# Patient Record
Sex: Female | Born: 1935
Health system: Southern US, Community
[De-identification: ages and names within clinical notes are randomized; demographics above are authoritative.]

## PROBLEM LIST (undated history)

## (undated) DIAGNOSIS — N289 Disorder of kidney and ureter, unspecified: Secondary | ICD-10-CM

## (undated) DIAGNOSIS — M353 Polymyalgia rheumatica: Secondary | ICD-10-CM

## (undated) DIAGNOSIS — E7800 Pure hypercholesterolemia, unspecified: Secondary | ICD-10-CM

## (undated) DIAGNOSIS — E119 Type 2 diabetes mellitus without complications: Secondary | ICD-10-CM

## (undated) DIAGNOSIS — I10 Essential (primary) hypertension: Secondary | ICD-10-CM

## (undated) HISTORY — DX: Pure hypercholesterolemia, unspecified: E78.00

## (undated) HISTORY — DX: Type 2 diabetes mellitus without complications: E11.9

## (undated) HISTORY — PX: ECTOPIC PREGNANCY SURGERY: SHX613

## (undated) HISTORY — DX: Polymyalgia rheumatica: M35.3

## (undated) HISTORY — PX: HERNIA REPAIR: SHX51

---

## 1997-08-07 ENCOUNTER — Other Ambulatory Visit: Admission: RE | Admit: 1997-08-07 | Discharge: 1997-08-07 | Payer: Self-pay | Admitting: Nephrology

## 1997-11-23 ENCOUNTER — Other Ambulatory Visit: Admission: RE | Admit: 1997-11-23 | Discharge: 1997-11-23 | Payer: Self-pay | Admitting: Nephrology

## 1999-03-10 ENCOUNTER — Emergency Department (HOSPITAL_COMMUNITY): Admission: EM | Admit: 1999-03-10 | Discharge: 1999-03-10 | Payer: Self-pay | Admitting: Emergency Medicine

## 1999-03-10 ENCOUNTER — Encounter: Payer: Self-pay | Admitting: Emergency Medicine

## 2000-11-06 ENCOUNTER — Other Ambulatory Visit: Admission: RE | Admit: 2000-11-06 | Discharge: 2000-11-06 | Payer: Self-pay | Admitting: Internal Medicine

## 2003-11-05 ENCOUNTER — Ambulatory Visit (HOSPITAL_COMMUNITY): Admission: RE | Admit: 2003-11-05 | Discharge: 2003-11-05 | Payer: Self-pay | Admitting: General Surgery

## 2004-03-04 ENCOUNTER — Ambulatory Visit (HOSPITAL_COMMUNITY): Admission: RE | Admit: 2004-03-04 | Discharge: 2004-03-04 | Payer: Self-pay | Admitting: Gastroenterology

## 2004-03-04 ENCOUNTER — Encounter (INDEPENDENT_AMBULATORY_CARE_PROVIDER_SITE_OTHER): Payer: Self-pay | Admitting: *Deleted

## 2004-08-09 ENCOUNTER — Encounter: Admission: RE | Admit: 2004-08-09 | Discharge: 2004-08-09 | Payer: Self-pay | Admitting: Internal Medicine

## 2004-09-30 ENCOUNTER — Ambulatory Visit (HOSPITAL_COMMUNITY): Admission: RE | Admit: 2004-09-30 | Discharge: 2004-09-30 | Payer: Self-pay | Admitting: Urology

## 2005-08-26 ENCOUNTER — Encounter: Admission: RE | Admit: 2005-08-26 | Discharge: 2005-08-26 | Payer: Self-pay | Admitting: Internal Medicine

## 2005-08-31 ENCOUNTER — Encounter: Admission: RE | Admit: 2005-08-31 | Discharge: 2005-08-31 | Payer: Self-pay | Admitting: Internal Medicine

## 2009-10-22 ENCOUNTER — Encounter: Admission: RE | Admit: 2009-10-22 | Discharge: 2009-10-22 | Payer: Self-pay | Admitting: Internal Medicine

## 2010-09-10 NOTE — Op Note (Signed)
NAME:  Rebekah Avila, Rebekah Avila NO.:  [id number]   MEDICAL RECORD NO.:  [id number]                   PATIENT TYPE:  OIB   LOCATION:  2899                                 FACILITY:  [facility]   PHYSICIAN:  Leonie Man, M.D.                DATE OF BIRTH:  07/14/1931   DATE OF PROCEDURE:  11/05/2003  DATE OF DISCHARGE:                                 OPERATIVE REPORT   PREOPERATIVE DIAGNOSIS:  Incarcerated epigastric hernia.   POSTOPERATIVE DIAGNOSIS:  Diastasis recti.   PROCEDURE:  Exploration of epigastric mass and repair of diastasis recti.   SURGEON:  Leonie Man, M.D.   ASSISTANT:  Nurse.   ANESTHESIA:  General.   INDICATIONS FOR PROCEDURE:  The patient is a 77 year old woman with  polymyalgia rheumatica.  She has been on some low dose prednisone in the  past.  She comes in with epigastric pain and a mass effect in the  epigastrium.  This feels like an incarcerated epigastric hernia and she  comes to the operating room for exploration.  She understands the risks and  potential benefits of surgery and gives consent.   DESCRIPTION OF PROCEDURE:  Following the induction of satisfactory general  anesthesia, the patient was positioned supinely.  The abdomen is prepped and  draped routinely and a midline incision is carried down over the epigastric  mass, deepened through the skin and subcutaneous tissues, and going down  there was no fat necrosis or any evidence of hernia sac.  The fascia was  somewhat weakened and distended inferior to the xiphoid and down to the  umbilicus.  There was no specific hernia noted.  I used interrupted #1  Novafil sutures to repair the diastasis by imbrication.  The sponge and  instrument counts were then verified and the subcutaneous tissues closed  with interrupted 0 Vicryl sutures.  The skin closed with a running 4-0  Monocryl suture and then reinforced with Steri-Strips.  A sterile dressing  is applied.  Anesthetic  reversed.  The patient removed from the operating  room to the recovery room in stable condition.  She tolerated this procedure  well.                                               Leonie Man, M.D.    PB/MEDQ  D:  11/05/2003  T:  11/05/2003  Job:  [id number]

## 2010-09-10 NOTE — Op Note (Signed)
Rebekah Avila, Rebekah Avila                 ACCOUNT NO.:  [id number]   MEDICAL RECORD NO.:  [id number]          PATIENT TYPE:  AMB   LOCATION:  ENDO                         FACILITY:  [facility]   PHYSICIAN:  Jordan Hawks. Elnoria Howard, MD    DATE OF BIRTH:  1931-09-01   DATE OF PROCEDURE:  03/04/2004  DATE OF DISCHARGE:                                 OPERATIVE REPORT   PROCEDURE:  Colonoscopy.   INDICATIONS FOR PROCEDURE:  Screening colonoscopy.   ENDOSCOPIST:  Jordan Hawks. Elnoria Howard, M.D.   EQUIPMENT USED:  Adult Olympus colonoscopy.   CONSENT:  Informed consent was obtained from the patient describing the  risks of bleeding, infection, perforation, medication reactions and a 10%  miss rate for a small colon cancer, a polyp and the risk of death; all of  which are not exclusive of any other complications that can occur.   PHYSICAL EXAMINATION:  CARDIOVASCULAR:  Regular rate and rhythm.  LUNGS:  Clear to auscultation bilaterally.  ABDOMEN:  Obese, soft, nontender and nondistended.   MEDICATIONS:  Versed 7.5 mg IV and Demerol 75 mg IV.   DESCRIPTION OF PROCEDURE:  After adequate sedation was achieved while in the  left lateral decubitus position, a rectal examination was performed which  was negative for any palpable abnormalities.  The colonoscope was then  introduced and advanced to the cecum under direct visualization.  This was a  moderately difficult colon to intubate the cecum.  The patient was noted to  have a good prep.  While in the cecum, photo documentation was obtained of  this area.  The patient was noted to have a 3 mm cecal polyp and then a  second polyp on the IC valve also measuring 3 mm, both of which were removed  and cold biopsied.  Upon withdrawal of the colonoscopy on close inspection  of the mucosa, there is no evidence of any polyps, masses, ulcerations,  inflammations, erosions or vascular abnormalities in the ascending,  transverse, descending or the sigmoid colon.  The patient is  noted to have  large diverticula in the transverse, descending and sigmoid colon.  Upon  retroflexion in the rectum, the patient is documented to have moderate size  internal and external hemorrhoids.   PLAN:  1.  Follow up on biopsies.  2.  Repeat colonoscopy in five years.  3.  Maintain high fiber diet.       PDH/MEDQ  D:  03/04/2004  T:  03/04/2004  Job:  [id number]   cc:   Candyce Churn. Allyne Gee, M.D.  [address]  [address]  [place]  [place] [address]  Fax: [phone]

## 2013-06-30 ENCOUNTER — Emergency Department (HOSPITAL_COMMUNITY): Payer: Medicare Other

## 2013-06-30 ENCOUNTER — Encounter (HOSPITAL_COMMUNITY): Payer: Self-pay | Admitting: Emergency Medicine

## 2013-06-30 ENCOUNTER — Inpatient Hospital Stay (HOSPITAL_COMMUNITY)
Admission: EM | Admit: 2013-06-30 | Discharge: 2013-07-01 | DRG: 556 | Disposition: A | Discharge status: Home / Self Care | Payer: Medicare Other | Attending: Internal Medicine | Admitting: Internal Medicine

## 2013-06-30 DIAGNOSIS — I10 Essential (primary) hypertension: Secondary | ICD-10-CM | POA: Diagnosis present

## 2013-06-30 DIAGNOSIS — R29898 Other symptoms and signs involving the musculoskeletal system: Principal | ICD-10-CM | POA: Diagnosis present

## 2013-06-30 DIAGNOSIS — G459 Transient cerebral ischemic attack, unspecified: Secondary | ICD-10-CM | POA: Diagnosis present

## 2013-06-30 DIAGNOSIS — R4789 Other speech disturbances: Secondary | ICD-10-CM | POA: Diagnosis present

## 2013-06-30 DIAGNOSIS — Z79899 Other long term (current) drug therapy: Secondary | ICD-10-CM

## 2013-06-30 DIAGNOSIS — E876 Hypokalemia: Secondary | ICD-10-CM | POA: Diagnosis present

## 2013-06-30 DIAGNOSIS — Z7982 Long term (current) use of aspirin: Secondary | ICD-10-CM

## 2013-06-30 HISTORY — DX: Essential (primary) hypertension: I10

## 2013-06-30 LAB — COMPREHENSIVE METABOLIC PANEL
ALT: 9 U/L (ref 0–35)
AST: 22 U/L (ref 0–37)
Albumin: 3.9 g/dL (ref 3.5–5.2)
Alkaline Phosphatase: 48 U/L (ref 39–117)
BUN: 12 mg/dL (ref 6–23)
CO2: 25 mEq/L (ref 19–32)
Calcium: 9.7 mg/dL (ref 8.4–10.5)
Chloride: 101 mEq/L (ref 96–112)
Creatinine, Ser: 0.98 mg/dL (ref 0.50–1.10)
GFR calc Af Amer: 62 mL/min — ABNORMAL LOW (ref >90)
GFR calc non Af Amer: 53 mL/min — ABNORMAL LOW (ref >90)
Glucose, Bld: 116 mg/dL — ABNORMAL HIGH (ref 70–99)
Potassium: 3.1 mEq/L — ABNORMAL LOW (ref 3.7–5.3)
Sodium: 141 mEq/L (ref 137–147)
Total Bilirubin: 0.5 mg/dL (ref 0.3–1.2)
Total Protein: 8 g/dL (ref 6.0–8.3)

## 2013-06-30 LAB — URINALYSIS, ROUTINE W REFLEX MICROSCOPIC
Bilirubin Urine: NEGATIVE
Glucose, UA: NEGATIVE mg/dL
Hgb urine dipstick: NEGATIVE
Ketones, ur: NEGATIVE mg/dL
Nitrite: NEGATIVE
Protein, ur: NEGATIVE mg/dL
Specific Gravity, Urine: 1.02 (ref 1.005–1.030)
Urobilinogen, UA: 0.2 mg/dL (ref 0.0–1.0)
pH: 6.5 (ref 5.0–8.0)

## 2013-06-30 LAB — RAPID URINE DRUG SCREEN, HOSP PERFORMED
Amphetamines: NOT DETECTED
Barbiturates: NOT DETECTED
Benzodiazepines: NOT DETECTED
Cocaine: NOT DETECTED
Opiates: NOT DETECTED
Tetrahydrocannabinol: NOT DETECTED

## 2013-06-30 LAB — CBC
HCT: 33 % — ABNORMAL LOW (ref 36.0–46.0)
Hemoglobin: 11.4 g/dL — ABNORMAL LOW (ref 12.0–15.0)
MCH: 29.8 pg (ref 26.0–34.0)
MCHC: 34.5 g/dL (ref 30.0–36.0)
MCV: 86.2 fL (ref 78.0–100.0)
Platelets: 262 10*3/uL (ref 150–400)
RBC: 3.83 MIL/uL — ABNORMAL LOW (ref 3.87–5.11)
RDW: 13.3 % (ref 11.5–15.5)
WBC: 5.4 10*3/uL (ref 4.0–10.5)

## 2013-06-30 LAB — URINE MICROSCOPIC-ADD ON

## 2013-06-30 LAB — I-STAT TROPONIN, ED: Troponin i, poc: 0.01 ng/mL (ref 0.00–0.08)

## 2013-06-30 MED ORDER — HYDROCHLOROTHIAZIDE 25 MG PO TABS
25.0000 mg | ORAL_TABLET | Freq: Every day | ORAL | Status: DC
  Administered 2013-07-01: 25 mg via ORAL
  Filled 2013-06-30 (×2): qty 1

## 2013-06-30 MED ORDER — HEPARIN SODIUM (PORCINE) 5000 UNIT/ML IJ SOLN
5000.0000 [IU] | Freq: Three times a day (TID) | INTRAMUSCULAR | Status: DC
  Administered 2013-06-30 – 2013-07-01 (×2): 5000 [IU] via SUBCUTANEOUS
  Filled 2013-06-30 (×5): qty 1

## 2013-06-30 MED ORDER — ACETAMINOPHEN 325 MG PO TABS
650.0000 mg | ORAL_TABLET | Freq: Four times a day (QID) | ORAL | Status: DC | PRN
  Administered 2013-06-30: 650 mg via ORAL
  Filled 2013-06-30: qty 2

## 2013-06-30 MED ORDER — ONDANSETRON HCL 4 MG/2ML IJ SOLN: 4.0000 mg | Freq: Four times a day (QID) | INTRAMUSCULAR | Status: DC | PRN

## 2013-06-30 MED ORDER — ACETAMINOPHEN 650 MG RE SUPP
650.0000 mg | Freq: Four times a day (QID) | RECTAL | Status: DC | PRN
Start: 2013-06-30 — End: 2013-07-01

## 2013-06-30 MED ORDER — IRBESARTAN 75 MG PO TABS
75.0000 mg | ORAL_TABLET | Freq: Every day | ORAL | Status: DC
  Administered 2013-06-30 – 2013-07-01 (×2): 75 mg via ORAL
  Filled 2013-06-30 (×2): qty 1

## 2013-06-30 MED ORDER — POLYETHYLENE GLYCOL 3350 17 G PO PACK
17.0000 g | PACK | Freq: Every day | ORAL | Status: DC | PRN
  Filled 2013-06-30: qty 1

## 2013-06-30 MED ORDER — SODIUM CHLORIDE 0.9 % IJ SOLN
3.0000 mL | Freq: Two times a day (BID) | INTRAMUSCULAR | Status: DC
  Administered 2013-06-30 – 2013-07-01 (×3): 3 mL via INTRAVENOUS

## 2013-06-30 MED ORDER — ASPIRIN EC 325 MG PO TBEC
325.0000 mg | DELAYED_RELEASE_TABLET | Freq: Every day | ORAL | Status: DC
  Administered 2013-06-30 – 2013-07-01 (×2): 325 mg via ORAL
  Filled 2013-06-30 (×2): qty 1

## 2013-06-30 MED ORDER — OMEGA-3-ACID ETHYL ESTERS 1 G PO CAPS
1.0000 g | ORAL_CAPSULE | Freq: Every day | ORAL | Status: DC
  Administered 2013-06-30 – 2013-07-01 (×2): 1 g via ORAL
  Filled 2013-06-30 (×2): qty 1

## 2013-06-30 MED ORDER — POTASSIUM CHLORIDE CRYS ER 20 MEQ PO TBCR
40.0000 meq | EXTENDED_RELEASE_TABLET | Freq: Two times a day (BID) | ORAL | Status: AC
  Administered 2013-06-30 (×2): 40 meq via ORAL
  Filled 2013-06-30 (×2): qty 2

## 2013-06-30 MED ORDER — METOPROLOL TARTRATE 50 MG PO TABS
50.0000 mg | ORAL_TABLET | Freq: Two times a day (BID) | ORAL | Status: DC
  Administered 2013-06-30 – 2013-07-01 (×3): 50 mg via ORAL
  Filled 2013-06-30 (×4): qty 1

## 2013-06-30 MED ORDER — FISH OIL OIL: 1.0000 | TOPICAL_OIL | Freq: Every day | Status: DC

## 2013-06-30 MED ORDER — FLUTICASONE PROPIONATE 50 MCG/ACT NA SUSP: 1.0000 | Freq: Every day | NASAL | Status: DC | PRN

## 2013-06-30 MED ORDER — ONDANSETRON HCL 4 MG PO TABS: 4.0000 mg | ORAL_TABLET | Freq: Four times a day (QID) | ORAL | Status: DC | PRN

## 2013-06-30 MED ORDER — ATORVASTATIN CALCIUM 20 MG PO TABS
20.0000 mg | ORAL_TABLET | Freq: Every day | ORAL | Status: DC
Start: 2013-06-30 — End: 2013-07-01
  Administered 2013-06-30: 20 mg via ORAL
  Filled 2013-06-30 (×2): qty 1

## 2013-06-30 NOTE — ED Notes (Signed)
Attempt to call report, nurse with return call

## 2013-06-30 NOTE — ED Notes (Signed)
Pt returns to room 

## 2013-06-30 NOTE — ED Notes (Signed)
Lab bedside.

## 2013-06-30 NOTE — ED Provider Notes (Addendum)
CSN: [id number]     Arrival date & time 06/30/13  1207 History   First MD Initiated Contact with Patient 06/30/13 1224     Chief Complaint  Patient presents with  . Numbness     (Consider location/radiation/quality/duration/timing/severity/associated sxs/prior Treatment) Patient is a 80 y.o. female presenting with weakness. The history is provided by the patient.  Weakness This is a new problem. The current episode started 1 to 2 hours ago. The problem occurs constantly. The problem has been resolved. Pertinent negatives include no chest pain, no headaches and no shortness of breath. Associated symptoms comments: Pt states when she woke up this a.m. She had left arm numbness, left face numbness and her husband told her she had left facial droop and mild slurred speech.  Denies gait issues and no c/o of aphasia.  Also noted today while this was happening that blood pressure has been elevated.  No change in meds and has taken all bp meds.. Nothing aggravates the symptoms. Nothing relieves the symptoms. She has tried nothing for the symptoms. The treatment provided significant relief.    Past Medical History  Diagnosis Date  . Hypertension    Past Surgical History  Procedure Laterality Date  . Hernia repair    . Ectopic pregnancy surgery     No family history on file. History  Substance Use Topics  . Smoking status: Never Smoker   . Smokeless tobacco: Not on file  . Alcohol Use: No   OB History   Grav Para Term Preterm Abortions TAB SAB Ect Mult Living                 Review of Systems  Respiratory: Negative for cough and shortness of breath.   Cardiovascular: Negative for chest pain and leg swelling.  Neurological: Positive for facial asymmetry, speech difficulty, weakness and numbness. Negative for headaches.       Slurred speech  All other systems reviewed and are negative.      Allergies  Morphine and related  Home Medications   Current Outpatient Rx  Name  Route   Sig  Dispense  Refill  . acetaminophen (TYLENOL) 500 MG tablet   Oral   Take 1,000 mg by mouth every 6 (six) hours as needed for moderate pain.         Marland Kitchen. aspirin EC 81 MG tablet   Oral   Take 81 mg by mouth daily.         . Fish Oil OIL   Oral   Take 1 capsule by mouth daily.         . fluticasone (FLONASE) 50 MCG/ACT nasal spray   Each Nare   Place 1 spray into both nostrils daily as needed for allergies or rhinitis.         . hydrochlorothiazide (HYDRODIURIL) 25 MG tablet   Oral   Take 25 mg by mouth daily.         . metoprolol (LOPRESSOR) 50 MG tablet   Oral   Take 50 mg by mouth 2 (two) times daily.         Marland Kitchen. olmesartan (BENICAR) 40 MG tablet   Oral   Take 40 mg by mouth daily.         . rosuvastatin (CRESTOR) 20 MG tablet   Oral   Take 20 mg by mouth every Wednesday.          BP 175/88  Pulse 73  Temp(Src) 98.3 F (36.8 C) (Oral)  Resp  16  SpO2 97% Physical Exam  Nursing note and vitals reviewed. Constitutional: She is oriented to person, place, and time. She appears well-developed and well-nourished. No distress.  HENT:  Head: Normocephalic and atraumatic.  Mouth/Throat: Oropharynx is clear and moist.  Eyes: Conjunctivae and EOM are normal. Pupils are equal, round, and reactive to light.  Neck: Normal range of motion. Neck supple. Carotid bruit is not present.  Cardiovascular: Normal rate, regular rhythm and intact distal pulses.   No murmur heard. Normal pulses in all ext  Pulmonary/Chest: Effort normal and breath sounds normal. No respiratory distress. She has no wheezes. She has no rales.  Abdominal: Soft. She exhibits no distension. There is no tenderness. There is no rebound and no guarding.  Musculoskeletal: Normal range of motion. She exhibits no edema and no tenderness.  Neurological: She is alert and oriented to person, place, and time. She has normal strength. No cranial nerve deficit or sensory deficit. Coordination and gait  normal.  Normal speech and movements are purposeful.  Normal finger to nose and no visual field cuts.    Skin: Skin is warm and dry. No rash noted. No erythema.  Psychiatric: She has a normal mood and affect. Her behavior is normal.    ED Course  Procedures (including critical care time) Labs Review Labs Reviewed  CBC - Abnormal; Notable for the following:    RBC 3.83 (*)    Hemoglobin 11.4 (*)    HCT 33.0 (*)    All other components within normal limits  COMPREHENSIVE METABOLIC PANEL - Abnormal; Notable for the following:    Potassium 3.1 (*)    Glucose, Bld 116 (*)    GFR calc non Af Amer 53 (*)    GFR calc Af Amer 62 (*)    All other components within normal limits  URINALYSIS, ROUTINE W REFLEX MICROSCOPIC - Abnormal; Notable for the following:    Leukocytes, UA MODERATE (*)    All other components within normal limits  URINE RAPID DRUG SCREEN (HOSP PERFORMED)  URINE MICROSCOPIC-ADD ON  Rosezena Sensor, ED   Imaging Review Dg Chest 2 View  06/30/2013   CLINICAL DATA:  Recent dyspnea and chest pain  EXAM: CHEST  2 VIEW  COMPARISON:  DG CHEST 2 VIEW dated 09/29/2004  FINDINGS: The lungs are adequately inflated. There is no focal infiltrate. No pulmonary parenchymal nodules or masses are demonstrated. The cardiopericardial silhouette is top-normal in size. The pulmonary vascularity is not engorged. The mediastinum is normal in width. There is no pleural effusion or pneumothorax. The observed portions of the bony thorax exhibit no acute abnormalities.  IMPRESSION: There is no evidence of active cardiopulmonary disease.   Electronically Signed   By: David  [place]   On: 06/30/2013 13:22   Ct Head Wo Contrast  06/30/2013   CLINICAL DATA:  Left arm numbness.  EXAM: CT HEAD WITHOUT CONTRAST  TECHNIQUE: Contiguous axial images were obtained from the base of the skull through the vertex without intravenous contrast.  COMPARISON:  None.  FINDINGS: Mild low-density throughout the deep white  matter compatible with chronic microvascular disease. No acute intracranial abnormality. Specifically, no hemorrhage, hydrocephalus, mass lesion, acute infarction, or significant intracranial injury. No acute calvarial abnormality. Visualized paranasal sinuses and mastoids clear. Orbital soft tissues unremarkable.  IMPRESSION: Mild chronic small vessel disease.  No acute intracranial abnormality.   Electronically Signed   By: Charlett Nose M.D.   On: 06/30/2013 13:10     EKG Interpretation   Date/Time:  Sunday June 30 2013 12:55:40 EDT Ventricular Rate:  70 PR Interval:  207 QRS Duration: 75 QT Interval:  400 QTC Calculation: 432 R Axis:   11 Text Interpretation:  Sinus rhythm Low voltage, precordial leads  Nonspecific T abnormalities, anterior leads Baseline wander in lead(s) V6  No significant change since last tracing Confirmed by Anitra Lauth  MD,  Alphonzo Lemmings ([id number]) on 06/30/2013 1:01:01 PM      MDM   Final diagnoses:  TIA (transient ischemic attack)   Patient with a history today is concerning for a TIA. She is complaining of left arm numbness, heaviness and left-sided facial droop with slurred speech that lasted less than 1 hour. Patient noted to have high blood pressure today. She states her blood pressure typically runs between 05/25/1936 and today has been 170-190. She currently states all of her symptoms have resolved. She has no focal neuro deficits on exam. She denies any chest pain, pressure or shortness of breath. She has a history of hypertension but no history of being a smoker or diabetes. No prior history of stroke.  Patient will get a TIA workup.  1:16 PM CT of head neg.  CXR wnl.  EKG unchanged from prior.  Labs pending.   1:41 PM Labs wnl.  Discussed with pt and feel she needs a TIA work up given she is moderate risk of recurrent stroke.   Gwyneth Sprout, MD 06/30/13 1355  Gwyneth Sprout, MD 06/30/13 1355

## 2013-06-30 NOTE — H&P (Signed)
[facility] History and Physical  Rebekah KidaWanda V Thune UJW:[id number] DOB: 07/07/1931 DOA: 06/30/2013  Referring physician: Emergency Department PCP: No primary provider on file.  Specialists:   Chief Complaint: Weakness  HPI: Rebekah Avila is a 80 y.o. female  With a hx of htn who presents to the hospital after awakening this AM with L sided weakness and slurred speech. Symptoms fully resolved several hours later. The patient was noted to have no acute process on noncontrast head CT. Given concerns of TIA, the hospitalists were consulted for consideration for admission. At present, the patient remains at baseline. No complaints currently  Review of Systems:  Per above, the remainder of the 10pt ros reviewed and are neg  Past Medical History  Diagnosis Date  . Hypertension    Past Surgical History  Procedure Laterality Date  . Hernia repair    . Ectopic pregnancy surgery     Social History:  reports that she has never smoked. She does not have any smokeless tobacco history on file. She reports that she does not drink alcohol. Her drug history is not on file.  where does patient live--home, ALF, SNF? and with whom if at home?  Can patient participate in ADLs?  Allergies  Allergen Reactions  . Morphine And Related Rash    No family history on file.  (be sure to complete)  Prior to Admission medications   Medication Sig Start Date End Date Taking? Authorizing Provider  acetaminophen (TYLENOL) 500 MG tablet Take 1,000 mg by mouth every 6 (six) hours as needed for moderate pain.   Yes Historical Provider, MD  aspirin EC 81 MG tablet Take 81 mg by mouth daily.   Yes Historical Provider, MD  Fish Oil OIL Take 1 capsule by mouth daily.   Yes Historical Provider, MD  fluticasone (FLONASE) 50 MCG/ACT nasal spray Place 1 spray into both nostrils daily as needed for allergies or rhinitis.   Yes Historical Provider, MD  hydrochlorothiazide (HYDRODIURIL) 25 MG tablet Take 25 mg by mouth  daily.   Yes Historical Provider, MD  metoprolol (LOPRESSOR) 50 MG tablet Take 50 mg by mouth 2 (two) times daily.   Yes Historical Provider, MD  olmesartan (BENICAR) 40 MG tablet Take 40 mg by mouth daily.   Yes Historical Provider, MD  rosuvastatin (CRESTOR) 20 MG tablet Take 20 mg by mouth every Wednesday.   Yes Historical Provider, MD   Physical Exam: Filed Vitals:   06/30/13 1220 06/30/13 1354  BP: 175/88 140/68  Pulse: 73   Temp: 98.3 F (36.8 C)   TempSrc: Oral   Resp: 16   SpO2: 97%      General:  Awake, in nad  Eyes: PERRL B  ENT: membranes moist, dentition fair  Neck: trachea midline, neck supple  Cardiovascular: regular, s1, s2  Respiratory: normal resp effort, no wheezing  Abdomen: soft, nondsitended  Skin: normal skin turgor, no abnormal skin lesions seen  Musculoskeletal: perfused, no clubbing  Psychiatric: mood/affect normal // no auditory/visual hallucinations  Neurologic: cn2-12 intact, strength/sensation intact  Labs on Admission:  Basic Metabolic Panel:  Recent Labs Lab 06/30/13 1302  NA 141  K 3.1*  CL 101  CO2 25  GLUCOSE 116*  BUN 12  CREATININE 0.98  CALCIUM 9.7   Liver Function Tests:  Recent Labs Lab 06/30/13 1302  AST 22  ALT 9  ALKPHOS 48  BILITOT 0.5  PROT 8.0  ALBUMIN 3.9   No results found for this basename: LIPASE, AMYLASE,  in the last 168 hours No results found for this basename: AMMONIA,  in the last 168 hours CBC:  Recent Labs Lab 06/30/13 1302  WBC 5.4  HGB 11.4*  HCT 33.0*  MCV 86.2  PLT 262   Cardiac Enzymes: No results found for this basename: CKTOTAL, CKMB, CKMBINDEX, TROPONINI,  in the last 168 hours  BNP (last 3 results) No results found for this basename: PROBNP,  in the last 8760 hours CBG: No results found for this basename: GLUCAP,  in the last 168 hours  Radiological Exams on Admission: Dg Chest 2 View  06/30/2013   CLINICAL DATA:  Recent dyspnea and chest pain  EXAM: CHEST  2 VIEW   COMPARISON:  DG CHEST 2 VIEW dated 09/29/2004  FINDINGS: The lungs are adequately inflated. There is no focal infiltrate. No pulmonary parenchymal nodules or masses are demonstrated. The cardiopericardial silhouette is top-normal in size. The pulmonary vascularity is not engorged. The mediastinum is normal in width. There is no pleural effusion or pneumothorax. The observed portions of the bony thorax exhibit no acute abnormalities.  IMPRESSION: There is no evidence of active cardiopulmonary disease.   Electronically Signed   By: David  [place]   On: 06/30/2013 13:22   Ct Head Wo Contrast  06/30/2013   CLINICAL DATA:  Left arm numbness.  EXAM: CT HEAD WITHOUT CONTRAST  TECHNIQUE: Contiguous axial images were obtained from the base of the skull through the vertex without intravenous contrast.  COMPARISON:  None.  FINDINGS: Mild low-density throughout the deep white matter compatible with chronic microvascular disease. No acute intracranial abnormality. Specifically, no hemorrhage, hydrocephalus, mass lesion, acute infarction, or significant intracranial injury. No acute calvarial abnormality. Visualized paranasal sinuses and mastoids clear. Orbital soft tissues unremarkable.  IMPRESSION: Mild chronic small vessel disease.  No acute intracranial abnormality.   Electronically Signed   By: Charlett Nose M.D.   On: 06/30/2013 13:10    Assessment/Plan Principal Problem:   TIA (transient ischemic attack) Active Problems:   HTN (hypertension)   1. TIA 1. Will check 2d echo, MRI brain, carotid dopplers 2. Monitor on tele 3. Admit to med-tele 4. Cont with ASA  2. HTN 1. Stable 2. Cont home meds 3. DVT prophylaxis 1. Heparin subQ 4. Hypokalemia 1. Will replace  Code Status: Full (must indicate code status--if unknown or must be presumed, indicate so) Family Communication: Pt in room (indicate person spoken with, if applicable, with phone number if by telephone) Disposition Plan: Pending (indicate  anticipated LOS)  Time spent:  Dashel Goines K [facility] Pager [phone]  If 7PM-7AM, please contact night-coverage [email or web address] Password [facility] 06/30/2013, 2:11 PM

## 2013-06-30 NOTE — ED Notes (Signed)
She states that upon awakening this morning, she noted her left arm to be "numb"; which persists.  She is able to complete r.o.m. With all fingers of left hand; and she denies any pain.

## 2013-06-30 NOTE — ED Notes (Signed)
Patient transported to CT 

## 2013-07-01 ENCOUNTER — Inpatient Hospital Stay (HOSPITAL_COMMUNITY): Payer: Medicare Other

## 2013-07-01 DIAGNOSIS — I517 Cardiomegaly: Secondary | ICD-10-CM

## 2013-07-01 LAB — COMPREHENSIVE METABOLIC PANEL
ALT: 7 U/L (ref 0–35)
AST: 19 U/L (ref 0–37)
Albumin: 3.3 g/dL — ABNORMAL LOW (ref 3.5–5.2)
Alkaline Phosphatase: 44 U/L (ref 39–117)
BUN: 13 mg/dL (ref 6–23)
CO2: 24 mEq/L (ref 19–32)
Calcium: 9.1 mg/dL (ref 8.4–10.5)
Chloride: 106 mEq/L (ref 96–112)
Creatinine, Ser: 0.98 mg/dL (ref 0.50–1.10)
GFR calc Af Amer: 62 mL/min — ABNORMAL LOW (ref >90)
GFR calc non Af Amer: 53 mL/min — ABNORMAL LOW (ref >90)
Glucose, Bld: 112 mg/dL — ABNORMAL HIGH (ref 70–99)
Potassium: 3.7 mEq/L (ref 3.7–5.3)
Sodium: 143 mEq/L (ref 137–147)
Total Bilirubin: 0.5 mg/dL (ref 0.3–1.2)
Total Protein: 7.5 g/dL (ref 6.0–8.3)

## 2013-07-01 LAB — CBC
HCT: 32.4 % — ABNORMAL LOW (ref 36.0–46.0)
Hemoglobin: 10.9 g/dL — ABNORMAL LOW (ref 12.0–15.0)
MCH: 29.3 pg (ref 26.0–34.0)
MCHC: 33.6 g/dL (ref 30.0–36.0)
MCV: 87.1 fL (ref 78.0–100.0)
Platelets: 239 10*3/uL (ref 150–400)
RBC: 3.72 MIL/uL — ABNORMAL LOW (ref 3.87–5.11)
RDW: 13.4 % (ref 11.5–15.5)
WBC: 6.6 10*3/uL (ref 4.0–10.5)

## 2013-07-01 MED ORDER — STROKE: EARLY STAGES OF RECOVERY BOOK
Freq: Once | Status: AC
  Administered 2013-07-01: 14:00:00
  Filled 2013-07-01: qty 1

## 2013-07-01 MED ORDER — LISINOPRIL 2.5 MG PO TABS: 2.5000 mg | ORAL_TABLET | Freq: Every day | ORAL | Status: DC

## 2013-07-01 NOTE — Discharge Summary (Signed)
Physician Discharge Summary  Rebekah Avila EAV:[id number] DOB: [birth year] DOA: 06/30/2013  PCP: No primary provider on file.  Admit date: 06/30/2013 Discharge date: 07/01/2013  Time spent: 40 minutes  Recommendations for Outpatient Follow-up:  1. Follow up with PCP in 1-2 weeks  Discharge Diagnoses:  Principal Problem:   TIA (transient ischemic attack) Active Problems:   HTN (hypertension)   Discharge Condition: Stable  Diet recommendation: Regular  Filed Weights   06/30/13 1551  Weight: 70.1 kg (154 lb 8.7 oz)    History of present illness:  Rebekah Avila is a 80 y.o. female  With a hx of htn who presents to the hospital after awakening this AM with L sided weakness and slurred speech. Symptoms fully resolved several hours later. The patient was noted to have no acute process on noncontrast head CT. Given concerns of TIA, the hospitalists were consulted for consideration for admission. At present, the patient remains at baseline. No complaints currently  Hospital Course:  The patient was admitted to the floor. MRI of the brain was neg for old or new stroke. Carotid dopplers were unremarkable. A 2d echo was done which was positive for grade 1 diastolic dysfunction and will be discharged on a low dose ACEI as a result. The patient otherwise remained medically stable for discharge.   Discharge Exam: Filed Vitals:   06/30/13 1556 06/30/13 2236 07/01/13 0157 07/01/13 0549  BP: 164/87 154/69 119/65 129/67  Pulse: 65 71 63 65  Temp: 97.6 F (36.4 C) 98.3 F (36.8 C) 98 F (36.7 C) 98.2 F (36.8 C)  TempSrc: Oral Oral Oral Oral  Resp: 18 20 20 12   Height:      Weight:      SpO2: 99% 100% 100% 100%    General: Awake, in nad Cardiovascular: regular, s1, s2 Respiratory: normal resp effort  Discharge Instructions     Medication List         acetaminophen 500 MG tablet  Commonly known as:  TYLENOL  Take 1,000 mg by mouth every 6 (six) hours as needed for moderate pain.      aspirin EC 81 MG tablet  Take 81 mg by mouth daily.     Fish Oil Oil  Take 1 capsule by mouth daily.     fluticasone 50 MCG/ACT nasal spray  Commonly known as:  FLONASE  Place 1 spray into both nostrils daily as needed for allergies or rhinitis.     hydrochlorothiazide 25 MG tablet  Commonly known as:  HYDRODIURIL  Take 25 mg by mouth daily.     lisinopril 2.5 MG tablet  Commonly known as:  PRINIVIL,ZESTRIL  Take 1 tablet (2.5 mg total) by mouth daily.     metoprolol 50 MG tablet  Commonly known as:  LOPRESSOR  Take 50 mg by mouth 2 (two) times daily.     olmesartan 40 MG tablet  Commonly known as:  BENICAR  Take 40 mg by mouth daily.     rosuvastatin 20 MG tablet  Commonly known as:  CRESTOR  Take 20 mg by mouth every Wednesday.       Allergies  Allergen Reactions  . Morphine And Related Rash   Follow-up Information   Schedule an appointment as soon as possible for a visit with Follow up with PCP in 1-2 weeks.       The results of significant diagnostics from this hospitalization (including imaging, microbiology, ancillary and laboratory) are listed below for reference.    Significant Diagnostic  Studies: Dg Chest 2 View  06/30/2013   CLINICAL DATA:  Recent dyspnea and chest pain  EXAM: CHEST  2 VIEW  COMPARISON:  DG CHEST 2 VIEW dated 09/29/2004  FINDINGS: The lungs are adequately inflated. There is no focal infiltrate. No pulmonary parenchymal nodules or masses are demonstrated. The cardiopericardial silhouette is top-normal in size. The pulmonary vascularity is not engorged. The mediastinum is normal in width. There is no pleural effusion or pneumothorax. The observed portions of the bony thorax exhibit no acute abnormalities.  IMPRESSION: There is no evidence of active cardiopulmonary disease.   Electronically Signed   By: David  [place]   On: 06/30/2013 13:22   Ct Head Wo Contrast  06/30/2013   CLINICAL DATA:  Left arm numbness.  EXAM: CT HEAD WITHOUT CONTRAST   TECHNIQUE: Contiguous axial images were obtained from the base of the skull through the vertex without intravenous contrast.  COMPARISON:  None.  FINDINGS: Mild low-density throughout the deep white matter compatible with chronic microvascular disease. No acute intracranial abnormality. Specifically, no hemorrhage, hydrocephalus, mass lesion, acute infarction, or significant intracranial injury. No acute calvarial abnormality. Visualized paranasal sinuses and mastoids clear. Orbital soft tissues unremarkable.  IMPRESSION: Mild chronic small vessel disease.  No acute intracranial abnormality.   Electronically Signed   By: Charlett Nose M.D.   On: 06/30/2013 13:10   Mr Brain Wo Contrast  07/01/2013   CLINICAL DATA:  Right-sided weakness and slurred speech.  EXAM: MRI HEAD WITHOUT CONTRAST  TECHNIQUE: Multiplanar, multiecho pulse sequences of the brain and surrounding structures were obtained without intravenous contrast.  COMPARISON:  Head CT 06/30/2013  FINDINGS: Diffusion imaging does not show any acute or subacute infarction. There are mild chronic small-vessel changes affecting the pons. There are a few old small vessel cerebellar infarctions. There chronic small-vessel changes affecting the thalami and basal ganglia. There are confluent chronic small vessel changes throughout the cerebral hemispheric white matter. No cortical or large vessel territory infarction. No mass lesion, hemorrhage, hydrocephalus or extra-axial collection. No sinus disease. No skull or skullbase lesion.  IMPRESSION: No acute or subacute infarction.  Chronic small vessel ischemic changes elsewhere throughout the brain as outlined above.   Electronically Signed   By: Paulina Fusi M.D.   On: 07/01/2013 07:33    Microbiology: No results found for this or any previous visit (from the past 240 hour(s)).   Labs: Basic Metabolic Panel:  Recent Labs Lab 06/30/13 1302 07/01/13 0355  NA 141 143  K 3.1* 3.7  CL 101 106  CO2 25 24   GLUCOSE 116* 112*  BUN 12 13  CREATININE 0.98 0.98  CALCIUM 9.7 9.1   Liver Function Tests:  Recent Labs Lab 06/30/13 1302 07/01/13 0355  AST 22 19  ALT 9 7  ALKPHOS 48 44  BILITOT 0.5 0.5  PROT 8.0 7.5  ALBUMIN 3.9 3.3*   No results found for this basename: LIPASE, AMYLASE,  in the last 168 hours No results found for this basename: AMMONIA,  in the last 168 hours CBC:  Recent Labs Lab 06/30/13 1302 07/01/13 0355  WBC 5.4 6.6  HGB 11.4* 10.9*  HCT 33.0* 32.4*  MCV 86.2 87.1  PLT 262 239   Cardiac Enzymes: No results found for this basename: CKTOTAL, CKMB, CKMBINDEX, TROPONINI,  in the last 168 hours BNP: BNP (last 3 results) No results found for this basename: PROBNP,  in the last 8760 hours CBG: No results found for this basename: GLUCAP,  in the  last 168 hours  Signed:  CHIU, STEPHEN K  [facility] 07/01/2013, 1:28 PM

## 2013-07-01 NOTE — Progress Notes (Signed)
*  PRELIMINARY RESULTS* Vascular Ultrasound Carotid Duplex (Doppler) has been completed.  Preliminary findings: Bilateral:  1-39% ICA stenosis.  Vertebral artery flow is antegrade.      Farrel DemarkJill Eunice, RDMS, RVT  07/01/2013, 9:12 AM

## 2013-07-01 NOTE — Progress Notes (Signed)
Pt had 2 strips of 19 beats of SVT, check on pt. Pt was asleep and appeared to be in no distress. Obtained vital signs. Temp- 98.0 BP- 119/65  Pulse- 63  Resp- 20  O2- 100% RA.  Merdis DelayK. Schorr, NP notified. No new orders at this time. Will continue to monitor.

## 2013-07-01 NOTE — Progress Notes (Signed)
Echocardiogram 2D Echocardiogram has been performed.  Jamaury Gumz 07/01/2013, 9:48 AM

## 2014-10-08 IMAGING — CR DG CHEST 2V
2 series · 2 of 2 positions shown · non-contrast
Comparison: DG CHEST 2 VIEW dated 09/29/2004

CLINICAL DATA: Recent dyspnea and chest pain

EXAM:
CHEST  2 VIEW

[w chest pa]
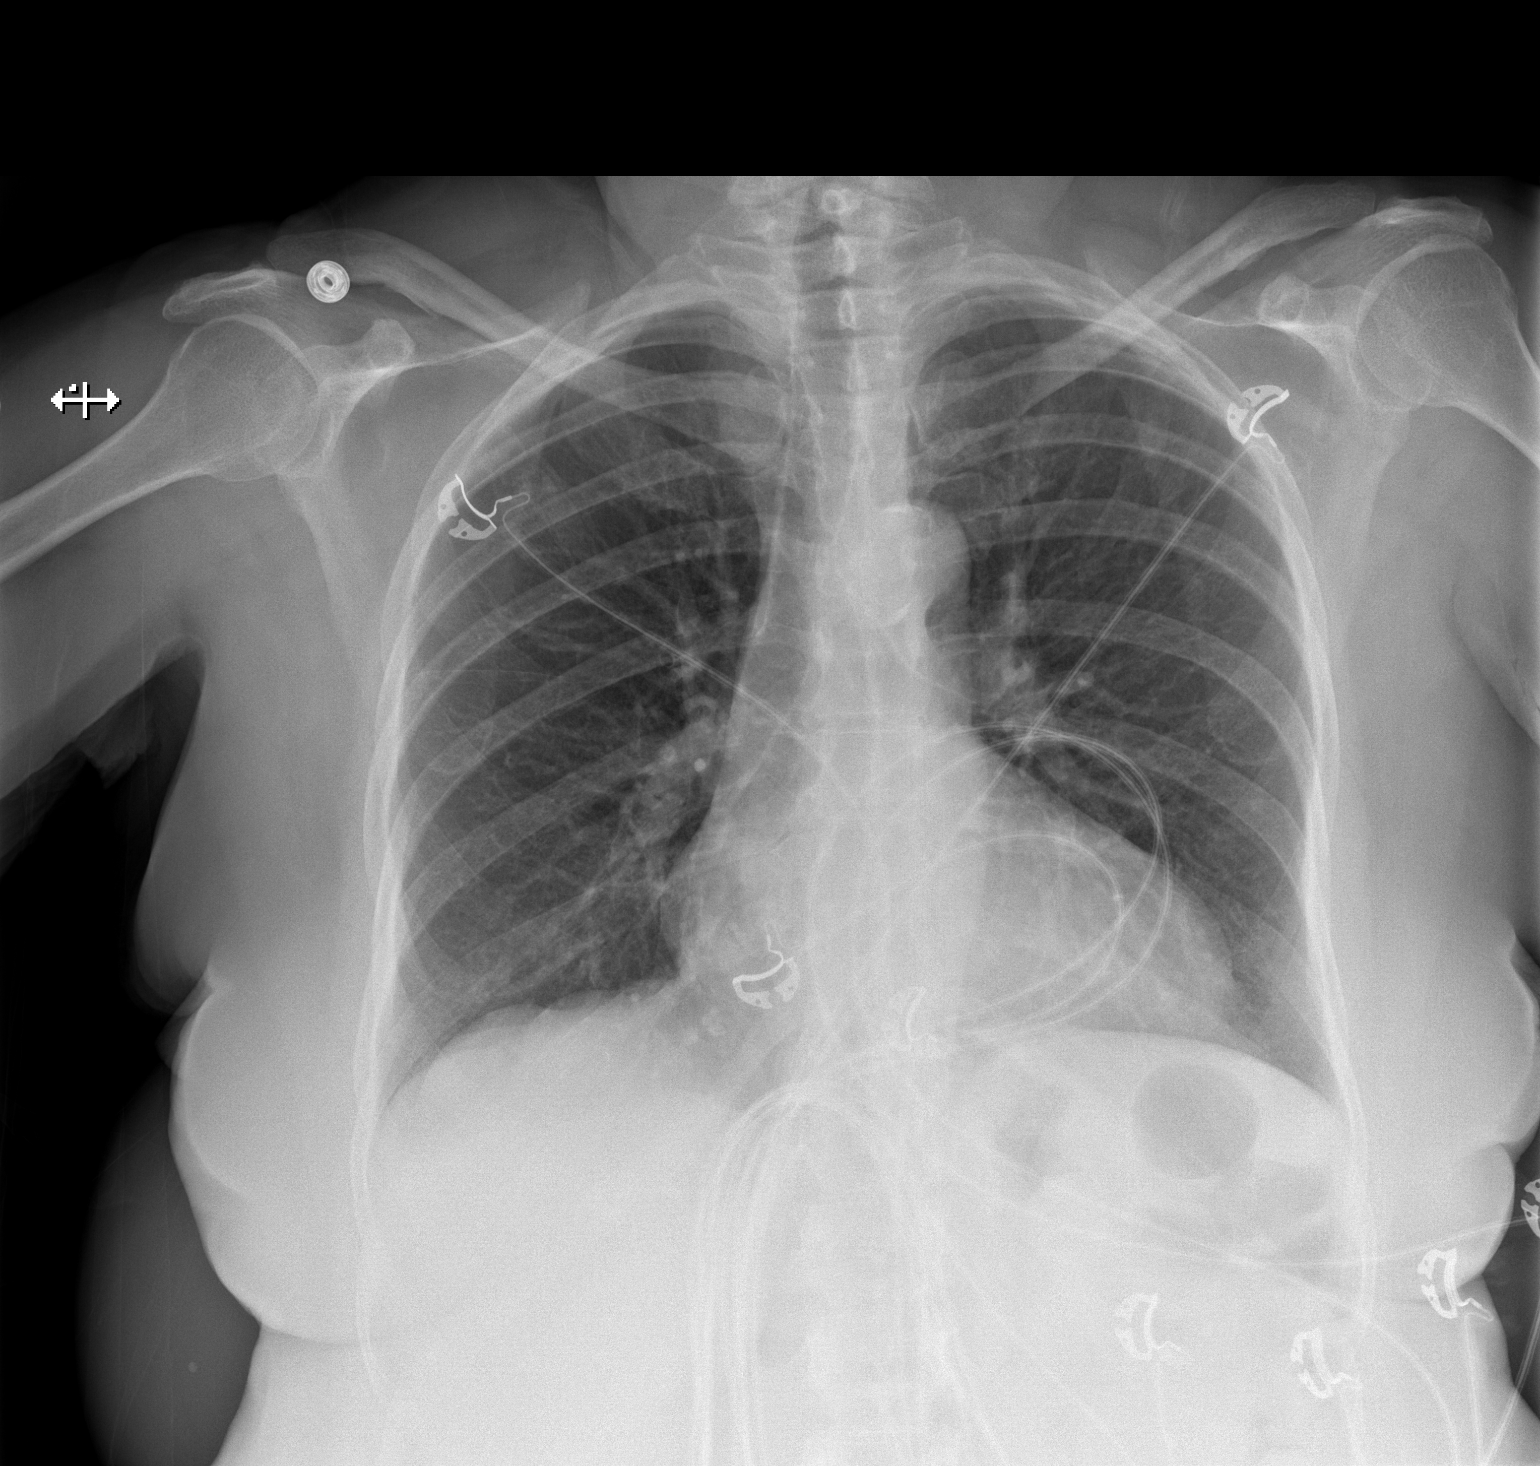

[w chest lat]
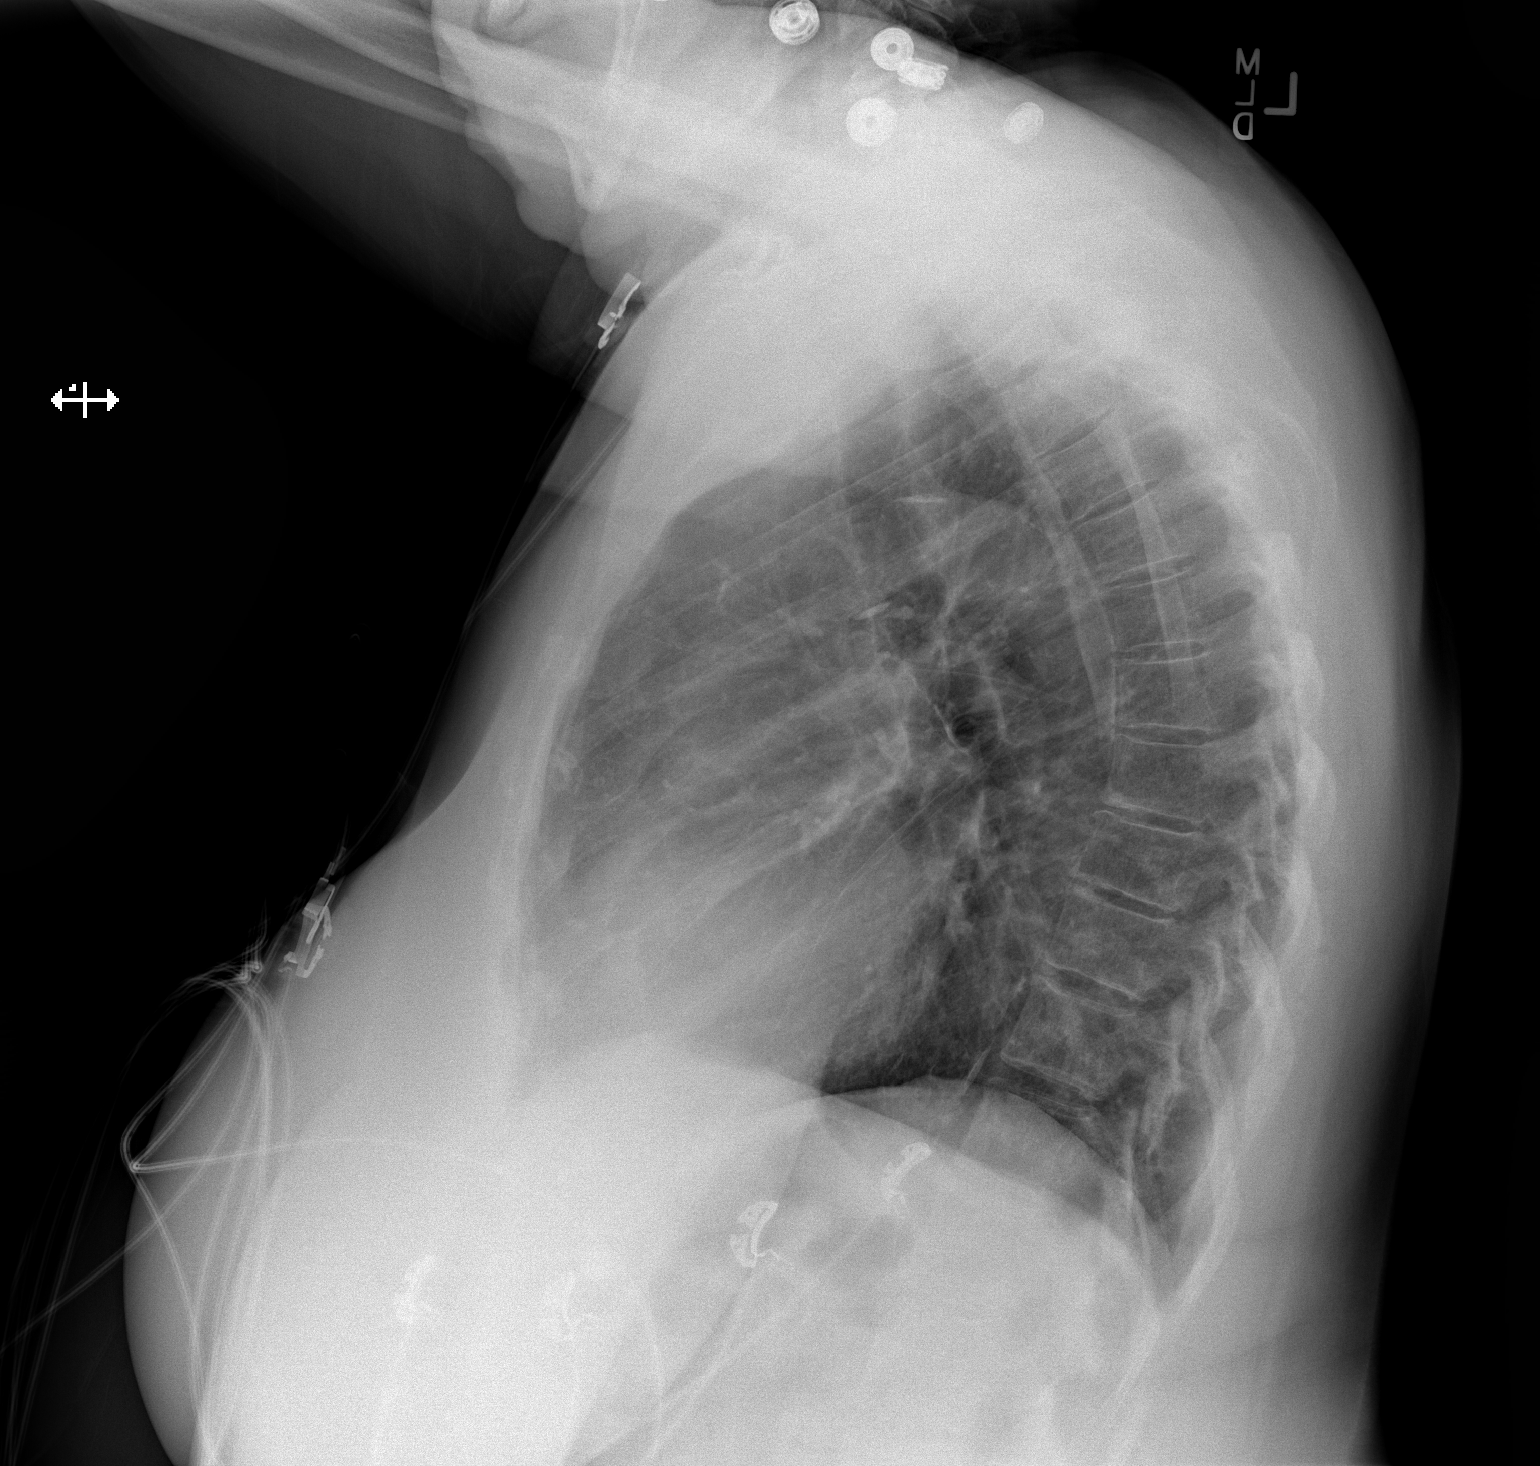

[2 of 2 positions shown; findings below may reference images not displayed]

FINDINGS: The lungs are adequately inflated. There is no focal infiltrate. No
pulmonary parenchymal nodules or masses are demonstrated. The
cardiopericardial silhouette is top-normal in size. The pulmonary
vascularity is not engorged. The mediastinum is normal in width.
There is no pleural effusion or pneumothorax. The observed portions
of the bony thorax exhibit no acute abnormalities.
IMPRESSION: There is no evidence of active cardiopulmonary disease.

## 2014-10-08 IMAGING — CT CT HEAD W/O CM
2 series · 16 of 30 positions shown, 20 images · non-contrast
Comparison: None.

CLINICAL DATA: Left arm numbness.

EXAM:
CT HEAD WITHOUT CONTRAST
TECHNIQUE: Contiguous axial images were obtained from the base of the skull
through the vertex without intravenous contrast.

[Series 2: head w/o · axial · non-contrast · 0.48mm/px · z∈[-135,-15]mm · 13 of 29 slices shown, 17 images]
[im 3/29  brain]
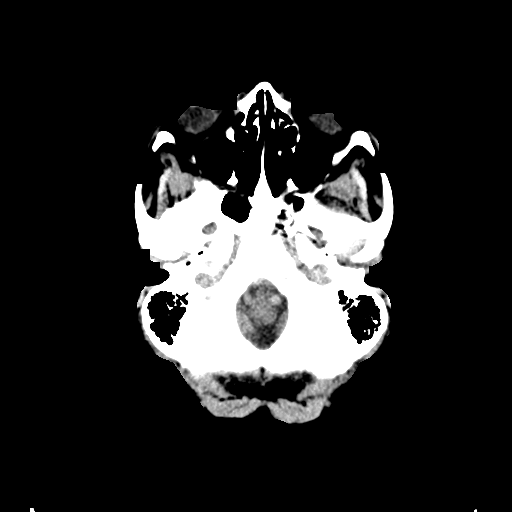
[im 3/29  bone]
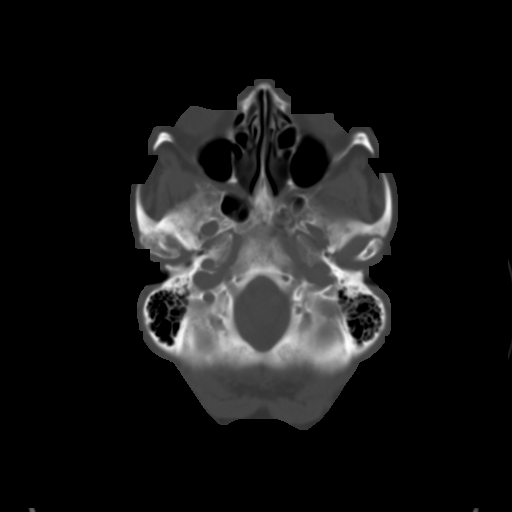
[im 5/29  brain]
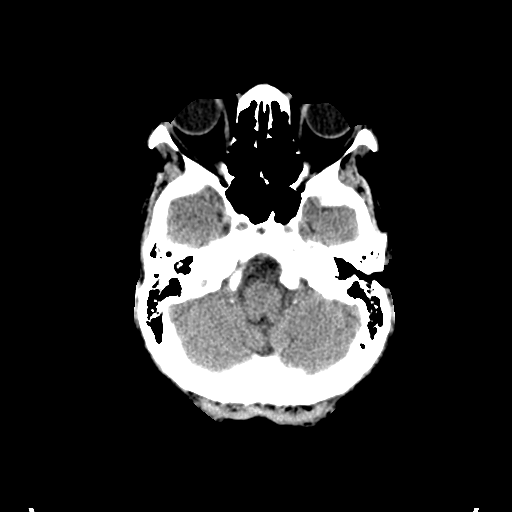
[im 7/29  brain]
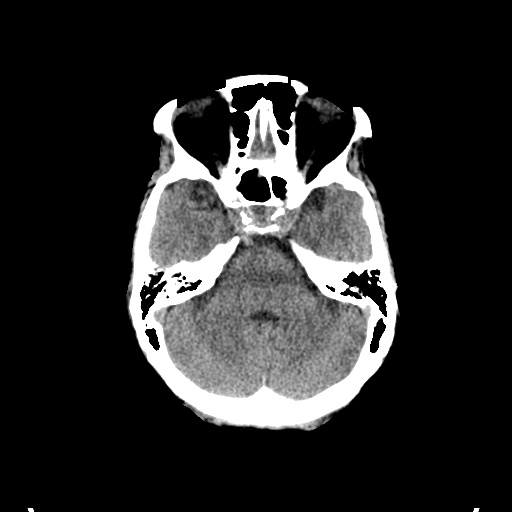
[im 9/29  brain]
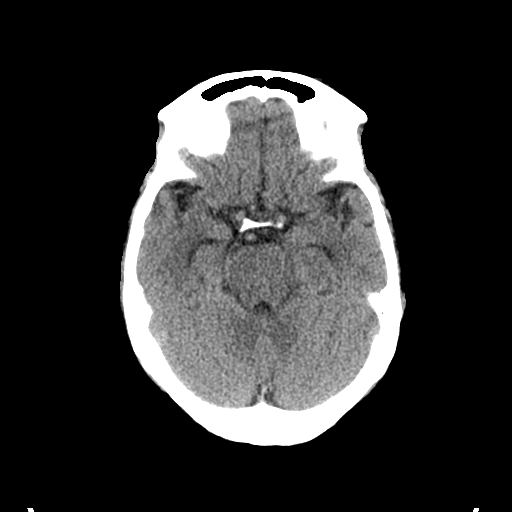
[im 11/29  brain]
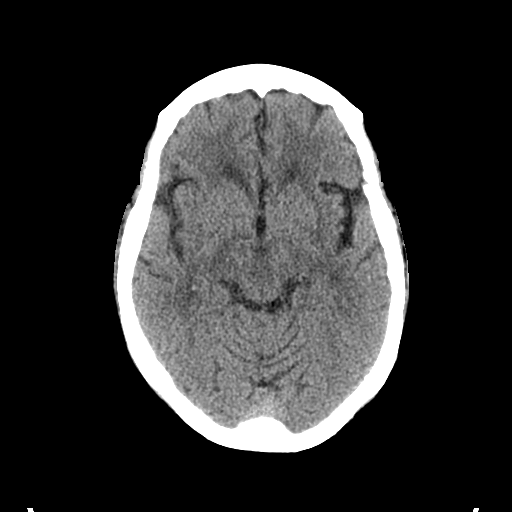
[im 11/29  bone]
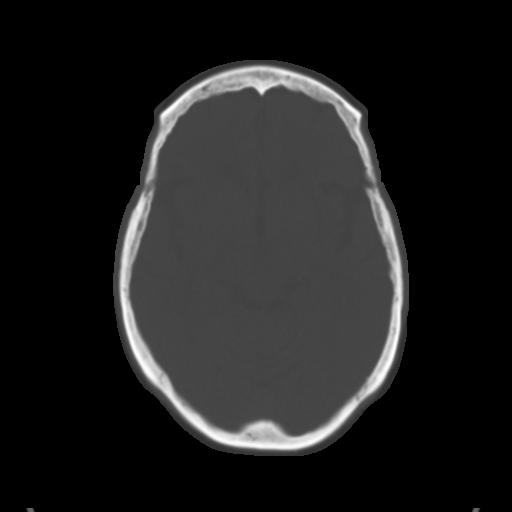
[im 13/29  brain]
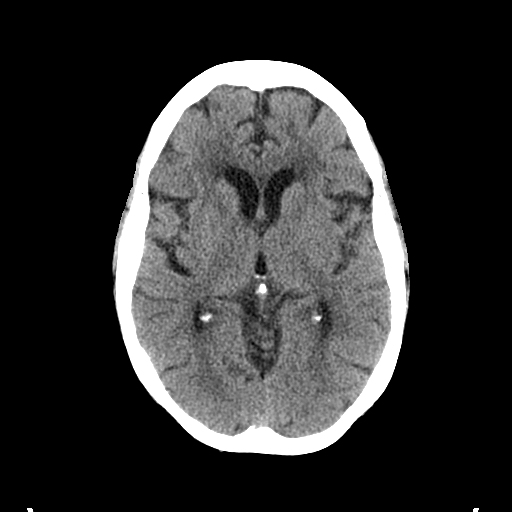
[im 15/29  brain]
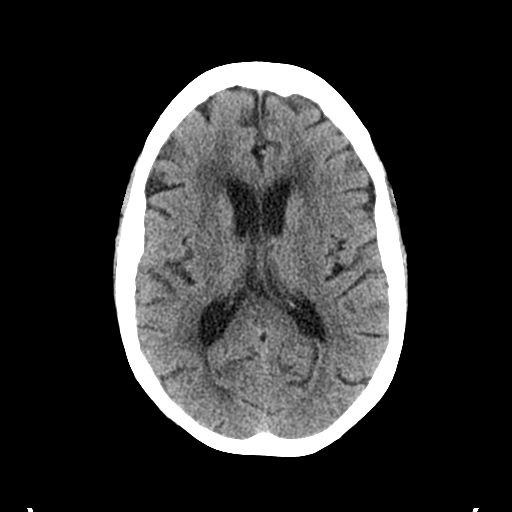
[im 17/29  brain]
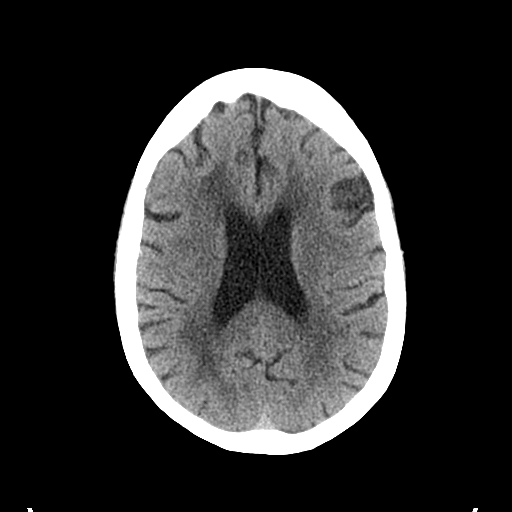
[im 19/29  brain]
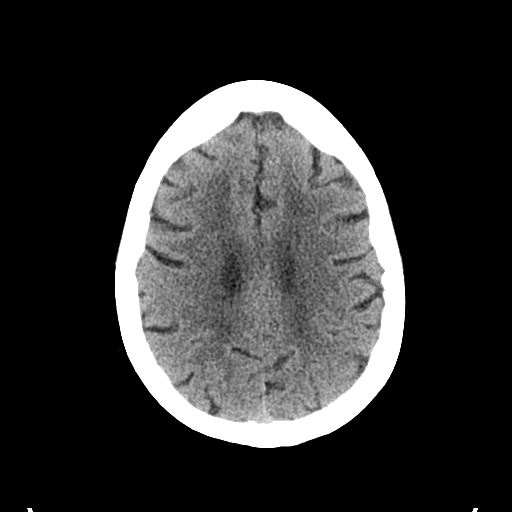
[im 19/29  bone]
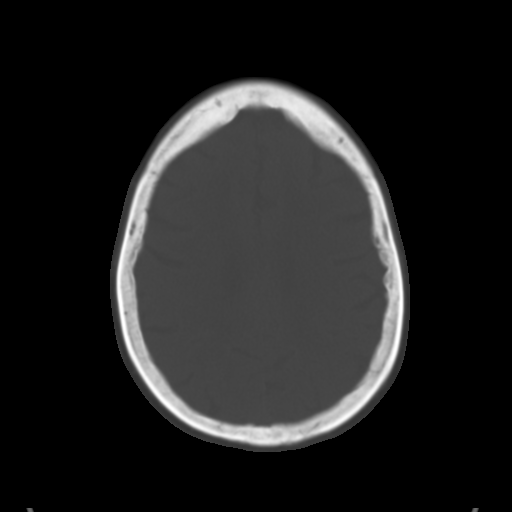
[im 21/29  brain]
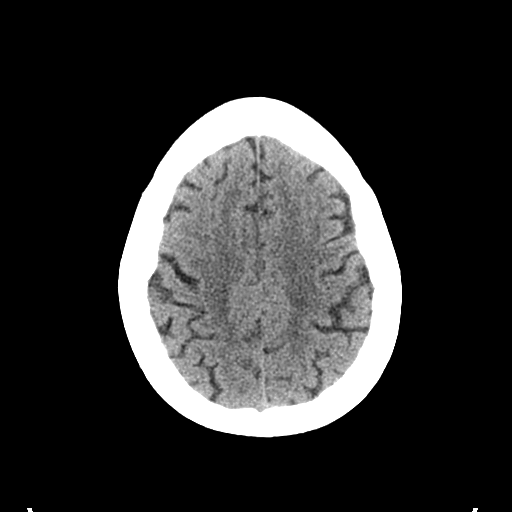
[im 23/29  brain]
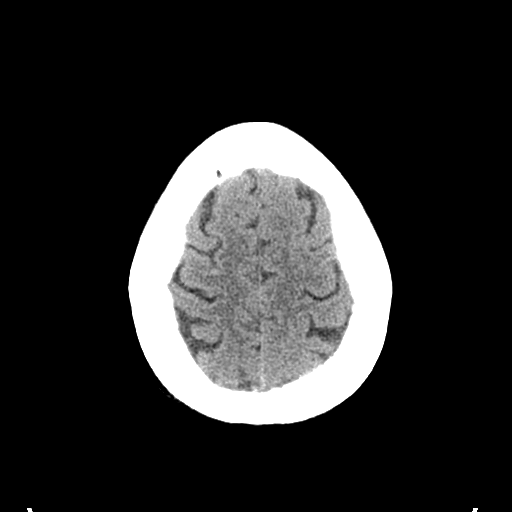
[im 25/29  brain]
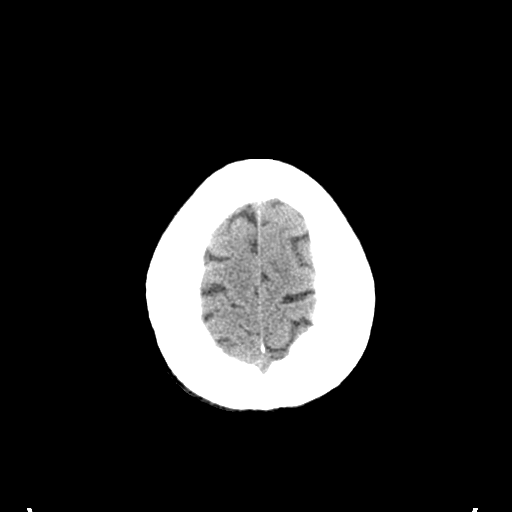
[im 27/29  brain]
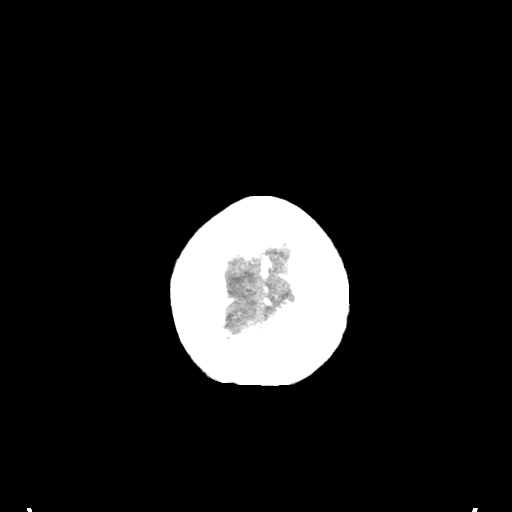
[im 27/29  bone]
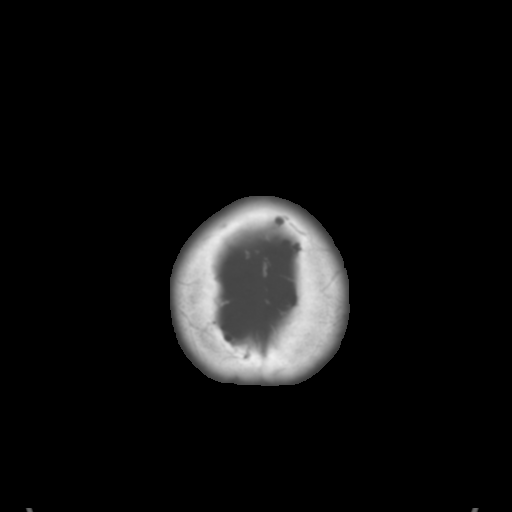

[Series 3: bone windows · axial · 0.48mm/px · z∈[-135,-95]mm · 3 of 29 slices shown]
[im 3/29  bone]
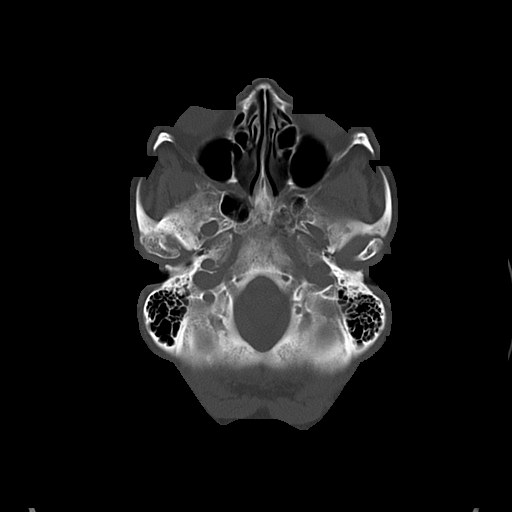
[im 7/29  bone]
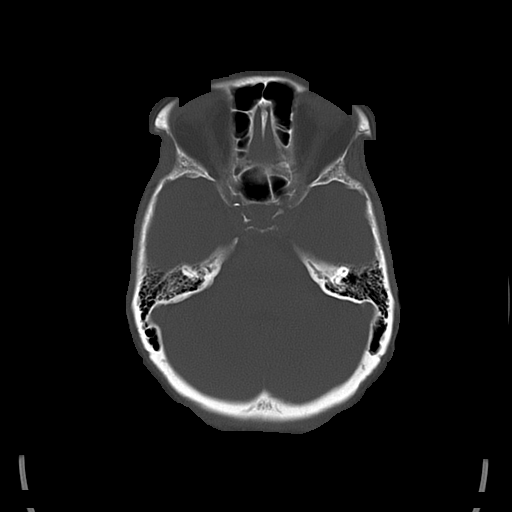
[im 11/29  bone]
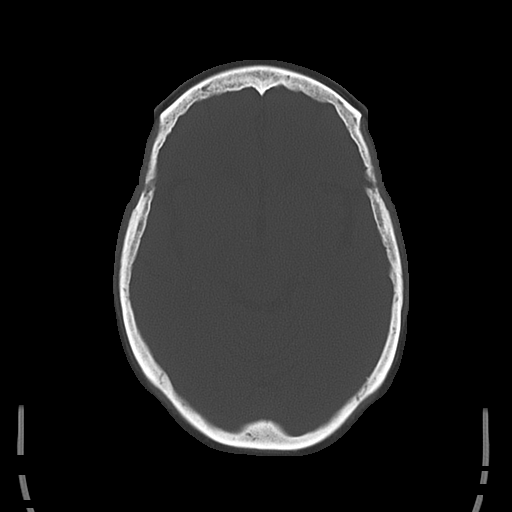

[16 of 30 positions shown; findings below may reference images not displayed]

FINDINGS: Mild low-density throughout the deep white matter compatible with
chronic microvascular disease. No acute intracranial abnormality.
Specifically, no hemorrhage, hydrocephalus, mass lesion, acute
infarction, or significant intracranial injury. No acute calvarial
abnormality. Visualized paranasal sinuses and mastoids clear.
Orbital soft tissues unremarkable.
IMPRESSION: Mild chronic small vessel disease.

No acute intracranial abnormality.

## 2015-06-01 DIAGNOSIS — N08 Glomerular disorders in diseases classified elsewhere: Secondary | ICD-10-CM | POA: Diagnosis not present

## 2015-06-01 DIAGNOSIS — R413 Other amnesia: Secondary | ICD-10-CM | POA: Diagnosis not present

## 2015-06-01 DIAGNOSIS — E1122 Type 2 diabetes mellitus with diabetic chronic kidney disease: Secondary | ICD-10-CM | POA: Diagnosis not present

## 2015-06-01 DIAGNOSIS — M353 Polymyalgia rheumatica: Secondary | ICD-10-CM | POA: Diagnosis not present

## 2015-06-01 DIAGNOSIS — N182 Chronic kidney disease, stage 2 (mild): Secondary | ICD-10-CM | POA: Diagnosis not present

## 2015-06-01 DIAGNOSIS — I129 Hypertensive chronic kidney disease with stage 1 through stage 4 chronic kidney disease, or unspecified chronic kidney disease: Secondary | ICD-10-CM | POA: Diagnosis not present

## 2015-06-01 DIAGNOSIS — G4762 Sleep related leg cramps: Secondary | ICD-10-CM | POA: Diagnosis not present

## 2015-09-28 DIAGNOSIS — N182 Chronic kidney disease, stage 2 (mild): Secondary | ICD-10-CM | POA: Diagnosis not present

## 2015-09-28 DIAGNOSIS — N08 Glomerular disorders in diseases classified elsewhere: Secondary | ICD-10-CM | POA: Diagnosis not present

## 2015-09-28 DIAGNOSIS — I129 Hypertensive chronic kidney disease with stage 1 through stage 4 chronic kidney disease, or unspecified chronic kidney disease: Secondary | ICD-10-CM | POA: Diagnosis not present

## 2015-09-28 DIAGNOSIS — E1122 Type 2 diabetes mellitus with diabetic chronic kidney disease: Secondary | ICD-10-CM | POA: Diagnosis not present

## 2015-10-16 DIAGNOSIS — H40033 Anatomical narrow angle, bilateral: Secondary | ICD-10-CM | POA: Diagnosis not present

## 2015-10-16 DIAGNOSIS — H02834 Dermatochalasis of left upper eyelid: Secondary | ICD-10-CM | POA: Diagnosis not present

## 2015-10-16 DIAGNOSIS — H02831 Dermatochalasis of right upper eyelid: Secondary | ICD-10-CM | POA: Diagnosis not present

## 2015-10-16 DIAGNOSIS — H25813 Combined forms of age-related cataract, bilateral: Secondary | ICD-10-CM | POA: Diagnosis not present

## 2015-11-09 DIAGNOSIS — H268 Other specified cataract: Secondary | ICD-10-CM | POA: Diagnosis not present

## 2015-11-09 DIAGNOSIS — H2511 Age-related nuclear cataract, right eye: Secondary | ICD-10-CM | POA: Diagnosis not present

## 2015-11-17 DIAGNOSIS — H2512 Age-related nuclear cataract, left eye: Secondary | ICD-10-CM | POA: Diagnosis not present

## 2015-11-23 DIAGNOSIS — H2512 Age-related nuclear cataract, left eye: Secondary | ICD-10-CM | POA: Diagnosis not present

## 2015-11-23 DIAGNOSIS — H25812 Combined forms of age-related cataract, left eye: Secondary | ICD-10-CM | POA: Diagnosis not present

## 2016-01-11 DIAGNOSIS — Z1231 Encounter for screening mammogram for malignant neoplasm of breast: Secondary | ICD-10-CM | POA: Diagnosis not present

## 2016-03-22 DIAGNOSIS — E1122 Type 2 diabetes mellitus with diabetic chronic kidney disease: Secondary | ICD-10-CM | POA: Diagnosis not present

## 2016-03-22 DIAGNOSIS — N182 Chronic kidney disease, stage 2 (mild): Secondary | ICD-10-CM | POA: Diagnosis not present

## 2016-03-22 DIAGNOSIS — I129 Hypertensive chronic kidney disease with stage 1 through stage 4 chronic kidney disease, or unspecified chronic kidney disease: Secondary | ICD-10-CM | POA: Diagnosis not present

## 2016-03-22 DIAGNOSIS — Z0000 Encounter for general adult medical examination without abnormal findings: Secondary | ICD-10-CM | POA: Diagnosis not present

## 2016-03-22 DIAGNOSIS — Z1389 Encounter for screening for other disorder: Secondary | ICD-10-CM | POA: Diagnosis not present

## 2016-03-22 DIAGNOSIS — E559 Vitamin D deficiency, unspecified: Secondary | ICD-10-CM | POA: Diagnosis not present

## 2016-03-22 DIAGNOSIS — N08 Glomerular disorders in diseases classified elsewhere: Secondary | ICD-10-CM | POA: Diagnosis not present

## 2016-04-21 DIAGNOSIS — M899 Disorder of bone, unspecified: Secondary | ICD-10-CM | POA: Diagnosis not present

## 2016-05-25 DIAGNOSIS — Z79899 Other long term (current) drug therapy: Secondary | ICD-10-CM | POA: Diagnosis not present

## 2016-05-25 DIAGNOSIS — E785 Hyperlipidemia, unspecified: Secondary | ICD-10-CM | POA: Diagnosis not present

## 2016-05-25 DIAGNOSIS — I129 Hypertensive chronic kidney disease with stage 1 through stage 4 chronic kidney disease, or unspecified chronic kidney disease: Secondary | ICD-10-CM | POA: Diagnosis not present

## 2016-05-25 DIAGNOSIS — N182 Chronic kidney disease, stage 2 (mild): Secondary | ICD-10-CM | POA: Diagnosis not present

## 2016-08-12 DIAGNOSIS — N08 Glomerular disorders in diseases classified elsewhere: Secondary | ICD-10-CM | POA: Diagnosis not present

## 2016-08-12 DIAGNOSIS — N182 Chronic kidney disease, stage 2 (mild): Secondary | ICD-10-CM | POA: Diagnosis not present

## 2016-08-12 DIAGNOSIS — E1122 Type 2 diabetes mellitus with diabetic chronic kidney disease: Secondary | ICD-10-CM | POA: Diagnosis not present

## 2016-08-12 DIAGNOSIS — I129 Hypertensive chronic kidney disease with stage 1 through stage 4 chronic kidney disease, or unspecified chronic kidney disease: Secondary | ICD-10-CM | POA: Diagnosis not present

## 2016-08-15 DIAGNOSIS — E1122 Type 2 diabetes mellitus with diabetic chronic kidney disease: Secondary | ICD-10-CM | POA: Diagnosis not present

## 2016-12-05 DIAGNOSIS — N182 Chronic kidney disease, stage 2 (mild): Secondary | ICD-10-CM | POA: Diagnosis not present

## 2016-12-05 DIAGNOSIS — N08 Glomerular disorders in diseases classified elsewhere: Secondary | ICD-10-CM | POA: Diagnosis not present

## 2016-12-05 DIAGNOSIS — E1122 Type 2 diabetes mellitus with diabetic chronic kidney disease: Secondary | ICD-10-CM | POA: Diagnosis not present

## 2016-12-05 DIAGNOSIS — I129 Hypertensive chronic kidney disease with stage 1 through stage 4 chronic kidney disease, or unspecified chronic kidney disease: Secondary | ICD-10-CM | POA: Diagnosis not present

## 2016-12-28 DIAGNOSIS — H43812 Vitreous degeneration, left eye: Secondary | ICD-10-CM | POA: Diagnosis not present

## 2016-12-28 DIAGNOSIS — E119 Type 2 diabetes mellitus without complications: Secondary | ICD-10-CM | POA: Diagnosis not present

## 2016-12-28 DIAGNOSIS — H04123 Dry eye syndrome of bilateral lacrimal glands: Secondary | ICD-10-CM | POA: Diagnosis not present

## 2016-12-28 DIAGNOSIS — Z961 Presence of intraocular lens: Secondary | ICD-10-CM | POA: Diagnosis not present

## 2017-01-11 DIAGNOSIS — Z1231 Encounter for screening mammogram for malignant neoplasm of breast: Secondary | ICD-10-CM | POA: Diagnosis not present

## 2017-01-24 DIAGNOSIS — Z23 Encounter for immunization: Secondary | ICD-10-CM | POA: Diagnosis not present

## 2017-04-10 DIAGNOSIS — Z0000 Encounter for general adult medical examination without abnormal findings: Secondary | ICD-10-CM | POA: Diagnosis not present

## 2017-04-10 DIAGNOSIS — N182 Chronic kidney disease, stage 2 (mild): Secondary | ICD-10-CM | POA: Diagnosis not present

## 2017-04-10 DIAGNOSIS — E1122 Type 2 diabetes mellitus with diabetic chronic kidney disease: Secondary | ICD-10-CM | POA: Diagnosis not present

## 2017-04-10 DIAGNOSIS — E559 Vitamin D deficiency, unspecified: Secondary | ICD-10-CM | POA: Diagnosis not present

## 2017-04-10 DIAGNOSIS — I129 Hypertensive chronic kidney disease with stage 1 through stage 4 chronic kidney disease, or unspecified chronic kidney disease: Secondary | ICD-10-CM | POA: Diagnosis not present

## 2017-04-14 DIAGNOSIS — E1122 Type 2 diabetes mellitus with diabetic chronic kidney disease: Secondary | ICD-10-CM | POA: Diagnosis not present

## 2017-04-14 DIAGNOSIS — N08 Glomerular disorders in diseases classified elsewhere: Secondary | ICD-10-CM | POA: Diagnosis not present

## 2017-04-14 DIAGNOSIS — N183 Chronic kidney disease, stage 3 (moderate): Secondary | ICD-10-CM | POA: Diagnosis not present

## 2017-08-03 DIAGNOSIS — N183 Chronic kidney disease, stage 3 (moderate): Secondary | ICD-10-CM

## 2017-08-03 DIAGNOSIS — I129 Hypertensive chronic kidney disease with stage 1 through stage 4 chronic kidney disease, or unspecified chronic kidney disease: Secondary | ICD-10-CM | POA: Insufficient documentation

## 2017-08-03 DIAGNOSIS — N289 Disorder of kidney and ureter, unspecified: Secondary | ICD-10-CM | POA: Insufficient documentation

## 2017-08-03 LAB — BASIC METABOLIC PANEL
BUN: 12 (ref 4–21)
Creatinine: 1.1 (ref 0.5–1.1)
Glucose: 122
Potassium: 4 (ref 3.4–5.3)
Sodium: 141 (ref 137–147)

## 2017-08-03 LAB — HEMOGLOBIN A1C: Hemoglobin A1C: 6.8

## 2017-08-03 LAB — LIPID PANEL
Cholesterol: 209 — AB (ref 0–200)
HDL: 41 (ref 35–70)
LDL Cholesterol: 141
LDl/HDL Ratio: 3.4
Triglycerides: 137 (ref 40–160)

## 2017-08-03 LAB — HEPATIC FUNCTION PANEL
ALT: 17 (ref 7–35)
AST: 31 (ref 13–35)
Alkaline Phosphatase: 56 (ref 25–125)
Bilirubin, Total: 0.6

## 2017-08-14 DIAGNOSIS — E1122 Type 2 diabetes mellitus with diabetic chronic kidney disease: Secondary | ICD-10-CM | POA: Diagnosis not present

## 2017-08-14 DIAGNOSIS — N08 Glomerular disorders in diseases classified elsewhere: Secondary | ICD-10-CM | POA: Diagnosis not present

## 2017-08-14 DIAGNOSIS — N183 Chronic kidney disease, stage 3 (moderate): Secondary | ICD-10-CM | POA: Diagnosis not present

## 2017-08-14 DIAGNOSIS — I129 Hypertensive chronic kidney disease with stage 1 through stage 4 chronic kidney disease, or unspecified chronic kidney disease: Secondary | ICD-10-CM | POA: Diagnosis not present

## 2017-08-14 DIAGNOSIS — M353 Polymyalgia rheumatica: Secondary | ICD-10-CM | POA: Diagnosis not present

## 2017-12-14 DIAGNOSIS — I129 Hypertensive chronic kidney disease with stage 1 through stage 4 chronic kidney disease, or unspecified chronic kidney disease: Secondary | ICD-10-CM | POA: Diagnosis not present

## 2017-12-14 DIAGNOSIS — E1122 Type 2 diabetes mellitus with diabetic chronic kidney disease: Secondary | ICD-10-CM | POA: Diagnosis not present

## 2017-12-14 DIAGNOSIS — N183 Chronic kidney disease, stage 3 (moderate): Secondary | ICD-10-CM | POA: Diagnosis not present

## 2017-12-14 DIAGNOSIS — N08 Glomerular disorders in diseases classified elsewhere: Secondary | ICD-10-CM | POA: Diagnosis not present

## 2017-12-28 DIAGNOSIS — H04123 Dry eye syndrome of bilateral lacrimal glands: Secondary | ICD-10-CM | POA: Diagnosis not present

## 2017-12-28 DIAGNOSIS — H11822 Conjunctivochalasis, left eye: Secondary | ICD-10-CM | POA: Diagnosis not present

## 2017-12-28 DIAGNOSIS — R7303 Prediabetes: Secondary | ICD-10-CM | POA: Diagnosis not present

## 2017-12-28 LAB — HM DIABETES EYE EXAM

## 2018-02-03 ENCOUNTER — Encounter: Payer: Self-pay | Admitting: Internal Medicine

## 2018-02-03 DIAGNOSIS — N183 Chronic kidney disease, stage 3 (moderate): Secondary | ICD-10-CM

## 2018-02-03 DIAGNOSIS — I129 Hypertensive chronic kidney disease with stage 1 through stage 4 chronic kidney disease, or unspecified chronic kidney disease: Secondary | ICD-10-CM

## 2018-02-03 DIAGNOSIS — N289 Disorder of kidney and ureter, unspecified: Secondary | ICD-10-CM

## 2018-02-05 ENCOUNTER — Ambulatory Visit: Payer: Medicare Other | Admitting: Internal Medicine

## 2018-02-05 ENCOUNTER — Encounter: Payer: Self-pay | Admitting: Internal Medicine

## 2018-02-05 VITALS — BP 150/90 | HR 70 | Temp 97.8°F | Ht 60.25 in | Wt 143.4 lb

## 2018-02-05 DIAGNOSIS — N183 Chronic kidney disease, stage 3 (moderate): Secondary | ICD-10-CM

## 2018-02-05 DIAGNOSIS — N289 Disorder of kidney and ureter, unspecified: Secondary | ICD-10-CM | POA: Diagnosis not present

## 2018-02-05 DIAGNOSIS — J301 Allergic rhinitis due to pollen: Secondary | ICD-10-CM | POA: Diagnosis not present

## 2018-02-05 DIAGNOSIS — I129 Hypertensive chronic kidney disease with stage 1 through stage 4 chronic kidney disease, or unspecified chronic kidney disease: Secondary | ICD-10-CM | POA: Diagnosis not present

## 2018-02-05 DIAGNOSIS — Z23 Encounter for immunization: Secondary | ICD-10-CM

## 2018-02-05 DIAGNOSIS — E7800 Pure hypercholesterolemia, unspecified: Secondary | ICD-10-CM

## 2018-02-05 DIAGNOSIS — Z79899 Other long term (current) drug therapy: Secondary | ICD-10-CM | POA: Diagnosis not present

## 2018-02-05 NOTE — Patient Instructions (Signed)

## 2018-02-05 NOTE — Progress Notes (Addendum)
Subjective:     Patient ID: Rebekah Avila , female    DOB: [birth year] , 84 y.o.   MRN: [id number]   She is here today for chol check. The dose of pravastatin was increased to 65m at her last visit. She has been intolerant of other statins in the past.   Hyperlipidemia  This is a chronic problem. The problem is uncontrolled. Exacerbating diseases include diabetes. Pertinent negatives include no chest pain, leg pain, myalgias or shortness of breath. Current antihyperlipidemic treatment includes statins.     Past Medical History:  Diagnosis Date  . Hypertension       Current Outpatient Medications:  .  ACCU-CHEK FASTCLIX LANCETS MISC, by Does not apply route., Disp: , Rfl:  .  acetaminophen (TYLENOL) 500 MG tablet, Take 1,000 mg by mouth every 6 (six) hours as needed for moderate pain., Disp: , Rfl:  .  aspirin EC 81 MG tablet, Take 81 mg by mouth daily., Disp: , Rfl:  .  Blood Glucose Monitoring Suppl (ACCU-CHEK NANO SMARTVIEW) w/Device KIT, 1 strip by Does not apply route., Disp: , Rfl:  .  Cholecalciferol (VITAMIN D) 2000 units CAPS, Take by mouth., Disp: , Rfl:  .  Fish Oil OIL, Take 1 capsule by mouth daily., Disp: , Rfl:  .  fluticasone (FLONASE) 50 MCG/ACT nasal spray, Place 1 spray into both nostrils daily as needed for allergies or rhinitis., Disp: , Rfl:  .  hydrochlorothiazide (HYDRODIURIL) 25 MG tablet, Take 25 mg by mouth daily., Disp: , Rfl:  .  lisinopril (PRINIVIL,ZESTRIL) 2.5 MG tablet, Take 1 tablet (2.5 mg total) by mouth daily., Disp: 30 tablet, Rfl: 0 .  metoprolol (LOPRESSOR) 50 MG tablet, Take 50 mg by mouth 2 (two) times daily., Disp: , Rfl:  .  olmesartan (BENICAR) 40 MG tablet, Take 40 mg by mouth daily., Disp: , Rfl:  .  rosuvastatin (CRESTOR) 20 MG tablet, Take 20 mg by mouth every Wednesday., Disp: , Rfl:  .  hydrochlorothiazide (HYDRODIURIL) 25 MG tablet, TAKE ONE TABLET BY MOUTH ONE TIME DAILY , Disp: 90 tablet, Rfl: 0 .  pravastatin (PRAVACHOL) 40 MG  tablet, Take 1 tablet (40 mg total) by mouth daily., Disp: 30 tablet, Rfl: 1   Allergies  Allergen Reactions  . Morphine And Related Rash     Review of Systems  Constitutional: Negative.   HENT: Positive for postnasal drip and rhinorrhea.   Eyes: Negative.   Respiratory: Negative.  Negative for shortness of breath.   Cardiovascular: Negative.  Negative for chest pain.  Gastrointestinal: Negative.   Musculoskeletal: Negative for myalgias.  Psychiatric/Behavioral: Negative.      Today's Vitals   02/05/18 1129  BP: (!) 150/90  Pulse: 70  Temp: 97.8 F (36.6 C)  TempSrc: Oral  SpO2: 98%  Weight: 143 lb 6.4 oz (65 kg)  Height: 5' 0.25" (1.53 m)   Body mass index is 27.77 kg/m.   Objective:  Physical Exam  Constitutional: She appears well-developed and well-nourished.  HENT:  Head: Normocephalic and atraumatic.  Eyes: EOM are normal.  Neck: Normal range of motion.  Cardiovascular: Normal rate, regular rhythm and normal heart sounds.  Pulmonary/Chest: Effort normal and breath sounds normal.  Skin: Skin is warm and dry.  Psychiatric: She has a normal mood and affect.  Nursing note and vitals reviewed.       Assessment And Plan:   Pure hypercholesterolemia - I WILL CHECK LIPID PANEL AND LFTS TODAY. SHE WILL RTO IN DEC 2019  AS PREVIOUSLY SCHEDULED FOR HER NEXT DM CHECK.  - Plan: Lipid Profile  Seasonal allergic rhinitis due to pollen - SHE WAS GIVEN SAMPLES OF ZYRTEC TO TAKE NIGHTLY. SHE WILL CALL ME IN A WEEK TO LET ME KNOW HOW SHE IS DOING   Renal insufficiency - HER KIDNEY FUNCTION HAS DECREASED AT LAST VISIT, I WILL RECHECK THIS TODAY.  - Plan: BMP8+EGFR  Need for influenza vaccination - SHE WAS GIVEN HIGH DOSE FLU VACCINE TODAY.  - Plan: Flu vaccine HIGH DOSE PF (Fluzone High dose)  Polypharmacy - Plan: AST, ALT  Hypertensive nephropathy - Uncontrolled. She will continue with current meds for now. She has yet to take her meds today. Importance of med compliance  was discussed with the patient.     Maximino Greenland, MD

## 2018-02-06 DIAGNOSIS — Z1231 Encounter for screening mammogram for malignant neoplasm of breast: Secondary | ICD-10-CM | POA: Diagnosis not present

## 2018-02-06 LAB — BMP8+EGFR
BUN/Creatinine Ratio: 11 — ABNORMAL LOW (ref 12–28)
BUN: 12 mg/dL (ref 8–27)
CO2: 25 mmol/L (ref 20–29)
Calcium: 10 mg/dL (ref 8.7–10.3)
Chloride: 102 mmol/L (ref 96–106)
Creatinine, Ser: 1.12 mg/dL — ABNORMAL HIGH (ref 0.57–1.00)
GFR calc Af Amer: 52 mL/min/{1.73_m2} — ABNORMAL LOW (ref >59)
GFR calc non Af Amer: 46 mL/min/{1.73_m2} — ABNORMAL LOW (ref >59)
Glucose: 126 mg/dL — ABNORMAL HIGH (ref 65–99)
Potassium: 3.8 mmol/L (ref 3.5–5.2)
Sodium: 144 mmol/L (ref 134–144)

## 2018-02-06 LAB — ALT: ALT: 19 IU/L (ref 0–32)

## 2018-02-06 LAB — LIPID PANEL
Chol/HDL Ratio: 4.1 ratio (ref 0.0–4.4)
Cholesterol, Total: 175 mg/dL (ref 100–199)
HDL: 43 mg/dL (ref >39)
LDL Calculated: 110 mg/dL — ABNORMAL HIGH (ref 0–99)
Triglycerides: 112 mg/dL (ref 0–149)
VLDL Cholesterol Cal: 22 mg/dL (ref 5–40)

## 2018-02-06 LAB — AST: AST: 32 IU/L (ref 0–40)

## 2018-02-06 LAB — HM MAMMOGRAPHY: HM Mammogram: NORMAL (ref 0–4)

## 2018-02-07 NOTE — Progress Notes (Signed)
Your LDL, bad chol is 110. As a diabetic, this should be less than 70. You have been taking chol medication correct? Your liver function is normal.  Your kidney function is stable. Continue with current meds. Stay well hydrated.

## 2018-02-14 ENCOUNTER — Other Ambulatory Visit: Payer: Self-pay

## 2018-02-14 MED ORDER — PRAVASTATIN SODIUM 40 MG PO TABS: 40.0000 mg | ORAL_TABLET | Freq: Every day | ORAL | 1 refills | Status: DC

## 2018-02-16 ENCOUNTER — Other Ambulatory Visit: Payer: Self-pay | Admitting: Internal Medicine

## 2018-02-26 ENCOUNTER — Encounter: Payer: Self-pay | Admitting: Internal Medicine

## 2018-02-27 ENCOUNTER — Other Ambulatory Visit: Payer: Self-pay | Admitting: Internal Medicine

## 2018-03-13 ENCOUNTER — Other Ambulatory Visit: Payer: Self-pay | Admitting: Internal Medicine

## 2018-04-12 ENCOUNTER — Ambulatory Visit: Payer: Medicare Other | Admitting: Internal Medicine

## 2018-04-12 ENCOUNTER — Encounter: Payer: Self-pay | Admitting: Internal Medicine

## 2018-04-12 VITALS — BP 138/76 | HR 63 | Temp 97.6°F | Ht 60.5 in | Wt 142.4 lb

## 2018-04-12 DIAGNOSIS — E2839 Other primary ovarian failure: Secondary | ICD-10-CM | POA: Diagnosis not present

## 2018-04-12 DIAGNOSIS — N183 Chronic kidney disease, stage 3 (moderate): Secondary | ICD-10-CM | POA: Diagnosis not present

## 2018-04-12 DIAGNOSIS — Z23 Encounter for immunization: Secondary | ICD-10-CM

## 2018-04-12 DIAGNOSIS — I129 Hypertensive chronic kidney disease with stage 1 through stage 4 chronic kidney disease, or unspecified chronic kidney disease: Secondary | ICD-10-CM

## 2018-04-12 DIAGNOSIS — E1122 Type 2 diabetes mellitus with diabetic chronic kidney disease: Secondary | ICD-10-CM | POA: Diagnosis not present

## 2018-04-12 DIAGNOSIS — Z0000 Encounter for general adult medical examination without abnormal findings: Secondary | ICD-10-CM | POA: Diagnosis not present

## 2018-04-12 LAB — POCT URINALYSIS DIPSTICK
Bilirubin, UA: NEGATIVE
Glucose, UA: NEGATIVE
Ketones, UA: NEGATIVE
Nitrite, UA: NEGATIVE
Protein, UA: NEGATIVE
Spec Grav, UA: 1.015 (ref 1.010–1.025)
Urobilinogen, UA: 0.2 E.U./dL
pH, UA: 7 (ref 5.0–8.0)

## 2018-04-12 LAB — POCT UA - MICROALBUMIN
Albumin/Creatinine Ratio, Urine, POC: 30
Creatinine, POC: 200 mg/dL
Microalbumin Ur, POC: 30 mg/L

## 2018-04-12 MED ORDER — TETANUS-DIPHTH-ACELL PERTUSSIS 5-2.5-18.5 LF-MCG/0.5 IM SUSP: 0.5000 mL | Freq: Once | INTRAMUSCULAR | 0 refills | Status: AC

## 2018-04-12 NOTE — Progress Notes (Signed)
Subjective:   Rebekah Avila is a 84 y.o. female who presents for Medicare Annual (Subsequent) preventive examination.  She is here today for her annual wellness visit. She has no specific concerns or complaints at this time.   Diabetes  She presents for her follow-up diabetic visit. She has type 2 diabetes mellitus. Her disease course has been stable. There are no hypoglycemic associated symptoms. Pertinent negatives for diabetes include no blurred vision and no chest pain. There are no hypoglycemic complications. Risk factors for coronary artery disease include diabetes mellitus, hypertension, post-menopausal, sedentary lifestyle and dyslipidemia.  Hypertension  This is a chronic problem. The current episode started more than 1 year ago. The problem has been gradually improving since onset. The problem is controlled. Pertinent negatives include no blurred vision, chest pain, palpitations or shortness of breath.  She reports compliance with meds.   Review of Systems:   Review of Systems  Constitutional: Negative.   HENT: Negative.   Eyes: Negative.  Negative for blurred vision.  Respiratory: Negative.  Negative for shortness of breath.   Cardiovascular: Negative.  Negative for chest pain and palpitations.  Gastrointestinal: Negative.   Genitourinary: Negative.   Musculoskeletal: Negative.   Skin: Negative.   Neurological: Negative.   Endo/Heme/Allergies: Negative.   Psychiatric/Behavioral: Negative.     Cardiac Risk Factors include: hypertension, diabetes, high cholesterol     Objective:     Vitals: BP 138/76 (BP Location: Left Arm, Patient Position: Sitting, Cuff Size: Normal)   Pulse 63   Temp 97.6 F (36.4 C) (Oral)   Ht 5' 0.5" (1.537 m)   Wt 142 lb 6.4 oz (64.6 kg)   BMI 27.35 kg/m   Body mass index is 27.35 kg/m.   Physical Exam  Constitutional: She is oriented to person, place, and time and well-developed, well-nourished, and in no distress.  HENT:  Head:  Normocephalic and atraumatic.  Right Ear: External ear normal.  Left Ear: External ear normal.  Nose: Nose normal.  Mouth/Throat: Oropharynx is clear and moist.  Eyes: Pupils are equal, round, and reactive to light. Conjunctivae and EOM are normal.  Neck: Normal range of motion. Neck supple.  Cardiovascular: Normal rate, regular rhythm, normal heart sounds and intact distal pulses.  Pulmonary/Chest: Effort normal and breath sounds normal. Right breast exhibits no inverted nipple, no mass, no nipple discharge, no skin change and no tenderness. Left breast exhibits no inverted nipple, no mass, no nipple discharge, no skin change and no tenderness.  Abdominal: Soft. Bowel sounds are normal.  Genitourinary:    Genitourinary Comments: deferred   Musculoskeletal: Normal range of motion.     Comments: Diabetic foot exam was performed.  Pulses assessed as well as sensory exam. Five areas assessed/she had sensation in all five areas.   Neurological: She is alert and oriented to person, place, and time. She has normal reflexes. Gait normal. GCS score is 15.  Skin: Skin is warm and dry.  Psychiatric: Affect normal.  Nursing note and vitals reviewed.   Advanced Directives 06/30/2013  Does Patient Have a Medical Advance Directive? Patient has advance directive, copy not in chart  Type of Advance Directive [place];Living will  Does patient want to make changes to medical advance directive? No change requested  Copy of [place] in Chart? Copy requested from family  Pre-existing out of facility DNR order (yellow form or pink MOST form) No    Tobacco Social History   Tobacco Use  Smoking  Status Never Smoker  Smokeless Tobacco Never Used     Counseling given: Not Answered   Clinical Intake:  Pre-visit preparation completed: No  Pain : No/denies pain Pain Score: 0-No pain     BMI - recorded: 27 Nutritional Status: BMI 25 -29 Overweight Diabetes:  Yes CBG done?: No Did pt. bring in CBG monitor from home?: No  How often do you need to have someone help you when you read instructions, pamphlets, or other written materials from your doctor or pharmacy?: 1 - Never What is the last grade level you completed in school?: Bachelor's in Nursing  Interpreter Needed?: No  Information entered by :: Atsushi Yom N. Baird Cancer, MD  Past Medical History:  Diagnosis Date  . Hypertension    Past Surgical History:  Procedure Laterality Date  . ECTOPIC PREGNANCY SURGERY    . HERNIA REPAIR     Family History  Problem Relation Age of Onset  . Healthy Mother   . Heart attack Father    Social History   Socioeconomic History  . Marital status: Single    Spouse name: Not on file  . Number of children: Not on file  . Years of education: Not on file  . Highest education level: Not on file  Occupational History  . Not on file  Social Needs  . Financial resource strain: Not on file  . Food insecurity:    Worry: Not on file    Inability: Not on file  . Transportation needs:    Medical: Not on file    Non-medical: Not on file  Tobacco Use  . Smoking status: Never Smoker  . Smokeless tobacco: Never Used  Substance and Sexual Activity  . Alcohol use: No  . Drug use: No  . Sexual activity: Yes  Lifestyle  . Physical activity:    Days per week: Not on file    Minutes per session: Not on file  . Stress: Not on file  Relationships  . Social connections:    Talks on phone: Not on file    Gets together: Not on file    Attends religious service: Not on file    Active member of club or organization: Not on file    Attends meetings of clubs or organizations: Not on file    Relationship status: Not on file  Other Topics Concern  . Not on file  Social History Narrative  . Not on file    Outpatient Encounter Medications as of 04/12/2018  Medication Sig  . ACCU-CHEK FASTCLIX LANCETS MISC by Does not apply route.  Marland Kitchen acetaminophen (TYLENOL) 500  MG tablet Take 1,000 mg by mouth every 6 (six) hours as needed for moderate pain.  Marland Kitchen aspirin EC 81 MG tablet Take 81 mg by mouth daily.  . Blood Glucose Monitoring Suppl (ACCU-CHEK NANO SMARTVIEW) w/Device KIT 1 strip by Does not apply route.  . Cholecalciferol (VITAMIN D) 2000 units CAPS Take by mouth.  . Fish Oil OIL Take 1 capsule by mouth daily.  . hydrochlorothiazide (HYDRODIURIL) 25 MG tablet TAKE ONE TABLET BY MOUTH ONE TIME DAILY   . metoprolol (LOPRESSOR) 50 MG tablet Take 50 mg by mouth 2 (two) times daily.  Marland Kitchen olmesartan (BENICAR) 40 MG tablet Take 40 mg by mouth daily.  . fluticasone (FLONASE) 50 MCG/ACT nasal spray Place 1 spray into both nostrils daily as needed for allergies or rhinitis.  . pravastatin (PRAVACHOL) 40 MG tablet Take 1 tablet (40 mg total) by mouth daily.  . [  DISCONTINUED] hydrochlorothiazide (HYDRODIURIL) 25 MG tablet Take 25 mg by mouth daily.  . [DISCONTINUED] hydrochlorothiazide (HYDRODIURIL) 25 MG tablet TAKE ONE TABLET BY MOUTH ONE TIME DAILY   . [DISCONTINUED] hydrochlorothiazide (HYDRODIURIL) 25 MG tablet TAKE ONE TABLET BY MOUTH ONE TIME DAILY   . [DISCONTINUED] lisinopril (PRINIVIL,ZESTRIL) 2.5 MG tablet Take 1 tablet (2.5 mg total) by mouth daily.  . [DISCONTINUED] rosuvastatin (CRESTOR) 20 MG tablet Take 20 mg by mouth every Wednesday.   No facility-administered encounter medications on file as of 04/12/2018.     Activities of Daily Living In your present state of health, do you have any difficulty performing the following activities: 04/12/2018  Hearing? N  Vision? N  Difficulty concentrating or making decisions? Y  Walking or climbing stairs? N  Dressing or bathing? N  Doing errands, shopping? N  Preparing Food and eating ? N  Using the Toilet? N  In the past six months, have you accidently leaked urine? N  Do you have problems with loss of bowel control? N  Managing your Medications? N  Managing your Finances? N  Housekeeping or managing  your Housekeeping? N  Some recent data might be hidden    Patient Care Team: [name], MD as PCP - General (Internal Medicine)    Assessment:   This is a routine wellness examination for Crystalle.  Exercise Activities and Dietary recommendations Current Exercise Habits: Home exercise routine, Type of exercise: walking, Time (Minutes): 60, Frequency (Times/Week): 7, Weekly Exercise (Minutes/Week): 420, Intensity: Moderate  Goals    . DIET - EAT MORE FRUITS AND VEGETABLES    . Exercise 150 min/wk Moderate Activity       Fall Risk Fall Risk  04/12/2018 02/05/2018  Falls in the past year? 0 No   Is the patient's home free of loose throw rugs in walkways, pet beds, electrical cords, etc?   no      Grab bars in the bathroom? yes      Handrails on the stairs?   yes      Adequate lighting?   yes  Timed Get Up and Go performed: ambulatory without assistance  Depression Screen PHQ 2/9 Scores 04/12/2018 02/05/2018  PHQ - 2 Score 0 0     Cognitive Function     6CIT Screen 04/12/2018  What Year? 0 points  What month? 0 points  What time? 0 points  Count back from 20 0 points  Months in reverse 0 points  Repeat phrase 0 points  Total Score 0    Immunization History  Administered Date(s) Administered  . Influenza, High Dose Seasonal PF 02/05/2018    Qualifies for Shingles Vaccine? yes  Screening Tests Health Maintenance  Topic Date Due  . FOOT EXAM  04/11/1942  . OPHTHALMOLOGY EXAM  04/11/1942  . TETANUS/TDAP  04/11/1953  . DEXA SCAN  04/12/1999  . PNA vac Low Risk Adult (1 of 2 - PCV13) 04/12/1999  . HEMOGLOBIN A1C  02/02/2018  . INFLUENZA VACCINE  Completed    Cancer Screenings: Lung: Low Dose CT Chest recommended if Age 34-80 years, 30 pack-year currently smoking OR have quit w/in 15years. Patient DOES NOT qualify. Breast:  Up to date on Mammogram? YES   Up to date of Bone Density/Dexa? NO--Will order Colorectal: NO longer needed  Additional Screenings:   Hepatitis C Screening: N/A     Plan:   1. Medicare annual wellness visit, subsequent  A full exam was performed.  Importance of monthly self breast exams was discussed  with the patient. THE ANNUAL WELLNESS VISIT WAS PERFORMED INCLUDING DISCUSSION OF ADVANCED DIRECTIVES, ASSESSMENT OF COGNITIVE FUNCTION AND COMPLETION OF GERIATRIC FUNCTIONAL ASSESSMENT.  PATIENT HAS BEEN ADVISED TO GET 30-45 MINUTES REGULAR EXERCISE NO LESS THAN FOUR TO FIVE DAYS PER WEEK - BOTH WEIGHTBEARING EXERCISES AND AEROBIC ARE RECOMMENDED.  SHE IS ADVISED TO FOLLOW A HEALTHY DIET WITH AT LEAST SIX FRUITS/VEGGIES PER DAY, DECREASE INTAKE OF RED MEAT, AND TO INCREASE FISH INTAKE TO TWO DAYS PER WEEK.  MEATS/FISH SHOULD NOT BE FRIED, BAKED OR BROILED IS PREFERABLE.  I SUGGEST WEARING SPF 50 SUNSCREEN ON EXPOSED PARTS AND ESPECIALLY WHEN IN THE DIRECT SUNLIGHT FOR AN EXTENDED PERIOD OF TIME.  PLEASE AVOID FAST FOOD RESTAURANTS AND INCREASE YOUR WATER INTAKE.    2. Type 2 diabetes mellitus with stage 3 chronic kidney disease, without long-term current use of insulin ([place])  Diabetic foot exam was performed.  I DISCUSSED WITH THE PATIENT AT LENGTH REGARDING THE GOALS OF GLYCEMIC CONTROL AND POSSIBLE LONG-TERM COMPLICATIONS.  I  ALSO STRESSED THE IMPORTANCE OF COMPLIANCE WITH HOME GLUCOSE MONITORING, DIETARY RESTRICTIONS INCLUDING AVOIDANCE OF SUGARY DRINKS/PROCESSED FOODS,  ALONG WITH REGULAR EXERCISE.  I  ALSO STRESSED THE IMPORTANCE OF ANNUAL EYE EXAMS, SELF FOOT CARE AND COMPLIANCE WITH OFFICE VISITS.  - CMP14+EGFR - CBC - Lipid panel - Hemoglobin A1c - POCT Urinalysis Dipstick (81002) - POCT UA - Microalbumin  3. Hypertensive nephropathy  Fair control. She is advised that goal bp is less than 130/80. She will continue with current meds for now. She is encouraged to avoid adding salt to her foods.   - EKG 12-Lead  4. Estrogen deficiency  I will refer her to [facility] for dexa scan. She is encouraged to engage in weight  bearing exercises at least three days weekly. She is also encouraged to take calcium and vitamin D supplements.   5. Need for prophylactic vaccination with combined diphtheria-tetanus-pertussis (DTP) vaccine  A rx for Tdap was sent to her pharmacy. She was advised that the pharmacy can administer this vaccine to her.   I have personally reviewed and noted the following in the patient's chart:   . Medical and social history . Use of alcohol, tobacco or illicit drugs  . Current medications and supplements . Functional ability and status . Nutritional status . Physical activity . Advanced directives . List of other physicians . Hospitalizations, surgeries, and ER visits in previous 12 months . Vitals . Screenings to include cognitive, depression, and falls . Referrals and appointments  In addition, I have reviewed and discussed with patient certain preventive protocols, quality metrics, and best practice recommendations. A written personalized care plan for preventive services as well as general preventive health recommendations were provided to patient.     Maximino Greenland, MD  04/12/2018

## 2018-04-12 NOTE — Patient Instructions (Signed)
  Rebekah Avila , Thank you for taking time to come for your Medicare Wellness Visit. I appreciate your ongoing commitment to your health goals. Please review the following plan we discussed and let me know if I can assist you in the future.   These are the goals we discussed: Goals    . DIET - EAT MORE FRUITS AND VEGETABLES    . Exercise 150 min/wk Moderate Activity       This is a list of the screening recommended for you and due dates:  Health Maintenance  Topic Date Due  . Eye exam for diabetics  04/11/1942  . Pneumonia vaccines (2 of 2 - PPSV23) 06/16/2011  . Tetanus Vaccine  07/08/2015  . Hemoglobin A1C  02/02/2018  . Complete foot exam   04/13/2019  . Flu Shot  Completed  . DEXA scan (bone density measurement)  Completed

## 2018-04-13 ENCOUNTER — Other Ambulatory Visit: Payer: Self-pay | Admitting: Internal Medicine

## 2018-04-13 LAB — CMP14+EGFR
ALT: 28 IU/L (ref 0–32)
AST: 42 IU/L — ABNORMAL HIGH (ref 0–40)
Albumin/Globulin Ratio: 1.3 (ref 1.2–2.2)
Albumin: 4.6 g/dL (ref 3.5–4.7)
Alkaline Phosphatase: 57 IU/L (ref 39–117)
BUN/Creatinine Ratio: 12 (ref 12–28)
BUN: 12 mg/dL (ref 8–27)
Bilirubin Total: 0.8 mg/dL (ref 0.0–1.2)
CO2: 25 mmol/L (ref 20–29)
Calcium: 9.9 mg/dL (ref 8.7–10.3)
Chloride: 98 mmol/L (ref 96–106)
Creatinine, Ser: 1 mg/dL (ref 0.57–1.00)
GFR calc Af Amer: 60 mL/min/{1.73_m2} (ref >59)
GFR calc non Af Amer: 52 mL/min/{1.73_m2} — ABNORMAL LOW (ref >59)
Globulin, Total: 3.5 g/dL (ref 1.5–4.5)
Glucose: 100 mg/dL — ABNORMAL HIGH (ref 65–99)
Potassium: 3.7 mmol/L (ref 3.5–5.2)
Sodium: 141 mmol/L (ref 134–144)
Total Protein: 8.1 g/dL (ref 6.0–8.5)

## 2018-04-13 LAB — LIPID PANEL
Chol/HDL Ratio: 4.2 ratio (ref 0.0–4.4)
Cholesterol, Total: 189 mg/dL (ref 100–199)
HDL: 45 mg/dL (ref >39)
LDL Calculated: 116 mg/dL — ABNORMAL HIGH (ref 0–99)
Triglycerides: 139 mg/dL (ref 0–149)
VLDL Cholesterol Cal: 28 mg/dL (ref 5–40)

## 2018-04-13 LAB — CBC
Hematocrit: 34.3 % (ref 34.0–46.6)
Hemoglobin: 11.5 g/dL (ref 11.1–15.9)
MCH: 30.4 pg (ref 26.6–33.0)
MCHC: 33.5 g/dL (ref 31.5–35.7)
MCV: 91 fL (ref 79–97)
Platelets: 289 10*3/uL (ref 150–450)
RBC: 3.78 x10E6/uL (ref 3.77–5.28)
RDW: 12.9 % (ref 12.3–15.4)
WBC: 6.2 10*3/uL (ref 3.4–10.8)

## 2018-04-13 LAB — HEMOGLOBIN A1C
Est. average glucose Bld gHb Est-mCnc: 143 mg/dL
Hgb A1c MFr Bld: 6.6 % — ABNORMAL HIGH (ref 4.8–5.6)

## 2018-04-15 NOTE — Progress Notes (Signed)
Your liver enzymes are slightly elevated. Please increase your daily activity. Your kidney function is stable. Pls be sure to stay well hydrated. Your blood count is normal. Your LDL, bad chol is 116. Ideally, this should be less than 70 as a diabetic. Are you willing to take a cholesterol medication just once weekly? This could help to decrease your risk of heart disease while getting your chol to goal. If she agrees, pls send in pravastatin 40mg  once weekly. #12/zero refills. Needs f/u in six weeks (w/ me). Your hba1c is pretty good at 6.6. continue with current meds.

## 2018-04-27 ENCOUNTER — Telehealth: Payer: Self-pay

## 2018-04-27 NOTE — Telephone Encounter (Signed)
Patient notified see previous phone note.

## 2018-04-27 NOTE — Telephone Encounter (Signed)
-----   Message from Dorothyann Peng, MD sent at 04/22/2018  6:45 PM EST ----- I will wait since her liver enzymes are elevated. It is important that she start exercising to help LFTs improve. Also, if she is taking tylenol and/or ibuprofen regularly due to pain, this can cause elevated enzymes.

## 2018-04-27 NOTE — Telephone Encounter (Signed)
-----   Message from Robyn Sanders, MD sent at 04/22/2018  6:45 PM EST ----- I will wait since her liver enzymes are elevated. It is important that she start exercising to help LFTs improve. Also, if she is taking tylenol and/or ibuprofen regularly due to pain, this can cause elevated enzymes. 

## 2018-04-27 NOTE — Telephone Encounter (Signed)
Left the pt a message to call back so that I can give her the additional information regarding her cholesterol.

## 2018-05-16 ENCOUNTER — Other Ambulatory Visit: Payer: Self-pay | Admitting: Internal Medicine

## 2018-06-24 ENCOUNTER — Other Ambulatory Visit: Payer: Self-pay | Admitting: Internal Medicine

## 2018-07-12 ENCOUNTER — Other Ambulatory Visit: Payer: Self-pay | Admitting: Internal Medicine

## 2018-08-13 ENCOUNTER — Encounter: Payer: Self-pay | Admitting: Internal Medicine

## 2018-08-13 ENCOUNTER — Ambulatory Visit (INDEPENDENT_AMBULATORY_CARE_PROVIDER_SITE_OTHER): Payer: Medicare Other | Admitting: Internal Medicine

## 2018-08-13 ENCOUNTER — Other Ambulatory Visit: Payer: Self-pay

## 2018-08-13 VITALS — BP 154/80 | HR 63 | Temp 97.6°F | Ht 60.5 in | Wt 140.4 lb

## 2018-08-13 DIAGNOSIS — Z6826 Body mass index (BMI) 26.0-26.9, adult: Secondary | ICD-10-CM

## 2018-08-13 DIAGNOSIS — I129 Hypertensive chronic kidney disease with stage 1 through stage 4 chronic kidney disease, or unspecified chronic kidney disease: Secondary | ICD-10-CM | POA: Diagnosis not present

## 2018-08-13 DIAGNOSIS — E1122 Type 2 diabetes mellitus with diabetic chronic kidney disease: Secondary | ICD-10-CM

## 2018-08-13 DIAGNOSIS — M353 Polymyalgia rheumatica: Secondary | ICD-10-CM

## 2018-08-13 DIAGNOSIS — E7800 Pure hypercholesterolemia, unspecified: Secondary | ICD-10-CM

## 2018-08-13 DIAGNOSIS — N183 Chronic kidney disease, stage 3 unspecified: Secondary | ICD-10-CM

## 2018-08-13 NOTE — Patient Instructions (Signed)
Diabetes Mellitus and Exercise Exercising regularly is important for your overall health, especially when you have diabetes (diabetes mellitus). Exercising is not only about losing weight. It has many other health benefits, such as increasing muscle strength and bone density and reducing body fat and stress. This leads to improved fitness, flexibility, and endurance, all of which result in better overall health. Exercise has additional benefits for people with diabetes, including:  Reducing appetite.  Helping to lower and control blood glucose.  Lowering blood pressure.  Helping to control amounts of fatty substances (lipids) in the blood, such as cholesterol and triglycerides.  Helping the body to respond better to insulin (improving insulin sensitivity).  Reducing how much insulin the body needs.  Decreasing the risk for heart disease by: ? Lowering cholesterol and triglyceride levels. ? Increasing the levels of good cholesterol. ? Lowering blood glucose levels. What is my activity plan? Your health care provider or certified diabetes educator can help you make a plan for the type and frequency of exercise (activity plan) that works for you. Make sure that you:  Do at least 150 minutes of moderate-intensity or vigorous-intensity exercise each week. This could be brisk walking, biking, or water aerobics. ? Do stretching and strength exercises, such as yoga or weightlifting, at least 2 times a week. ? Spread out your activity over at least 3 days of the week.  Get some form of physical activity every day. ? Do not go more than 2 days in a row without some kind of physical activity. ? Avoid being inactive for more than 30 minutes at a time. Take frequent breaks to walk or stretch.  Choose a type of exercise or activity that you enjoy, and set realistic goals.  Start slowly, and gradually increase the intensity of your exercise over time. What do I need to know about managing my  diabetes?   Check your blood glucose before and after exercising. ? If your blood glucose is 240 mg/dL (13.3 mmol/L) or higher before you exercise, check your urine for ketones. If you have ketones in your urine, do not exercise until your blood glucose returns to normal. ? If your blood glucose is 100 mg/dL (5.6 mmol/L) or lower, eat a snack containing 15-20 grams of carbohydrate. Check your blood glucose 15 minutes after the snack to make sure that your level is above 100 mg/dL (5.6 mmol/L) before you start your exercise.  Know the symptoms of low blood glucose (hypoglycemia) and how to treat it. Your risk for hypoglycemia increases during and after exercise. Common symptoms of hypoglycemia can include: ? Hunger. ? Anxiety. ? Sweating and feeling clammy. ? Confusion. ? Dizziness or feeling light-headed. ? Increased heart rate or palpitations. ? Blurry vision. ? Tingling or numbness around the mouth, lips, or tongue. ? Tremors or shakes. ? Irritability.  Keep a rapid-acting carbohydrate snack available before, during, and after exercise to help prevent or treat hypoglycemia.  Avoid injecting insulin into areas of the body that are going to be exercised. For example, avoid injecting insulin into: ? The arms, when playing tennis. ? The legs, when jogging.  Keep records of your exercise habits. Doing this can help you and your health care provider adjust your diabetes management plan as needed. Write down: ? Food that you eat before and after you exercise. ? Blood glucose levels before and after you exercise. ? The type and amount of exercise you have done. ? When your insulin is expected to peak, if you use   insulin. Avoid exercising at times when your insulin is peaking.  When you start a new exercise or activity, work with your health care provider to make sure the activity is safe for you, and to adjust your insulin, medicines, or food intake as needed.  Drink plenty of water while  you exercise to prevent dehydration or heat stroke. Drink enough fluid to keep your urine clear or pale yellow. Summary  Exercising regularly is important for your overall health, especially when you have diabetes (diabetes mellitus).  Exercising has many health benefits, such as increasing muscle strength and bone density and reducing body fat and stress.  Your health care provider or certified diabetes educator can help you make a plan for the type and frequency of exercise (activity plan) that works for you.  When you start a new exercise or activity, work with your health care provider to make sure the activity is safe for you, and to adjust your insulin, medicines, or food intake as needed. This information is not intended to replace advice given to you by your health care provider. Make sure you discuss any questions you have with your health care provider. Document Released: 07/02/2003 Document Revised: 10/20/2016 Document Reviewed: 09/21/2015 Elsevier Interactive Patient Education  2019 [employer]  

## 2018-08-13 NOTE — Progress Notes (Signed)
Subjective:     Patient ID: Rebekah Avila , female    DOB: [birth year] , 86 y.o.   MRN: [id number]   Chief Complaint  Patient presents with  . Diabetes  . Hypertension    HPI  Diabetes  She presents for her follow-up diabetic visit. She has type 2 diabetes mellitus. Her disease course has been stable. There are no hypoglycemic associated symptoms. Pertinent negatives for diabetes include no blurred vision and no chest pain. There are no hypoglycemic complications. Risk factors for coronary artery disease include diabetes mellitus, dyslipidemia, hypertension, post-menopausal and sedentary lifestyle. She is following a diabetic diet. She participates in exercise intermittently. Her home blood glucose trend is fluctuating minimally. Her breakfast blood glucose is taken between 8-9 am. Her breakfast blood glucose range is generally 110-130 mg/dl. An ACE inhibitor/angiotensin II receptor blocker is being taken.  Hypertension  This is a chronic problem. The current episode started more than 1 year ago. The problem is uncontrolled. Pertinent negatives include no blurred vision or chest pain.   She admits she did not take her meds today.   Past Medical History:  Diagnosis Date  . Diabetes ([place])   . High cholesterol   . Hypertension   . Polymyalgia rheumatica (HCC)      Family History  Problem Relation Age of Onset  . Healthy Mother   . Heart attack Father      Current Outpatient Medications:  .  ACCU-CHEK FASTCLIX LANCETS MISC, by Does not apply route., Disp: , Rfl:  .  acetaminophen (TYLENOL) 500 MG tablet, Take 1,000 mg by mouth every 6 (six) hours as needed for moderate pain., Disp: , Rfl:  .  aspirin EC 81 MG tablet, Take 81 mg by mouth daily., Disp: , Rfl:  .  Blood Glucose Monitoring Suppl (ACCU-CHEK NANO SMARTVIEW) w/Device KIT, 1 strip by Does not apply route., Disp: , Rfl:  .  Cholecalciferol (VITAMIN D) 2000 units CAPS, Take by mouth., Disp: , Rfl:  .  Fish Oil OIL, Take 1  capsule by mouth daily., Disp: , Rfl:  .  fluticasone (FLONASE) 50 MCG/ACT nasal spray, Place 1 spray into both nostrils daily as needed for allergies or rhinitis., Disp: , Rfl:  .  hydrochlorothiazide (HYDRODIURIL) 25 MG tablet, TAKE ONE TABLET BY MOUTH ONE TIME DAILY , Disp: 90 tablet, Rfl: 0 .  metoprolol tartrate (LOPRESSOR) 50 MG tablet, TAKE ONE TABLET BY MOUTH TWICE DAILY , Disp: 180 tablet, Rfl: 0 .  olmesartan (BENICAR) 40 MG tablet, TAKE 1 TABLET BY MOUTH DAILY, Disp: 90 tablet, Rfl: 0 .  pravastatin (PRAVACHOL) 40 MG tablet, TAKE ONE TABLET BY MOUTH ONE TIME DAILY , Disp: 90 tablet, Rfl: 1   Allergies  Allergen Reactions  . Morphine And Related Rash     Review of Systems  Constitutional: Negative.   Eyes: Negative for blurred vision.  Respiratory: Negative.   Cardiovascular: Negative.  Negative for chest pain.  Gastrointestinal: Negative.   Neurological: Negative.   Psychiatric/Behavioral: Negative.      Today's Vitals   08/13/18 1046  BP: (!) 154/80  Pulse: 63  Temp: 97.6 F (36.4 C)  TempSrc: Oral  Weight: 140 lb 6.4 oz (63.7 kg)  Height: 5' 0.5" (1.537 m)  PainSc: 0-No pain   Body mass index is 26.97 kg/m.   Objective:  Physical Exam Vitals signs and nursing note reviewed.  Constitutional:      Appearance: Normal appearance.  HENT:     Head: Normocephalic and  atraumatic.  Cardiovascular:     Rate and Rhythm: Normal rate and regular rhythm.     Heart sounds: Normal heart sounds.  Pulmonary:     Effort: Pulmonary effort is normal.     Breath sounds: Normal breath sounds.  Skin:    General: Skin is warm.  Neurological:     General: No focal deficit present.     Mental Status: She is alert.  Psychiatric:        Mood and Affect: Mood normal.        Behavior: Behavior normal.         Assessment And Plan:     1. Type 2 diabetes mellitus with stage 3 chronic kidney disease, without long-term current use of insulin ([place])  I will check labs as  listed below.  Importance of regular exercise was discussed with the patient.   - Lipid panel - CMP14+EGFR - Hemoglobin A1c  2. Hypertensive nephropathy  Uncontrolled. Unfortunately, she did not take her meds today.   3. Pure hypercholesterolemia  I will check fasting lipid panel and LFTs today. She is encouraged to avoid fried foods.   4. Polymyalgia rheumatica (HCC)  Chronic. Her sx are controlled off of steroids. I will check labs as listed elow.   - CK, total  5. Adult BMI 26.0-26.9 kg/sq m  Her weight is stable.   Maximino Greenland, MD    THE PATIENT IS ENCOURAGED TO PRACTICE SOCIAL DISTANCING DUE TO THE COVID-19 PANDEMIC.

## 2018-08-14 ENCOUNTER — Telehealth: Payer: Self-pay

## 2018-08-14 LAB — CMP14+EGFR
ALT: 16 IU/L (ref 0–32)
AST: 29 IU/L (ref 0–40)
Albumin/Globulin Ratio: 1.3 (ref 1.2–2.2)
Albumin: 4.6 g/dL (ref 3.6–4.6)
Alkaline Phosphatase: 68 IU/L (ref 39–117)
BUN/Creatinine Ratio: 15 (ref 12–28)
BUN: 16 mg/dL (ref 8–27)
Bilirubin Total: 0.7 mg/dL (ref 0.0–1.2)
CO2: 24 mmol/L (ref 20–29)
Calcium: 9.9 mg/dL (ref 8.7–10.3)
Chloride: 100 mmol/L (ref 96–106)
Creatinine, Ser: 1.09 mg/dL — ABNORMAL HIGH (ref 0.57–1.00)
GFR calc Af Amer: 54 mL/min/{1.73_m2} — ABNORMAL LOW (ref >59)
GFR calc non Af Amer: 47 mL/min/{1.73_m2} — ABNORMAL LOW (ref >59)
Globulin, Total: 3.5 g/dL (ref 1.5–4.5)
Glucose: 129 mg/dL — ABNORMAL HIGH (ref 65–99)
Potassium: 3.8 mmol/L (ref 3.5–5.2)
Sodium: 140 mmol/L (ref 134–144)
Total Protein: 8.1 g/dL (ref 6.0–8.5)

## 2018-08-14 LAB — LIPID PANEL
Chol/HDL Ratio: 4.2 ratio (ref 0.0–4.4)
Cholesterol, Total: 174 mg/dL (ref 100–199)
HDL: 41 mg/dL (ref >39)
LDL Calculated: 110 mg/dL — ABNORMAL HIGH (ref 0–99)
Triglycerides: 116 mg/dL (ref 0–149)
VLDL Cholesterol Cal: 23 mg/dL (ref 5–40)

## 2018-08-14 LAB — CK: Total CK: 164 U/L — ABNORMAL HIGH (ref 26–161)

## 2018-08-14 LAB — HEMOGLOBIN A1C
Est. average glucose Bld gHb Est-mCnc: 143 mg/dL
Hgb A1c MFr Bld: 6.6 % — ABNORMAL HIGH (ref 4.8–5.6)

## 2018-08-14 NOTE — Telephone Encounter (Signed)
Left the patient a message to call back for lab results. 

## 2018-08-14 NOTE — Telephone Encounter (Signed)
-----   Message from Dorothyann Peng, MD sent at 08/14/2018 11:32 AM EDT ----- Your LDL, bad chol is 110. Ideally, this should be less than  70 for diabetics. Are you taking your chol meds? Your kidney fxn has decreased. pls increase your water intake. Your hba1c is stable. Your muscle enzymes are sl. Elevated. Again, pls increase your water intake .

## 2018-08-23 ENCOUNTER — Other Ambulatory Visit: Payer: Self-pay | Admitting: Internal Medicine

## 2018-10-01 ENCOUNTER — Other Ambulatory Visit: Payer: Self-pay | Admitting: Internal Medicine

## 2018-11-04 ENCOUNTER — Other Ambulatory Visit: Payer: Self-pay | Admitting: Internal Medicine

## 2018-12-17 ENCOUNTER — Other Ambulatory Visit: Payer: Self-pay

## 2018-12-17 ENCOUNTER — Encounter: Payer: Self-pay | Admitting: Internal Medicine

## 2018-12-17 ENCOUNTER — Ambulatory Visit (INDEPENDENT_AMBULATORY_CARE_PROVIDER_SITE_OTHER): Payer: Medicare Other | Admitting: Internal Medicine

## 2018-12-17 VITALS — BP 150/76 | HR 67 | Temp 98.7°F | Ht 60.5 in | Wt 136.8 lb

## 2018-12-17 DIAGNOSIS — N183 Chronic kidney disease, stage 3 unspecified: Secondary | ICD-10-CM

## 2018-12-17 DIAGNOSIS — E1122 Type 2 diabetes mellitus with diabetic chronic kidney disease: Secondary | ICD-10-CM | POA: Diagnosis not present

## 2018-12-17 DIAGNOSIS — E7800 Pure hypercholesterolemia, unspecified: Secondary | ICD-10-CM | POA: Diagnosis not present

## 2018-12-17 DIAGNOSIS — Z23 Encounter for immunization: Secondary | ICD-10-CM | POA: Diagnosis not present

## 2018-12-17 DIAGNOSIS — I129 Hypertensive chronic kidney disease with stage 1 through stage 4 chronic kidney disease, or unspecified chronic kidney disease: Secondary | ICD-10-CM | POA: Diagnosis not present

## 2018-12-17 MED ORDER — PRAVASTATIN SODIUM 40 MG PO TABS: 40.0000 mg | ORAL_TABLET | Freq: Every day | ORAL | 1 refills | Status: DC

## 2018-12-17 NOTE — Progress Notes (Signed)
Subjective:     Patient ID: Rebekah Avila , female    DOB: [birth year] , 85 y.o.   MRN: [id number]   Chief Complaint  Patient presents with  . Diabetes  . Hypertension  . Hyperlipidemia    HPI  Diabetes She presents for her follow-up diabetic visit. She has type 2 diabetes mellitus. Her disease course has been stable. There are no hypoglycemic associated symptoms. Pertinent negatives for diabetes include no blurred vision and no chest pain. There are no hypoglycemic complications. Risk factors for coronary artery disease include diabetes mellitus, dyslipidemia, hypertension, post-menopausal and sedentary lifestyle. She is following a diabetic diet. She participates in exercise intermittently. Her home blood glucose trend is fluctuating minimally. Her breakfast blood glucose is taken between 8-9 am. Her breakfast blood glucose range is generally 110-130 mg/dl. An ACE inhibitor/angiotensin II receptor blocker is being taken.  Hypertension This is a chronic problem. The current episode started more than 1 year ago. The problem is uncontrolled. Pertinent negatives include no blurred vision, chest pain or shortness of breath. Past treatments include angiotensin blockers and diuretics. The current treatment provides moderate improvement. Compliance problems include exercise.  Hypertensive end-organ damage includes kidney disease.  Hyperlipidemia This is a chronic problem. The problem is controlled. Exacerbating diseases include diabetes. Pertinent negatives include no chest pain or shortness of breath. Current antihyperlipidemic treatment includes statins. Risk factors for coronary artery disease include diabetes mellitus, dyslipidemia, hypertension and post-menopausal.     Past Medical History:  Diagnosis Date  . Diabetes ([place])   . High cholesterol   . Hypertension   . Polymyalgia rheumatica (HCC)      Family History  Problem Relation Age of Onset  . Healthy Mother   . Heart attack Father       Current Outpatient Medications:  .  ACCU-CHEK FASTCLIX LANCETS MISC, by Does not apply route., Disp: , Rfl:  .  acetaminophen (TYLENOL) 500 MG tablet, Take 1,000 mg by mouth every 6 (six) hours as needed for moderate pain., Disp: , Rfl:  .  aspirin EC 81 MG tablet, Take 81 mg by mouth daily., Disp: , Rfl:  .  Blood Glucose Monitoring Suppl (ACCU-CHEK NANO SMARTVIEW) w/Device KIT, 1 strip by Does not apply route., Disp: , Rfl:  .  Cholecalciferol (VITAMIN D) 2000 units CAPS, Take by mouth., Disp: , Rfl:  .  Fish Oil OIL, Take 1 capsule by mouth daily., Disp: , Rfl:  .  fluticasone (FLONASE) 50 MCG/ACT nasal spray, Place 1 spray into both nostrils daily as needed for allergies or rhinitis., Disp: , Rfl:  .  hydrochlorothiazide (HYDRODIURIL) 25 MG tablet, TAKE ONE TABLET BY MOUTH ONE TIME DAILY , Disp: 90 tablet, Rfl: 0 .  metoprolol tartrate (LOPRESSOR) 50 MG tablet, TAKE ONE TABLET BY MOUTH TWICE DAILY , Disp: 180 tablet, Rfl: 0 .  olmesartan (BENICAR) 40 MG tablet, TAKE ONE TABLET BY MOUTH ONE TIME DAILY , Disp: 90 tablet, Rfl: 0 .  pravastatin (PRAVACHOL) 40 MG tablet, Take 1 tablet (40 mg total) by mouth daily., Disp: 90 tablet, Rfl: 1   Allergies  Allergen Reactions  . Morphine And Related Rash     Review of Systems  Constitutional: Negative.   Eyes: Negative for blurred vision.  Respiratory: Negative.  Negative for shortness of breath.   Cardiovascular: Negative.  Negative for chest pain.  Gastrointestinal: Negative.   Neurological: Negative.   Psychiatric/Behavioral: Negative.      Today's Vitals   12/17/18 1026  BP: Marland Kitchen)  150/76  Pulse: 67  Temp: 98.7 F (37.1 C)  TempSrc: Oral  Weight: 136 lb 12.8 oz (62.1 kg)  Height: 5' 0.5" (1.537 m)  PainSc: 0-No pain   Body mass index is 26.28 kg/m.   Objective:  Physical Exam Vitals signs and nursing note reviewed.  Constitutional:      Appearance: Normal appearance.  HENT:     Head: Normocephalic and atraumatic.   Cardiovascular:     Rate and Rhythm: Normal rate and regular rhythm.     Heart sounds: Normal heart sounds.  Pulmonary:     Effort: Pulmonary effort is normal.     Breath sounds: Normal breath sounds.  Skin:    General: Skin is warm.  Neurological:     General: No focal deficit present.     Mental Status: She is alert.  Psychiatric:        Mood and Affect: Mood normal.        Behavior: Behavior normal.         Assessment And Plan:     1. Type 2 diabetes mellitus with stage 3 chronic kidney disease, without long-term current use of insulin ([place])  I will check labs as listed below.   - BMP8+EGFR - Hemoglobin A1c  2. Hypertensive nephropathy  She did bring in home readings - most readings are between 130-139/80s, some are in 120s/80s.  She reports she is anxious when coming to the doctor. She will continue with current meds for now. She is encouraged to avoid packaged foods which tend to be high in sodium.   3. Pure hypercholesterolemia  Chronic. She was given refill of pravastatin. Her LDL is not yet at goal, yet she reports compliance with meds. I do not think it is wise to increase meds due to h/o myalgias with statin use.   4. Encounter for immunization  - Flu vaccine HIGH DOSE PF (Fluzone High dose)        Maximino Greenland, MD    THE PATIENT IS ENCOURAGED TO PRACTICE SOCIAL DISTANCING DUE TO THE COVID-19 PANDEMIC.

## 2018-12-17 NOTE — Patient Instructions (Signed)
Diabetes Mellitus and Foot Care Foot care is an important part of your health, especially when you have diabetes. Diabetes may cause you to have problems because of poor blood flow (circulation) to your feet and legs, which can cause your skin to:  Become thinner and drier.  Break more easily.  Heal more slowly.  Peel and crack. You may also have nerve damage (neuropathy) in your legs and feet, causing decreased feeling in them. This means that you may not notice minor injuries to your feet that could lead to more serious problems. Noticing and addressing any potential problems early is the best way to prevent future foot problems. How to care for your feet Foot hygiene  Wash your feet daily with warm water and mild soap. Do not use hot water. Then, pat your feet and the areas between your toes until they are completely dry. Do not soak your feet as this can dry your skin.  Trim your toenails straight across. Do not dig under them or around the cuticle. File the edges of your nails with an emery board or nail file.  Apply a moisturizing lotion or petroleum jelly to the skin on your feet and to dry, brittle toenails. Use lotion that does not contain alcohol and is unscented. Do not apply lotion between your toes. Shoes and socks  Wear clean socks or stockings every day. Make sure they are not too tight. Do not wear knee-high stockings since they may decrease blood flow to your legs.  Wear shoes that fit properly and have enough cushioning. Always look in your shoes before you put them on to be sure there are no objects inside.  To break in new shoes, wear them for just a few hours a day. This prevents injuries on your feet. Wounds, scrapes, corns, and calluses  Check your feet daily for blisters, cuts, bruises, sores, and redness. If you cannot see the bottom of your feet, use a mirror or ask someone for help.  Do not cut corns or calluses or try to remove them with medicine.  If you  find a minor scrape, cut, or break in the skin on your feet, keep it and the skin around it clean and dry. You may clean these areas with mild soap and water. Do not clean the area with peroxide, alcohol, or iodine.  If you have a wound, scrape, corn, or callus on your foot, look at it several times a day to make sure it is healing and not infected. Check for: ? Redness, swelling, or pain. ? Fluid or blood. ? Warmth. ? Pus or a bad smell. General instructions  Do not cross your legs. This may decrease blood flow to your feet.  Do not use heating pads or hot water bottles on your feet. They may burn your skin. If you have lost feeling in your feet or legs, you may not know this is happening until it is too late.  Protect your feet from hot and cold by wearing shoes, such as at the beach or on hot pavement.  Schedule a complete foot exam at least once a year (annually) or more often if you have foot problems. If you have foot problems, report any cuts, sores, or bruises to your health care provider immediately. Contact a health care provider if:  You have a medical condition that increases your risk of infection and you have any cuts, sores, or bruises on your feet.  You have an injury that is not   healing.  You have redness on your legs or feet.  You feel burning or tingling in your legs or feet.  You have pain or cramps in your legs and feet.  Your legs or feet are numb.  Your feet always feel cold.  You have pain around a toenail. Get help right away if:  You have a wound, scrape, corn, or callus on your foot and: ? You have pain, swelling, or redness that gets worse. ? You have fluid or blood coming from the wound, scrape, corn, or callus. ? Your wound, scrape, corn, or callus feels warm to the touch. ? You have pus or a bad smell coming from the wound, scrape, corn, or callus. ? You have a fever. ? You have a red line going up your leg. Summary  Check your feet every day  for cuts, sores, red spots, swelling, and blisters.  Moisturize feet and legs daily.  Wear shoes that fit properly and have enough cushioning.  If you have foot problems, report any cuts, sores, or bruises to your health care provider immediately.  Schedule a complete foot exam at least once a year (annually) or more often if you have foot problems. This information is not intended to replace advice given to you by your health care provider. Make sure you discuss any questions you have with your health care provider. Document Released: 04/08/2000 Document Revised: 05/24/2017 Document Reviewed: 05/13/2016 Elsevier Patient Education  2020 Else[employer] Inc[employer]  

## 2018-12-18 LAB — BMP8+EGFR
BUN/Creatinine Ratio: 12 (ref 12–28)
BUN: 13 mg/dL (ref 8–27)
CO2: 25 mmol/L (ref 20–29)
Calcium: 10.2 mg/dL (ref 8.7–10.3)
Chloride: 100 mmol/L (ref 96–106)
Creatinine, Ser: 1.09 mg/dL — ABNORMAL HIGH (ref 0.57–1.00)
GFR calc Af Amer: 54 mL/min/{1.73_m2} — ABNORMAL LOW (ref >59)
GFR calc non Af Amer: 47 mL/min/{1.73_m2} — ABNORMAL LOW (ref >59)
Glucose: 119 mg/dL — ABNORMAL HIGH (ref 65–99)
Potassium: 3.7 mmol/L (ref 3.5–5.2)
Sodium: 140 mmol/L (ref 134–144)

## 2018-12-18 LAB — HEMOGLOBIN A1C
Est. average glucose Bld gHb Est-mCnc: 143 mg/dL
Hgb A1c MFr Bld: 6.6 % — ABNORMAL HIGH (ref 4.8–5.6)

## 2018-12-19 ENCOUNTER — Telehealth: Payer: Self-pay

## 2018-12-19 NOTE — Telephone Encounter (Signed)
Left the patient a message to call back for lab results. 

## 2018-12-19 NOTE — Telephone Encounter (Signed)
-----   Message from [name], MD sent at 12/18/2018  7:08 PM EDT ----- Your kidney fxn is stable. Your hba1c is 6.6, this is stable as well. Stay safe!

## 2018-12-30 ENCOUNTER — Other Ambulatory Visit: Payer: Self-pay | Admitting: Internal Medicine

## 2019-01-03 DIAGNOSIS — H11823 Conjunctivochalasis, bilateral: Secondary | ICD-10-CM | POA: Diagnosis not present

## 2019-01-03 DIAGNOSIS — E119 Type 2 diabetes mellitus without complications: Secondary | ICD-10-CM | POA: Diagnosis not present

## 2019-01-03 DIAGNOSIS — H04123 Dry eye syndrome of bilateral lacrimal glands: Secondary | ICD-10-CM | POA: Diagnosis not present

## 2019-01-03 LAB — HM DIABETES EYE EXAM

## 2019-01-05 ENCOUNTER — Encounter: Payer: Self-pay | Admitting: Internal Medicine

## 2019-02-25 ENCOUNTER — Other Ambulatory Visit: Payer: Self-pay | Admitting: Internal Medicine

## 2019-03-26 ENCOUNTER — Other Ambulatory Visit: Payer: Self-pay | Admitting: Internal Medicine

## 2019-04-17 ENCOUNTER — Ambulatory Visit (INDEPENDENT_AMBULATORY_CARE_PROVIDER_SITE_OTHER): Payer: Medicare Other | Admitting: Internal Medicine

## 2019-04-17 ENCOUNTER — Encounter: Payer: Self-pay | Admitting: Internal Medicine

## 2019-04-17 ENCOUNTER — Ambulatory Visit (INDEPENDENT_AMBULATORY_CARE_PROVIDER_SITE_OTHER): Payer: Medicare Other

## 2019-04-17 ENCOUNTER — Other Ambulatory Visit: Payer: Self-pay

## 2019-04-17 VITALS — BP 140/88 | HR 84 | Temp 97.9°F | Ht 61.0 in | Wt 138.4 lb

## 2019-04-17 VITALS — BP 140/88 | HR 84 | Temp 97.9°F | Ht 60.6 in | Wt 138.4 lb

## 2019-04-17 DIAGNOSIS — E663 Overweight: Secondary | ICD-10-CM

## 2019-04-17 DIAGNOSIS — Z0000 Encounter for general adult medical examination without abnormal findings: Secondary | ICD-10-CM

## 2019-04-17 DIAGNOSIS — M353 Polymyalgia rheumatica: Secondary | ICD-10-CM | POA: Diagnosis not present

## 2019-04-17 DIAGNOSIS — N183 Chronic kidney disease, stage 3 unspecified: Secondary | ICD-10-CM | POA: Diagnosis not present

## 2019-04-17 DIAGNOSIS — I129 Hypertensive chronic kidney disease with stage 1 through stage 4 chronic kidney disease, or unspecified chronic kidney disease: Secondary | ICD-10-CM | POA: Diagnosis not present

## 2019-04-17 DIAGNOSIS — Z6826 Body mass index (BMI) 26.0-26.9, adult: Secondary | ICD-10-CM

## 2019-04-17 DIAGNOSIS — Z23 Encounter for immunization: Secondary | ICD-10-CM

## 2019-04-17 DIAGNOSIS — E1122 Type 2 diabetes mellitus with diabetic chronic kidney disease: Secondary | ICD-10-CM | POA: Diagnosis not present

## 2019-04-17 LAB — POCT URINALYSIS DIPSTICK
Bilirubin, UA: NEGATIVE
Blood, UA: NEGATIVE
Glucose, UA: NEGATIVE
Ketones, UA: NEGATIVE
Leukocytes, UA: NEGATIVE
Nitrite, UA: NEGATIVE
Protein, UA: NEGATIVE
Spec Grav, UA: 1.025 (ref 1.010–1.025)
Urobilinogen, UA: 0.2 E.U./dL
pH, UA: 6 (ref 5.0–8.0)

## 2019-04-17 LAB — POCT UA - MICROALBUMIN
Albumin/Creatinine Ratio, Urine, POC: 300
Creatinine, POC: 300 mg/dL
Microalbumin Ur, POC: 80 mg/L

## 2019-04-17 MED ORDER — BOOSTRIX 5-2.5-18.5 LF-MCG/0.5 IM SUSP: 0.5000 mL | Freq: Once | INTRAMUSCULAR | 0 refills | Status: AC

## 2019-04-17 MED ORDER — PREVNAR 13 IM SUSP: 0.5000 mL | INTRAMUSCULAR | 0 refills | Status: AC

## 2019-04-17 NOTE — Patient Instructions (Signed)
Rebekah Avila , Thank you for taking time to come for your Medicare Wellness Visit. I appreciate your ongoing commitment to your health goals. Please review the following plan we discussed and let me know if I can assist you in the future.   Screening recommendations/referrals: Colonoscopy: not required Mammogram: not required Bone Density: 03/2016 Recommended yearly ophthalmology/optometry visit for glaucoma screening and checkup Recommended yearly dental visit for hygiene and checkup  Vaccinations: Influenza vaccine: 11/2018 Pneumococcal vaccine: sent to pharmacy Tdap vaccine: sent to pharmacy Shingles vaccine: discussed    Advanced directives: Please bring a copy of your POA (Power of [place]) and/or Living Will to your next appointment.    Conditions/risks identified: overweight  Next appointment: 08/19/2019 at 10:30   Preventive Care 65 Years and Older, Female Preventive care refers to lifestyle choices and visits with your health care provider that can promote health and wellness. What does preventive care include?  A yearly physical exam. This is also called an annual well check.  Dental exams once or twice a year.  Routine eye exams. Ask your health care provider how often you should have your eyes checked.  Personal lifestyle choices, including:  Daily care of your teeth and gums.  Regular physical activity.  Eating a healthy diet.  Avoiding tobacco and drug use.  Limiting alcohol use.  Practicing safe sex.  Taking low-dose aspirin every day.  Taking vitamin and mineral supplements as recommended by your health care provider. What happens during an annual well check? The services and screenings done by your health care provider during your annual well check will depend on your age, overall health, lifestyle risk factors, and family history of disease. Counseling  Your health care provider may ask you questions about your:  Alcohol use.  Tobacco  use.  Drug use.  Emotional well-being.  Home and relationship well-being.  Sexual activity.  Eating habits.  History of falls.  Memory and ability to understand (cognition).  Work and work Statistician.  Reproductive health. Screening  You may have the following tests or measurements:  Height, weight, and BMI.  Blood pressure.  Lipid and cholesterol levels. These may be checked every 5 years, or more frequently if you are over 29 years old.  Skin check.  Lung cancer screening. You may have this screening every year starting at age 27 if you have a 30-pack-year history of smoking and currently smoke or have quit within the past 15 years.  Fecal occult blood test (FOBT) of the stool. You may have this test every year starting at age 77.  Flexible sigmoidoscopy or colonoscopy. You may have a sigmoidoscopy every 5 years or a colonoscopy every 10 years starting at age 57.  Hepatitis C blood test.  Hepatitis B blood test.  Sexually transmitted disease (STD) testing.  Diabetes screening. This is done by checking your blood sugar (glucose) after you have not eaten for a while (fasting). You may have this done every 1-3 years.  Bone density scan. This is done to screen for osteoporosis. You may have this done starting at age 30.  Mammogram. This may be done every 1-2 years. Talk to your health care provider about how often you should have regular mammograms. Talk with your health care provider about your test results, treatment options, and if necessary, the need for more tests. Vaccines  Your health care provider may recommend certain vaccines, such as:  Influenza vaccine. This is recommended every year.  Tetanus, diphtheria, and acellular pertussis (Tdap, Td) vaccine.  You may need a Td booster every 10 years.  Zoster vaccine. You may need this after age 64.  Pneumococcal 13-valent conjugate (PCV13) vaccine. One dose is recommended after age 60.  Pneumococcal  polysaccharide (PPSV23) vaccine. One dose is recommended after age 23. Talk to your health care provider about which screenings and vaccines you need and how often you need them. This information is not intended to replace advice given to you by your health care provider. Make sure you discuss any questions you have with your health care provider. Document Released: 05/08/2015 Document Revised: 12/30/2015 Document Reviewed: 02/10/2015 Elsevier Interactive Patient Education  2017 [employer] Prevention in the Home Falls can cause injuries. They can happen to people of all ages. There are many things you can do to make your home safe and to help prevent falls. What can I do on the outside of my home?  Regularly fix the edges of walkways and driveways and fix any cracks.  Remove anything that might make you trip as you walk through a door, such as a raised step or threshold.  Trim any bushes or trees on the path to your home.  Use bright outdoor lighting.  Clear any walking paths of anything that might make someone trip, such as rocks or tools.  Regularly check to see if handrails are loose or broken. Make sure that both sides of any steps have handrails.  Any raised decks and porches should have guardrails on the edges.  Have any leaves, snow, or ice cleared regularly.  Use sand or salt on walking paths during winter.  Clean up any spills in your garage right away. This includes oil or grease spills. What can I do in the bathroom?  Use night lights.  Install grab bars by the toilet and in the tub and shower. Do not use towel bars as grab bars.  Use non-skid mats or decals in the tub or shower.  If you need to sit down in the shower, use a plastic, non-slip stool.  Keep the floor dry. Clean up any water that spills on the floor as soon as it happens.  Remove soap buildup in the tub or shower regularly.  Attach bath mats securely with double-sided non-slip rug  tape.  Do not have throw rugs and other things on the floor that can make you trip. What can I do in the bedroom?  Use night lights.  Make sure that you have a light by your bed that is easy to reach.  Do not use any sheets or blankets that are too big for your bed. They should not hang down onto the floor.  Have a firm chair that has side arms. You can use this for support while you get dressed.  Do not have throw rugs and other things on the floor that can make you trip. What can I do in the kitchen?  Clean up any spills right away.  Avoid walking on wet floors.  Keep items that you use a lot in easy-to-reach places.  If you need to reach something above you, use a strong step stool that has a grab bar.  Keep electrical cords out of the way.  Do not use floor polish or wax that makes floors slippery. If you must use wax, use non-skid floor wax.  Do not have throw rugs and other things on the floor that can make you trip. What can I do with my stairs?  Do not leave any  items on the stairs.  Make sure that there are handrails on both sides of the stairs and use them. Fix handrails that are broken or loose. Make sure that handrails are as long as the stairways.  Check any carpeting to make sure that it is firmly attached to the stairs. Fix any carpet that is loose or worn.  Avoid having throw rugs at the top or bottom of the stairs. If you do have throw rugs, attach them to the floor with carpet tape.  Make sure that you have a light switch at the top of the stairs and the bottom of the stairs. If you do not have them, ask someone to add them for you. What else can I do to help prevent falls?  Wear shoes that:  Do not have high heels.  Have rubber bottoms.  Are comfortable and fit you well.  Are closed at the toe. Do not wear sandals.  If you use a stepladder:  Make sure that it is fully opened. Do not climb a closed stepladder.  Make sure that both sides of the  stepladder are locked into place.  Ask someone to hold it for you, if possible.  Clearly mark and make sure that you can see:  Any grab bars or handrails.  First and last steps.  Where the edge of each step is.  Use tools that help you move around (mobility aids) if they are needed. These include:  Canes.  Walkers.  Scooters.  Crutches.  Turn on the lights when you go into a dark area. Replace any light bulbs as soon as they burn out.  Set up your furniture so you have a clear path. Avoid moving your furniture around.  If any of your floors are uneven, fix them.  If there are any pets around you, be aware of where they are.  Review your medicines with your doctor. Some medicines can make you feel dizzy. This can increase your chance of falling. Ask your doctor what other things that you can do to help prevent falls. This information is not intended to replace advice given to you by your health care provider. Make sure you discuss any questions you have with your health care provider. Document Released: 02/05/2009 Document Revised: 09/17/2015 Document Reviewed: 05/16/2014 Elsevier Interactive Patient Education  2017 [employer].

## 2019-04-17 NOTE — Progress Notes (Signed)
This visit occurred during the SARS-CoV-2 public health emergency.  Safety protocols were in place, including screening questions prior to the visit, additional usage of staff PPE, and extensive cleaning of exam room while observing appropriate contact time as indicated for disinfecting solutions.  Subjective:   Rebekah Avila is a 85 y.o. female who presents for Medicare Annual (Subsequent) preventive examination.  Review of Systems:  n/a Cardiac Risk Factors include: advanced age (>26mn, >>3women);diabetes mellitus;hypertension;sedentary lifestyle     Objective:     Vitals: BP 140/88 (BP Location: Left Arm, Patient Position: Sitting)   Pulse 84   Temp 97.9 F (36.6 C) (Oral)   Ht '5\' 1"'  (1.549 m)   Wt 138 lb 6.4 oz (62.8 kg)   BMI 26.15 kg/m   Body mass index is 26.15 kg/m.  Advanced Directives 04/17/2019 06/30/2013  Does Patient Have a Medical Advance Directive? Yes Patient has advance directive, copy not in chart  Type of Advance Directive Healthcare Power of AByersLiving will  Does patient want to make changes to medical advance directive? - No change requested  Copy of [place] Chart? No - copy requested Copy requested from family  Pre-existing out of facility DNR order (yellow form or pink MOST form) - No    Tobacco Social History   Tobacco Use  Smoking Status Never Smoker  Smokeless Tobacco Never Used     Counseling given: Not Answered   Clinical Intake:  Pre-visit preparation completed: Yes  Pain : No/denies pain     Nutritional Status: BMI 25 -29 Overweight Nutritional Risks: None Diabetes: Yes CBG done?: No Did pt. bring in CBG monitor from home?: No  How often do you need to have someone help you when you read instructions, pamphlets, or other written materials from your doctor or pharmacy?: 1 - Never What is the last grade level you completed in school?: BS nursing  Interpreter Needed?:  No  Information entered by :: NAllen LPN  Past Medical History:  Diagnosis Date  . Diabetes ([place]   . High cholesterol   . Hypertension   . Polymyalgia rheumatica (HCC)    Past Surgical History:  Procedure Laterality Date  . ECTOPIC PREGNANCY SURGERY    . HERNIA REPAIR     Family History  Problem Relation Age of Onset  . Healthy Mother   . Heart attack Father    Social History   Socioeconomic History  . Marital status: Single    Spouse name: Not on file  . Number of children: Not on file  . Years of education: Not on file  . Highest education level: Not on file  Occupational History  . Occupation: retired  Tobacco Use  . Smoking status: Never Smoker  . Smokeless tobacco: Never Used  Substance and Sexual Activity  . Alcohol use: No  . Drug use: No  . Sexual activity: Not Currently  Other Topics Concern  . Not on file  Social History Narrative  . Not on file   Social Determinants of Health   Financial Resource Strain: Low Risk   . Difficulty of Paying Living Expenses: Not hard at all  Food Insecurity: No Food Insecurity  . Worried About RCharity fundraiserin the Last Year: Never true  . Ran Out of Food in the Last Year: Never true  Transportation Needs: No Transportation Needs  . Lack of Transportation (Medical): No  . Lack of Transportation (Non-Medical): No  Physical Activity:  Inactive  . Days of Exercise per Week: 0 days  . Minutes of Exercise per Session: 0 min  Stress: No Stress Concern Present  . Feeling of Stress : Not at all  Social Connections:   . Frequency of Communication with Friends and Family: Not on file  . Frequency of Social Gatherings with Friends and Family: Not on file  . Attends Religious Services: Not on file  . Active Member of Clubs or Organizations: Not on file  . Attends Archivist Meetings: Not on file  . Marital Status: Not on file    Outpatient Encounter Medications as of 04/17/2019  Medication Sig  .  ACCU-CHEK FASTCLIX LANCETS MISC by Does not apply route.  Marland Kitchen acetaminophen (TYLENOL) 500 MG tablet Take 1,000 mg by mouth every 6 (six) hours as needed for moderate pain.  Marland Kitchen aspirin EC 81 MG tablet Take 81 mg by mouth daily.  . Blood Glucose Monitoring Suppl (ACCU-CHEK NANO SMARTVIEW) w/Device KIT 1 strip by Does not apply route.  . Cholecalciferol (VITAMIN D) 2000 units CAPS Take by mouth.  . Fish Oil OIL Take 1 capsule by mouth daily.  . fluticasone (FLONASE) 50 MCG/ACT nasal spray Place 1 spray into both nostrils daily as needed for allergies or rhinitis.  . hydrochlorothiazide (HYDRODIURIL) 25 MG tablet TAKE ONE TABLET BY MOUTH ONE TIME DAILY   . metoprolol tartrate (LOPRESSOR) 50 MG tablet TAKE ONE TABLET BY MOUTH TWICE DAILY   . olmesartan (BENICAR) 40 MG tablet TAKE ONE TABLET BY MOUTH ONE TIME DAILY   . pneumococcal 13-valent conjugate vaccine (PREVNAR 13) SUSP injection Inject 0.5 mLs into the muscle tomorrow at 10 am for 1 dose.  . pravastatin (PRAVACHOL) 40 MG tablet Take 1 tablet (40 mg total) by mouth daily.  . Tdap (BOOSTRIX) 5-2.5-18.5 LF-MCG/0.5 injection Inject 0.5 mLs into the muscle once for 1 dose.   No facility-administered encounter medications on file as of 04/17/2019.    Activities of Daily Living In your present state of health, do you have any difficulty performing the following activities: 04/17/2019 04/17/2019  Hearing? N N  Vision? N N  Difficulty concentrating or making decisions? N N  Walking or climbing stairs? N N  Dressing or bathing? N N  Doing errands, shopping? N N  Preparing Food and eating ? N -  Using the Toilet? N -  In the past six months, have you accidently leaked urine? N -  Do you have problems with loss of bowel control? N -  Managing your Medications? N -  Managing your Finances? N -  Housekeeping or managing your Housekeeping? N -  Some recent data might be hidden    Patient Care Team: [name], MD as PCP - General (Internal  Medicine)    Assessment:   This is a routine wellness examination for Rebekah Avila.  Exercise Activities and Dietary recommendations Current Exercise Habits: The patient does not participate in regular exercise at present  Goals    . DIET - EAT MORE FRUITS AND VEGETABLES    . Exercise 150 min/wk Moderate Activity    . Patient Stated     04/17/2019, wants to continue with previous goals to eat more fruits and vegetables and exercise regularly       Fall Risk Fall Risk  04/17/2019 04/17/2019 12/17/2018 04/12/2018 02/05/2018  Falls in the past year? 0 0 0 0 No  Risk for fall due to : Medication side effect - - - -  Follow up Falls  evaluation completed;Education provided;Falls prevention discussed - - - -   Is the patient's home free of loose throw rugs in walkways, pet beds, electrical cords, etc?   yes      Grab bars in the bathroom? no      Handrails on the stairs?   yes      Adequate lighting?   yes  Timed Get Up and Go performed: n/a  Depression Screen PHQ 2/9 Scores 04/17/2019 04/17/2019 04/12/2018 02/05/2018  PHQ - 2 Score 0 0 0 0  PHQ- 9 Score 0 - - -     Cognitive Function     6CIT Screen 04/17/2019 04/12/2018  What Year? 0 points 0 points  What month? 0 points 0 points  What time? 0 points 0 points  Count back from 20 0 points 0 points  Months in reverse 0 points 0 points  Repeat phrase 8 points 0 points  Total Score 8 0    Immunization History  Administered Date(s) Administered  . Influenza, High Dose Seasonal PF 02/05/2018, 12/17/2018    Qualifies for Shingles Vaccine?yes  Screening Tests Health Maintenance  Topic Date Due  . PNA vac Low Risk Adult (2 of 2 - PPSV23) 06/16/2011  . TETANUS/TDAP  07/08/2015  . HEMOGLOBIN A1C  06/19/2019  . OPHTHALMOLOGY EXAM  01/03/2020  . FOOT EXAM  04/16/2020  . INFLUENZA VACCINE  Completed  . DEXA SCAN  Completed    Cancer Screenings: Lung: Low Dose CT Chest recommended if Age 78-80 years, 30 pack-year currently  smoking OR have quit w/in 15years. Patient does not qualify. Breast:  Up to date on Mammogram? Yes   Up to date of Bone Density/Dexa? Yes Colorectal: not required  Additional Screenings: : Hepatitis C Screening: n/a     Plan:    Patient wants to eat more fruits and vegetables and exercise regularly.   I have personally reviewed and noted the following in the patient's chart:   . Medical and social history . Use of alcohol, tobacco or illicit drugs  . Current medications and supplements . Functional ability and status . Nutritional status . Physical activity . Advanced directives . List of other physicians . Hospitalizations, surgeries, and ER visits in previous 12 months . Vitals . Screenings to include cognitive, depression, and falls . Referrals and appointments  In addition, I have reviewed and discussed with patient certain preventive protocols, quality metrics, and best practice recommendations. A written personalized care plan for preventive services as well as general preventive health recommendations were provided to patient.     [name], LPN  [id number]

## 2019-04-17 NOTE — Progress Notes (Signed)
This visit occurred during the SARS-CoV-2 public health emergency.  Safety protocols were in place, including screening questions prior to the visit, additional usage of staff PPE, and extensive cleaning of exam room while observing appropriate contact time as indicated for disinfecting solutions.  Subjective:     Patient ID: Rebekah Avila , female    DOB: 1931-07-19 , 85 y.o.   MRN: [id number]   Chief Complaint  Patient presents with  . Annual Exam  . Diabetes  . Hypertension    HPI  She is here today for a full physical examination.  She feels well and is without any complaints.   Diabetes She presents for her follow-up diabetic visit. She has type 2 diabetes mellitus. Her disease course has been stable. There are no hypoglycemic associated symptoms. Pertinent negatives for diabetes include no blurred vision. There are no hypoglycemic complications. Risk factors for coronary artery disease include diabetes mellitus, dyslipidemia, hypertension, post-menopausal and sedentary lifestyle. She is following a diabetic diet. She participates in exercise intermittently. Her home blood glucose trend is fluctuating minimally. Her breakfast blood glucose is taken between 8-9 am. Her breakfast blood glucose range is generally 110-130 mg/dl. An ACE inhibitor/angiotensin II receptor blocker is being taken. She does not see a podiatrist.Eye exam is current.  Hypertension This is a chronic problem. The current episode started more than 1 year ago. The problem is uncontrolled. Pertinent negatives include no blurred vision. Past treatments include angiotensin blockers and diuretics. The current treatment provides moderate improvement. Compliance problems include exercise.  Hypertensive end-organ damage includes kidney disease.     Past Medical History:  Diagnosis Date  . Diabetes ([place])   . High cholesterol   . Hypertension   . Polymyalgia rheumatica (HCC)      Family History  Problem Relation Age of  Onset  . Healthy Mother   . Heart attack Father      Current Outpatient Medications:  .  ACCU-CHEK FASTCLIX LANCETS MISC, by Does not apply route., Disp: , Rfl:  .  acetaminophen (TYLENOL) 500 MG tablet, Take 1,000 mg by mouth every 6 (six) hours as needed for moderate pain., Disp: , Rfl:  .  aspirin EC 81 MG tablet, Take 81 mg by mouth daily., Disp: , Rfl:  .  Blood Glucose Monitoring Suppl (ACCU-CHEK NANO SMARTVIEW) w/Device KIT, 1 strip by Does not apply route., Disp: , Rfl:  .  Cholecalciferol (VITAMIN D) 2000 units CAPS, Take by mouth., Disp: , Rfl:  .  Fish Oil OIL, Take 1 capsule by mouth daily., Disp: , Rfl:  .  fluticasone (FLONASE) 50 MCG/ACT nasal spray, Place 1 spray into both nostrils daily as needed for allergies or rhinitis., Disp: , Rfl:  .  hydrochlorothiazide (HYDRODIURIL) 25 MG tablet, TAKE ONE TABLET BY MOUTH ONE TIME DAILY , Disp: 90 tablet, Rfl: 0 .  metoprolol tartrate (LOPRESSOR) 50 MG tablet, TAKE ONE TABLET BY MOUTH TWICE DAILY , Disp: 180 tablet, Rfl: 0 .  olmesartan (BENICAR) 40 MG tablet, TAKE ONE TABLET BY MOUTH ONE TIME DAILY , Disp: 90 tablet, Rfl: 0 .  pravastatin (PRAVACHOL) 40 MG tablet, Take 1 tablet (40 mg total) by mouth daily., Disp: 90 tablet, Rfl: 1   Allergies  Allergen Reactions  . Morphine And Related Rash     The patient states she uses post menopausal status for birth control. Last LMP was No LMP recorded. Patient is postmenopausal.. Negative for Dysmenorrhea  Negative for: breast discharge, breast lump(s), breast pain and breast  self exam. Associated symptoms include abnormal vaginal bleeding. Pertinent negatives include abnormal bleeding (hematology), anxiety, decreased libido, depression, difficulty falling sleep, dyspareunia, history of infertility, nocturia, sexual dysfunction, sleep disturbances, urinary incontinence, urinary urgency, vaginal discharge and vaginal itching. Diet regular.The patient states her exercise level is    . The  patient's tobacco use is:  Social History   Tobacco Use  Smoking Status Never Smoker  Smokeless Tobacco Never Used  . She has been exposed to passive smoke. The patient's alcohol use is:  Social History   Substance and Sexual Activity  Alcohol Use No   Review of Systems  Constitutional: Negative.   HENT: Negative.   Eyes: Negative.  Negative for blurred vision.  Respiratory: Negative.   Cardiovascular: Negative.   Endocrine: Negative.   Genitourinary: Negative.   Musculoskeletal: Negative.   Skin: Negative.   Allergic/Immunologic: Negative.   Neurological: Negative.   Hematological: Negative.   Psychiatric/Behavioral: Negative.      Today's Vitals   04/17/19 1104  BP: 140/88  Pulse: 84  Temp: 97.9 F (36.6 C)  TempSrc: Oral  Weight: 138 lb 6.4 oz (62.8 kg)  Height: 5' 0.6" (1.539 m)  PainSc: 0-No pain   Body mass index is 26.5 kg/m.   Objective:  Physical Exam Vitals and nursing note reviewed.  Constitutional:      Appearance: Normal appearance.  HENT:     Head: Normocephalic and atraumatic.     Right Ear: Tympanic membrane, ear canal and external ear normal.     Left Ear: Tympanic membrane, ear canal and external ear normal.     Nose:     Comments: Deferred, masked    Mouth/Throat:     Comments: Deferred, masked Eyes:     Extraocular Movements: Extraocular movements intact.     Conjunctiva/sclera: Conjunctivae normal.     Pupils: Pupils are equal, round, and reactive to light.  Cardiovascular:     Rate and Rhythm: Normal rate and regular rhythm.     Pulses: Normal pulses.          Dorsalis pedis pulses are 2+ on the right side and 2+ on the left side.     Heart sounds: Normal heart sounds.  Pulmonary:     Effort: Pulmonary effort is normal.     Breath sounds: Normal breath sounds.  Chest:     Breasts: Tanner Score is 5.        Right: Normal.        Left: Normal.  Abdominal:     General: Abdomen is flat. Bowel sounds are normal.     Palpations:  Abdomen is soft.  Genitourinary:    Comments: deferred Musculoskeletal:        General: Normal range of motion.     Cervical back: Normal range of motion and neck supple.  Feet:     Right foot:     Protective Sensation: 5 sites tested. 5 sites sensed.     Skin integrity: Skin integrity normal.     Toenail Condition: Right toenails are normal.     Left foot:     Protective Sensation: 5 sites tested. 5 sites sensed.     Skin integrity: Skin integrity normal.     Toenail Condition: Left toenails are normal.  Skin:    General: Skin is warm and dry.  Neurological:     General: No focal deficit present.     Mental Status: She is alert and oriented to person, place, and time.  Psychiatric:  Mood and Affect: Mood normal.        Behavior: Behavior normal.         Assessment And Plan:     1. Routine general medical examination at health care facility  A full exam was performed.  Importance of monthly self breast exams was discussed with the patient. PATIENT HAS BEEN ADVISED TO GET 30-45 MINUTES REGULAR EXERCISE NO LESS THAN FOUR TO FIVE DAYS PER WEEK - BOTH WEIGHTBEARING EXERCISES AND AEROBIC ARE RECOMMENDED.  HE/SHE WAS ADVISED TO FOLLOW A HEALTHY DIET WITH AT LEAST SIX FRUITS/VEGGIES PER DAY, DECREASE INTAKE OF RED MEAT, AND TO INCREASE FISH INTAKE TO TWO DAYS PER WEEK.  MEATS/FISH SHOULD NOT BE FRIED, BAKED OR BROILED IS PREFERABLE.  I SUGGEST WEARING SPF 50 SUNSCREEN ON EXPOSED PARTS AND ESPECIALLY WHEN IN THE DIRECT SUNLIGHT FOR AN EXTENDED PERIOD OF TIME.  PLEASE AVOID FAST FOOD RESTAURANTS AND INCREASE YOUR WATER INTAKE.  2. Type 2 diabetes mellitus with stage 3 chronic kidney disease, without long-term current use of insulin, unspecified whether stage 3a or 3b CKD ([place])  Diabetic foot exam was performed. I DISCUSSED WITH THE PATIENT AT LENGTH REGARDING THE GOALS OF GLYCEMIC CONTROL AND POSSIBLE LONG-TERM COMPLICATIONS.  I  ALSO STRESSED THE IMPORTANCE OF COMPLIANCE WITH HOME  GLUCOSE MONITORING, DIETARY RESTRICTIONS INCLUDING AVOIDANCE OF SUGARY DRINKS/PROCESSED FOODS,  ALONG WITH REGULAR EXERCISE.  I  ALSO STRESSED THE IMPORTANCE OF ANNUAL EYE EXAMS, SELF FOOT CARE AND COMPLIANCE WITH OFFICE VISITS.   3. Hypertensive nephropathy  Chronic, fair control. She will continue with current meds for now. She is encouraged to avoid adding salt to her foods. EKG performed, no new changes noted. She will rto in 3-4 months for re-evaluation.   - POCT Urinalysis Dipstick (81002) - POCT UA - Microalbumin - EKG 12-Lead    4. Polymyalgia rheumatica (HCC)  Chronic, yet stable. She is no longer followed by Rheumatology.   - CK, total  5. Overweight with body mass index (BMI) of 26 to 26.9 in adult  She is encouraged to incorporate more exercise into her daily routine. She is advised to aim for at least 30 minutes five days per week, also reminded that she can incorporate chair exercises as well.    Maximino Greenland, MD    THE PATIENT IS ENCOURAGED TO PRACTICE SOCIAL DISTANCING DUE TO THE COVID-19 PANDEMIC.

## 2019-04-17 NOTE — Patient Instructions (Signed)
Health Maintenance, Female Adopting a healthy lifestyle and getting preventive care are important in promoting health and wellness. Ask your health care provider about:  The right schedule for you to have regular tests and exams.  Things you can do on your own to prevent diseases and keep yourself healthy. What should I know about diet, weight, and exercise? Eat a healthy diet   Eat a diet that includes plenty of vegetables, fruits, low-fat dairy products, and lean protein.  Do not eat a lot of foods that are high in solid fats, added sugars, or sodium. Maintain a healthy weight Body mass index (BMI) is used to identify weight problems. It estimates body fat based on height and weight. Your health care provider can help determine your BMI and help you achieve or maintain a healthy weight. Get regular exercise Get regular exercise. This is one of the most important things you can do for your health. Most adults should:  Exercise for at least 150 minutes each week. The exercise should increase your heart rate and make you sweat (moderate-intensity exercise).  Do strengthening exercises at least twice a week. This is in addition to the moderate-intensity exercise.  Spend less time sitting. Even light physical activity can be beneficial. Watch cholesterol and blood lipids Have your blood tested for lipids and cholesterol at 83 years of age, then have this test every 5 years. Have your cholesterol levels checked more often if:  Your lipid or cholesterol levels are high.  You are older than 83 years of age.  You are at high risk for heart disease. What should I know about cancer screening? Depending on your health history and family history, you may need to have cancer screening at various ages. This may include screening for:  Breast cancer.  Cervical cancer.  Colorectal cancer.  Skin cancer.  Lung cancer. What should I know about heart disease, diabetes, and high blood  pressure? Blood pressure and heart disease  High blood pressure causes heart disease and increases the risk of stroke. This is more likely to develop in people who have high blood pressure readings, are of African descent, or are overweight.  Have your blood pressure checked: ? Every 3-5 years if you are 18-39 years of age. ? Every year if you are 40 years old or older. Diabetes Have regular diabetes screenings. This checks your fasting blood sugar level. Have the screening done:  Once every three years after age 40 if you are at a normal weight and have a low risk for diabetes.  More often and at a younger age if you are overweight or have a high risk for diabetes. What should I know about preventing infection? Hepatitis B If you have a higher risk for hepatitis B, you should be screened for this virus. Talk with your health care provider to find out if you are at risk for hepatitis B infection. Hepatitis C Testing is recommended for:  Everyone born from 1945 through 1965.  Anyone with known risk factors for hepatitis C. Sexually transmitted infections (STIs)  Get screened for STIs, including gonorrhea and chlamydia, if: ? You are sexually active and are younger than 83 years of age. ? You are older than 83 years of age and your health care provider tells you that you are at risk for this type of infection. ? Your sexual activity has changed since you were last screened, and you are at increased risk for chlamydia or gonorrhea. Ask your health care provider if   you are at risk.  Ask your health care provider about whether you are at high risk for HIV. Your health care provider may recommend a prescription medicine to help prevent HIV infection. If you choose to take medicine to prevent HIV, you should first get tested for HIV. You should then be tested every 3 months for as long as you are taking the medicine. Pregnancy  If you are about to stop having your period (premenopausal) and  you may become pregnant, seek counseling before you get pregnant.  Take 400 to 800 micrograms (mcg) of folic acid every day if you become pregnant.  Ask for birth control (contraception) if you want to prevent pregnancy. Osteoporosis and menopause Osteoporosis is a disease in which the bones lose minerals and strength with aging. This can result in bone fractures. If you are 65 years old or older, or if you are at risk for osteoporosis and fractures, ask your health care provider if you should:  Be screened for bone loss.  Take a calcium or vitamin D supplement to lower your risk of fractures.  Be given hormone replacement therapy (HRT) to treat symptoms of menopause. Follow these instructions at home: Lifestyle  Do not use any products that contain nicotine or tobacco, such as cigarettes, e-cigarettes, and chewing tobacco. If you need help quitting, ask your health care provider.  Do not use street drugs.  Do not share needles.  Ask your health care provider for help if you need support or information about quitting drugs. Alcohol use  Do not drink alcohol if: ? Your health care provider tells you not to drink. ? You are pregnant, may be pregnant, or are planning to become pregnant.  If you drink alcohol: ? Limit how much you use to 0-1 drink a day. ? Limit intake if you are breastfeeding.  Be aware of how much alcohol is in your drink. In the [place] one drink equals one 12 oz bottle of beer (355 mL), one 5 oz glass of wine (148 mL), or one 1 oz glass of hard liquor (44 mL). General instructions  Schedule regular health, dental, and eye exams.  Stay current with your vaccines.  Tell your health care provider if: ? You often feel depressed. ? You have ever been abused or do not feel safe at home. Summary  Adopting a healthy lifestyle and getting preventive care are important in promoting health and wellness.  Follow your health care provider's instructions about healthy  diet, exercising, and getting tested or screened for diseases.  Follow your health care provider's instructions on monitoring your cholesterol and blood pressure. This information is not intended to replace advice given to you by your health care provider. Make sure you discuss any questions you have with your health care provider. Document Released: 10/25/2010 Document Revised: 04/04/2018 Document Reviewed: 04/04/2018 Elsevier Patient Education  2020 [employer]  

## 2019-04-18 LAB — CMP14+EGFR
ALT: 18 IU/L (ref 0–32)
AST: 31 IU/L (ref 0–40)
Albumin/Globulin Ratio: 1.2 (ref 1.2–2.2)
Albumin: 4.4 g/dL (ref 3.6–4.6)
Alkaline Phosphatase: 69 IU/L (ref 39–117)
BUN/Creatinine Ratio: 14 (ref 12–28)
BUN: 15 mg/dL (ref 8–27)
Bilirubin Total: 0.5 mg/dL (ref 0.0–1.2)
CO2: 24 mmol/L (ref 20–29)
Calcium: 10.1 mg/dL (ref 8.7–10.3)
Chloride: 102 mmol/L (ref 96–106)
Creatinine, Ser: 1.09 mg/dL — ABNORMAL HIGH (ref 0.57–1.00)
GFR calc Af Amer: 53 mL/min/{1.73_m2} — ABNORMAL LOW (ref >59)
GFR calc non Af Amer: 46 mL/min/{1.73_m2} — ABNORMAL LOW (ref >59)
Globulin, Total: 3.8 g/dL (ref 1.5–4.5)
Glucose: 109 mg/dL — ABNORMAL HIGH (ref 65–99)
Potassium: 3.9 mmol/L (ref 3.5–5.2)
Sodium: 144 mmol/L (ref 134–144)
Total Protein: 8.2 g/dL (ref 6.0–8.5)

## 2019-04-18 LAB — CBC
Hematocrit: 35 % (ref 34.0–46.6)
Hemoglobin: 11.7 g/dL (ref 11.1–15.9)
MCH: 30.2 pg (ref 26.6–33.0)
MCHC: 33.4 g/dL (ref 31.5–35.7)
MCV: 90 fL (ref 79–97)
Platelets: 323 10*3/uL (ref 150–450)
RBC: 3.87 x10E6/uL (ref 3.77–5.28)
RDW: 11.9 % (ref 11.7–15.4)
WBC: 5.6 10*3/uL (ref 3.4–10.8)

## 2019-04-18 LAB — CK: Total CK: 268 U/L — ABNORMAL HIGH (ref 26–161)

## 2019-04-18 LAB — LIPID PANEL
Chol/HDL Ratio: 4.3 ratio (ref 0.0–4.4)
Cholesterol, Total: 180 mg/dL (ref 100–199)
HDL: 42 mg/dL (ref >39)
LDL Chol Calc (NIH): 115 mg/dL — ABNORMAL HIGH (ref 0–99)
Triglycerides: 127 mg/dL (ref 0–149)
VLDL Cholesterol Cal: 23 mg/dL (ref 5–40)

## 2019-04-18 LAB — HEMOGLOBIN A1C
Est. average glucose Bld gHb Est-mCnc: 140 mg/dL
Hgb A1c MFr Bld: 6.5 % — ABNORMAL HIGH (ref 4.8–5.6)

## 2019-04-24 ENCOUNTER — Ambulatory Visit: Payer: Medicare Other | Admitting: Internal Medicine

## 2019-04-24 ENCOUNTER — Ambulatory Visit: Payer: Medicare Other

## 2019-05-21 ENCOUNTER — Other Ambulatory Visit: Payer: Self-pay | Admitting: Internal Medicine

## 2019-07-14 ENCOUNTER — Other Ambulatory Visit: Payer: Self-pay | Admitting: Internal Medicine

## 2019-08-11 ENCOUNTER — Other Ambulatory Visit: Payer: Self-pay | Admitting: Internal Medicine

## 2019-08-19 ENCOUNTER — Encounter: Payer: Self-pay | Admitting: Internal Medicine

## 2019-08-19 ENCOUNTER — Other Ambulatory Visit: Payer: Self-pay

## 2019-08-19 ENCOUNTER — Ambulatory Visit: Payer: Medicare Other | Admitting: Internal Medicine

## 2019-08-19 VITALS — BP 128/82 | HR 75 | Temp 97.8°F | Ht 61.6 in | Wt 135.2 lb

## 2019-08-19 DIAGNOSIS — M353 Polymyalgia rheumatica: Secondary | ICD-10-CM | POA: Diagnosis not present

## 2019-08-19 DIAGNOSIS — I129 Hypertensive chronic kidney disease with stage 1 through stage 4 chronic kidney disease, or unspecified chronic kidney disease: Secondary | ICD-10-CM

## 2019-08-19 DIAGNOSIS — E1122 Type 2 diabetes mellitus with diabetic chronic kidney disease: Secondary | ICD-10-CM

## 2019-08-19 DIAGNOSIS — D519 Vitamin B12 deficiency anemia, unspecified: Secondary | ICD-10-CM | POA: Diagnosis not present

## 2019-08-19 DIAGNOSIS — R413 Other amnesia: Secondary | ICD-10-CM

## 2019-08-19 DIAGNOSIS — N183 Chronic kidney disease, stage 3 unspecified: Secondary | ICD-10-CM | POA: Diagnosis not present

## 2019-08-19 DIAGNOSIS — Z23 Encounter for immunization: Secondary | ICD-10-CM | POA: Diagnosis not present

## 2019-08-19 DIAGNOSIS — E7800 Pure hypercholesterolemia, unspecified: Secondary | ICD-10-CM | POA: Diagnosis not present

## 2019-08-19 DIAGNOSIS — Z6825 Body mass index (BMI) 25.0-25.9, adult: Secondary | ICD-10-CM

## 2019-08-19 NOTE — Patient Instructions (Signed)
Increase pravastatin 40mg  to TWO tablets daily M-F, skip Sat and Sun    High Cholesterol  High cholesterol is a condition in which the blood has high levels of a white, waxy, fat-like substance (cholesterol). The human body needs small amounts of cholesterol. The liver makes all the cholesterol that the body needs. Extra (excess) cholesterol comes from the food that we eat. Cholesterol is carried from the liver by the blood through the blood vessels. If you have high cholesterol, deposits (plaques) may build up on the walls of your blood vessels (arteries). Plaques make the arteries narrower and stiffer. Cholesterol plaques increase your risk for heart attack and stroke. Work with your health care provider to keep your cholesterol levels in a healthy range. What increases the risk? This condition is more likely to develop in people who:  Eat foods that are high in animal fat (saturated fat) or cholesterol.  Are overweight.  Are not getting enough exercise.  Have a family history of high cholesterol. What are the signs or symptoms? There are no symptoms of this condition. How is this diagnosed? This condition may be diagnosed from the results of a blood test.  If you are older than age 54, your health care provider may check your cholesterol every 4-6 years.  You may be checked more often if you already have high cholesterol or other risk factors for heart disease. The blood test for cholesterol measures:  "Bad" cholesterol (LDL cholesterol). This is the main type of cholesterol that causes heart disease. The desired level for LDL is less than 100.  "Good" cholesterol (HDL cholesterol). This type helps to protect against heart disease by cleaning the arteries and carrying the LDL away. The desired level for HDL is 60 or higher.  Triglycerides. These are fats that the body can store or burn for energy. The desired number for triglycerides is lower than 150.  Total cholesterol. This is  a measure of the total amount of cholesterol in your blood, including LDL cholesterol, HDL cholesterol, and triglycerides. A healthy number is less than 200. How is this treated? This condition is treated with diet changes, lifestyle changes, and medicines. Diet changes  This may include eating more whole grains, fruits, vegetables, nuts, and fish.  This may also include cutting back on red meat and foods that have a lot of added sugar. Lifestyle changes  Changes may include getting at least 40 minutes of aerobic exercise 3 times a week. Aerobic exercises include walking, biking, and swimming. Aerobic exercise along with a healthy diet can help you maintain a healthy weight.  Changes may also include quitting smoking. Medicines  Medicines are usually given if diet and lifestyle changes have failed to reduce your cholesterol to healthy levels.  Your health care provider may prescribe a statin medicine. Statin medicines have been shown to reduce cholesterol, which can reduce the risk of heart disease. Follow these instructions at home: Eating and drinking If told by your health care provider:  Eat chicken (without skin), fish, veal, shellfish, ground 26 breast, and round or loin cuts of red meat.  Do not eat fried foods or fatty meats, such as hot dogs and salami.  Eat plenty of fruits, such as apples.  Eat plenty of vegetables, such as broccoli, potatoes, and carrots.  Eat beans, peas, and lentils.  Eat grains such as barley, rice, couscous, and bulgur wheat.  Eat pasta without cream sauces.  Use skim or nonfat milk, and eat low-fat or nonfat yogurt  and cheeses.  Do not eat or drink whole milk, cream, ice cream, egg yolks, or hard cheeses.  Do not eat stick margarine or tub margarines that contain trans fats (also called partially hydrogenated oils).  Do not eat saturated tropical oils, such as coconut oil and palm oil.  Do not eat cakes, cookies, crackers, or other  baked goods that contain trans fats.  General instructions  Exercise as directed by your health care provider. Increase your activity level with activities such as gardening, walking, and taking the stairs.  Take over-the-counter and prescription medicines only as told by your health care provider.  Do not use any products that contain nicotine or tobacco, such as cigarettes and e-cigarettes. If you need help quitting, ask your health care provider.  Keep all follow-up visits as told by your health care provider. This is important. Contact a health care provider if:  You are struggling to maintain a healthy diet or weight.  You need help to start on an exercise program.  You need help to stop smoking. Get help right away if:  You have chest pain.  You have trouble breathing. This information is not intended to replace advice given to you by your health care provider. Make sure you discuss any questions you have with your health care provider. Document Revised: 04/14/2017 Document Reviewed: 10/10/2015 Elsevier Patient Education  [place].

## 2019-08-19 NOTE — Progress Notes (Signed)
This visit occurred during the SARS-CoV-2 public health emergency.  Safety protocols were in place, including screening questions prior to the visit, additional usage of staff PPE, and extensive cleaning of exam room while observing appropriate contact time as indicated for disinfecting solutions.  Subjective:     Patient ID: Rebekah Avila , female    DOB: [birth year] , 86 y.o.   MRN: [id number]   Chief Complaint  Patient presents with  . Diabetes  . Hypertension    HPI  She presents today for diabetes check. She reports compliance with meds.   Diabetes She presents for her follow-up diabetic visit. She has type 2 diabetes mellitus. Her disease course has been stable. There are no hypoglycemic associated symptoms. Speech difficulty:  Pertinent negatives for diabetes include no blurred vision and no chest pain. There are no hypoglycemic complications. Risk factors for coronary artery disease include diabetes mellitus, dyslipidemia, hypertension, post-menopausal and sedentary lifestyle. She is following a diabetic diet. She participates in exercise intermittently. Her home blood glucose trend is fluctuating minimally. Her breakfast blood glucose is taken between 8-9 am. Her breakfast blood glucose range is generally 110-130 mg/dl. An ACE inhibitor/angiotensin II receptor blocker is being taken.  Hypertension This is a chronic problem. The current episode started more than 1 year ago. The problem is uncontrolled. Pertinent negatives include no blurred vision or chest pain. The current treatment provides moderate improvement. Compliance problems include exercise.  Hypertensive end-organ damage includes kidney disease.     Past Medical History:  Diagnosis Date  . Diabetes ([place])   . High cholesterol   . Hypertension   . Polymyalgia rheumatica (HCC)      Family History  Problem Relation Age of Onset  . Healthy Mother   . Heart attack Father      Current Outpatient Medications:  .   ACCU-CHEK FASTCLIX LANCETS MISC, by Does not apply route., Disp: , Rfl:  .  acetaminophen (TYLENOL) 500 MG tablet, Take 1,000 mg by mouth every 6 (six) hours as needed for moderate pain., Disp: , Rfl:  .  aspirin EC 81 MG tablet, Take 81 mg by mouth daily., Disp: , Rfl:  .  Blood Glucose Monitoring Suppl (ACCU-CHEK NANO SMARTVIEW) w/Device KIT, 1 strip by Does not apply route., Disp: , Rfl:  .  Cholecalciferol (VITAMIN D) 2000 units CAPS, Take by mouth., Disp: , Rfl:  .  Fish Oil OIL, Take 1 capsule by mouth daily., Disp: , Rfl:  .  fluticasone (FLONASE) 50 MCG/ACT nasal spray, Place 1 spray into both nostrils daily as needed for allergies or rhinitis., Disp: , Rfl:  .  hydrochlorothiazide (HYDRODIURIL) 25 MG tablet, TAKE ONE TABLET BY MOUTH ONE TIME DAILY , Disp: 90 tablet, Rfl: 0 .  metoprolol tartrate (LOPRESSOR) 50 MG tablet, TAKE ONE TABLET BY MOUTH TWICE DAILY , Disp: 180 tablet, Rfl: 0 .  olmesartan (BENICAR) 40 MG tablet, TAKE ONE TABLET BY MOUTH ONE TIME DAILY , Disp: 90 tablet, Rfl: 0 .  pravastatin (PRAVACHOL) 40 MG tablet, TAKE ONE TABLET BY MOUTH ONE TIME DAILY , Disp: 90 tablet, Rfl: 0   Allergies  Allergen Reactions  . Morphine And Related Rash     Review of Systems  Constitutional: Negative.   Eyes: Negative for blurred vision.  Respiratory: Negative.   Cardiovascular: Negative.  Negative for chest pain.  Gastrointestinal: Negative.   Neurological: Negative.  Speech difficulty:        She c/o memory issues. "Nothing serious, no different than  my friends".  Psychiatric/Behavioral: Negative.      Today's Vitals   08/19/19 1042  BP: 128/82  Pulse: 75  Temp: 97.8 F (36.6 C)  Weight: 135 lb 3.2 oz (61.3 kg)  Height: 5' 1.6" (1.565 m)   Body mass index is 25.05 kg/m.   Objective:  Physical Exam Vitals and nursing note reviewed.  Constitutional:      Appearance: Normal appearance.  HENT:     Head: Normocephalic and atraumatic.  Cardiovascular:     Rate and  Rhythm: Normal rate and regular rhythm.     Heart sounds: Normal heart sounds.  Pulmonary:     Effort: Pulmonary effort is normal.     Breath sounds: Normal breath sounds.  Skin:    General: Skin is warm.  Neurological:     General: No focal deficit present.     Mental Status: She is alert.  Psychiatric:        Mood and Affect: Mood normal.        Behavior: Behavior normal.         Assessment And Plan:     1. Type 2 diabetes mellitus with stage 3 chronic kidney disease, without long-term current use of insulin, unspecified whether stage 3a or 3b CKD (HCC)  Chronic, I will check labs as listed below.  Importance of dietary and medication compliance was discussed with the patient. She will rto in 4 months for re-evaluation.   - Hemoglobin A1c - BMP8+EGFR  2. Hypertensive nephropathy  Chronic, well controlled. She will continue with current meds. She is encouraged to avoid adding salt to her foods.   3. Pure hypercholesterolemia  Chronic, I reviewed most recent LDL, it is greater than 100. Pt advised that LDL goal for those with diabetes is less than 70.  I will increase her pravastatin to 2 tabs M-F and she can skip doses on Saturdays/Sundays. She agrees to rto in six weeks for re-evaluation.    4. Polymyalgia rheumatica (HCC)  Chronic, yet stable. I will check CPK today.   - CK, total  5. Memory loss  I will check labs as listed below. I will make further recommendations once her labs are available for review.   - Vitamin B12 - TSH  6. Body mass index (BMI) of 25.0-25.9 in adult  Her weight is stable for her demographic. Encouraged to still exercise 20-30 minutes five days weekly.   7.  Immunization due  She was given pneumovax-23 to update her immunization history.  Maximino Greenland, MD    THE PATIENT IS ENCOURAGED TO PRACTICE SOCIAL DISTANCING DUE TO THE COVID-19 PANDEMIC.

## 2019-08-20 LAB — BMP8+EGFR
BUN/Creatinine Ratio: 13 (ref 12–28)
BUN: 13 mg/dL (ref 8–27)
CO2: 25 mmol/L (ref 20–29)
Calcium: 9.9 mg/dL (ref 8.7–10.3)
Chloride: 101 mmol/L (ref 96–106)
Creatinine, Ser: 1.04 mg/dL — ABNORMAL HIGH (ref 0.57–1.00)
GFR calc Af Amer: 57 mL/min/{1.73_m2} — ABNORMAL LOW (ref >59)
GFR calc non Af Amer: 49 mL/min/{1.73_m2} — ABNORMAL LOW (ref >59)
Glucose: 116 mg/dL — ABNORMAL HIGH (ref 65–99)
Potassium: 3.9 mmol/L (ref 3.5–5.2)
Sodium: 141 mmol/L (ref 134–144)

## 2019-08-20 LAB — HEMOGLOBIN A1C
Est. average glucose Bld gHb Est-mCnc: 143 mg/dL
Hgb A1c MFr Bld: 6.6 % — ABNORMAL HIGH (ref 4.8–5.6)

## 2019-08-20 LAB — CK: Total CK: 178 U/L — ABNORMAL HIGH (ref 26–161)

## 2019-08-20 LAB — TSH: TSH: 2.36 u[IU]/mL (ref 0.450–4.500)

## 2019-08-20 LAB — VITAMIN B12: Vitamin B-12: 246 pg/mL (ref 232–1245)

## 2019-08-26 ENCOUNTER — Telehealth: Payer: Self-pay

## 2019-08-26 NOTE — Telephone Encounter (Signed)
-----   Message from Dorothyann Peng, MD sent at 08/24/2019  4:43 PM EDT ----- Here are your lab results:  Your hba1c is 6.6, this is up from last visit.   Your kidney function is stable. Be sure to stay well hydrated.    Your muscle enzymes are elevated, but improved from last visit.   Your vitamin B12 level is low end of normal. TB/SWN-pls add on methylmalonic acid. Dx: D51.9.   Your thyroid function is normal.   Please let me know if you have any questions or concerns. Stay safe!   Sincerely,    Robyn N. Allyne Gee, MD

## 2019-08-26 NOTE — Telephone Encounter (Signed)
Left vm. Attempt to give lab results  

## 2019-08-30 LAB — SPECIMEN STATUS REPORT

## 2019-08-30 LAB — METHYLMALONIC ACID, SERUM: Methylmalonic Acid: 530 nmol/L — ABNORMAL HIGH (ref 0–378)

## 2019-09-02 ENCOUNTER — Telehealth: Payer: Self-pay

## 2019-09-02 NOTE — Telephone Encounter (Signed)
-----   Message from Dorothyann Peng, MD sent at 09/01/2019 10:34 PM EDT ----- She needs vitamin B12 injections - this will help with memory and balance. She will need weekly injections (first one with me) x 4 weeks, then once monthly x 3. Pls schedule her by Wed of this week. Thanks

## 2019-09-04 ENCOUNTER — Encounter: Payer: Self-pay | Admitting: Internal Medicine

## 2019-09-04 ENCOUNTER — Ambulatory Visit: Payer: Medicare Other | Admitting: Internal Medicine

## 2019-09-04 ENCOUNTER — Other Ambulatory Visit: Payer: Self-pay

## 2019-09-04 VITALS — BP 124/72 | HR 78 | Temp 98.3°F | Ht 61.0 in | Wt 134.6 lb

## 2019-09-04 DIAGNOSIS — E538 Deficiency of other specified B group vitamins: Secondary | ICD-10-CM

## 2019-09-04 DIAGNOSIS — Z23 Encounter for immunization: Secondary | ICD-10-CM

## 2019-09-04 DIAGNOSIS — E7800 Pure hypercholesterolemia, unspecified: Secondary | ICD-10-CM

## 2019-09-04 MED ORDER — CYANOCOBALAMIN 1000 MCG/ML IJ SOLN
1000.0000 ug | Freq: Once | INTRAMUSCULAR | Status: AC
  Administered 2019-09-04: 1000 ug via INTRAMUSCULAR

## 2019-09-04 NOTE — Patient Instructions (Addendum)
Take 2 chol meds (pravastatin) on M-F and SKIP Saturday/Sunday   Vitamin B12 Deficiency Vitamin B12 deficiency means that your body does not have enough vitamin B12. The body needs this vitamin:  To make red blood cells.  To make genes (DNA).  To help the nerves work. If you do not have enough vitamin B12 in your body, you can have health problems. What are the causes?  Not eating enough foods that contain vitamin B12.  Not being able to absorb vitamin B12 from the food that you eat.  Certain digestive system diseases.  A condition in which the body does not make enough of a certain protein, which results in too few red blood cells (pernicious anemia).  Having a surgery in which part of the stomach or small intestine is removed.  Taking medicines that make it hard for the body to absorb vitamin B12. These medicines include: ? Heartburn medicines. ? Some antibiotic medicines. ? Other medicines that are used to treat certain conditions. What increases the risk?  Being older than age 1.  Eating a vegetarian or vegan diet, especially while you are pregnant.  Eating a poor diet while you are pregnant.  Taking certain medicines.  Having alcoholism. What are the signs or symptoms? In some cases, there are no symptoms. If the condition leads to too few blood cells or nerve damage, symptoms can occur, such as:  Feeling weak.  Feeling tired (fatigued).  Not being hungry.  Weight loss.  A loss of feeling (numbness) or tingling in your hands and feet.  Redness and burning of the tongue.  Being mixed up (confused) or having memory problems.  Sadness (depression).  Problems with your senses. This can include color blindness, ringing in the ears, or loss of taste.  Watery poop (diarrhea) or trouble pooping (constipation).  Trouble walking. If anemia is very bad, symptoms can include:  Being short of breath.  Being dizzy.  Having a very fast heartbeat. How is  this treated?  Changing the way you eat and drink, such as: ? Eating more foods that contain vitamin B12. ? Drinking little or no alcohol.  Getting vitamin B12 shots.  Taking vitamin B12 supplements. Your doctor will tell you the dose that is best for you. Follow these instructions at home: Eating and drinking   Eat lots of healthy foods that contain vitamin B12. These include: ? Meats and poultry, such as beef, pork, chicken, [place], and organ meats, such as liver. ? Seafood, such as clams, rainbow trout, salmon, tuna, and haddock. ? Eggs. ? Cereal and dairy products that have vitamin B12 added to them. Check the label. The items listed above may not be a complete list of what you can eat and drink. Contact a dietitian for more options. General instructions  Get any shots as told by your doctor.  Take supplements only as told by your doctor.  Do not drink alcohol if your doctor tells you not to. In some cases, you may only be asked to limit alcohol use.  Keep all follow-up visits as told by your doctor. This is important. Contact a doctor if:  Your symptoms come back. Get help right away if:  You have trouble breathing.  You have a very fast heartbeat.  You have chest pain.  You get dizzy.  You pass out. Summary  Vitamin B12 deficiency means that your body is not getting enough vitamin B12.  In some cases, there are no symptoms of this condition.  Treatment  may include making a change in the way you eat and drink, getting vitamin B12 shots, or taking supplements.  Eat lots of healthy foods that contain vitamin B12. This information is not intended to replace advice given to you by your health care provider. Make sure you discuss any questions you have with your health care provider. Document Revised: 12/19/2017 Document Reviewed: 12/19/2017 Elsevier Patient Education  2020 [employer].

## 2019-09-09 MED ORDER — TETANUS-DIPHTH-ACELL PERTUSSIS 5-2.5-18.5 LF-MCG/0.5 IM SUSP: 0.5000 mL | Freq: Once | INTRAMUSCULAR | 0 refills | Status: AC

## 2019-09-09 NOTE — Progress Notes (Signed)
This visit occurred during the SARS-CoV-2 public health emergency.  Safety protocols were in place, including screening questions prior to the visit, additional usage of staff PPE, and extensive cleaning of exam room while observing appropriate contact time as indicated for disinfecting solutions.  Subjective:     Patient ID: Rebekah Avila , female    DOB: [birth year] , 86 y.o.   MRN: [id number]   Chief Complaint  Patient presents with  . B12 Injection    Need TDap    HPI  She is here today for weekly vitamin B12 injection. She has not had any issues since her last injection.     Past Medical History:  Diagnosis Date  . Diabetes ([place])   . High cholesterol   . Hypertension   . Polymyalgia rheumatica (HCC)      Family History  Problem Relation Age of Onset  . Healthy Mother   . Heart attack Father      Current Outpatient Medications:  .  ACCU-CHEK FASTCLIX LANCETS MISC, by Does not apply route., Disp: , Rfl:  .  acetaminophen (TYLENOL) 500 MG tablet, Take 1,000 mg by mouth every 6 (six) hours as needed for moderate pain., Disp: , Rfl:  .  aspirin EC 81 MG tablet, Take 81 mg by mouth daily., Disp: , Rfl:  .  Blood Glucose Monitoring Suppl (ACCU-CHEK NANO SMARTVIEW) w/Device KIT, 1 strip by Does not apply route., Disp: , Rfl:  .  Cholecalciferol (VITAMIN D) 2000 units CAPS, Take by mouth., Disp: , Rfl:  .  Fish Oil OIL, Take 1 capsule by mouth daily., Disp: , Rfl:  .  fluticasone (FLONASE) 50 MCG/ACT nasal spray, Place 1 spray into both nostrils daily as needed for allergies or rhinitis., Disp: , Rfl:  .  hydrochlorothiazide (HYDRODIURIL) 25 MG tablet, TAKE ONE TABLET BY MOUTH ONE TIME DAILY , Disp: 90 tablet, Rfl: 0 .  metoprolol tartrate (LOPRESSOR) 50 MG tablet, TAKE ONE TABLET BY MOUTH TWICE DAILY , Disp: 180 tablet, Rfl: 0 .  olmesartan (BENICAR) 40 MG tablet, TAKE ONE TABLET BY MOUTH ONE TIME DAILY , Disp: 90 tablet, Rfl: 0 .  pravastatin (PRAVACHOL) 40 MG tablet, TAKE  ONE TABLET BY MOUTH ONE TIME DAILY , Disp: 90 tablet, Rfl: 0   Allergies  Allergen Reactions  . Morphine And Related Rash     Review of Systems  Constitutional: Negative.   Respiratory: Negative.   Cardiovascular: Negative.   Gastrointestinal: Negative.   Neurological: Negative.   Psychiatric/Behavioral: Negative.      Today's Vitals   09/04/19 1151  BP: 124/72  Pulse: 78  Temp: 98.3 F (36.8 C)  TempSrc: Oral  Weight: 134 lb 9.6 oz (61.1 kg)  Height: _0  (1.549 m)   Body mass index is 25.43 kg/m.   Objective:  Physical Exam Vitals and nursing note reviewed.  Constitutional:      Appearance: Normal appearance.  HENT:     Head: Normocephalic and atraumatic.  Cardiovascular:     Rate and Rhythm: Normal rate and regular rhythm.     Heart sounds: Normal heart sounds.  Pulmonary:     Effort: Pulmonary effort is normal.     Breath sounds: Normal breath sounds.  Skin:    General: Skin is warm.  Neurological:     General: No focal deficit present.     Mental Status: She is alert.  Psychiatric:        Mood and Affect: Mood normal.  Behavior: Behavior normal.         Assessment And Plan:     1. B12 deficiency  She was given vitamin B12 IM x 1. She will complete weekly B12 x 4. She will rto for the next two weeks as a nurse visit.   - cyanocobalamin ((VITAMIN B-12)) injection 1,000 mcg  2. Pure hypercholesterolemia  Unfortunately, she has yet to increase pravastatin. She is advised to take 2 tabs M-F and skip the weekends. I will recheck at her next visit in four to six weeks.   3. Immunization due  Rx Tdap was sent to the pharmacy.    Maximino Greenland, MD    THE PATIENT IS ENCOURAGED TO PRACTICE SOCIAL DISTANCING DUE TO THE COVID-19 PANDEMIC.

## 2019-09-12 ENCOUNTER — Other Ambulatory Visit: Payer: Self-pay

## 2019-09-12 ENCOUNTER — Encounter: Payer: Self-pay | Admitting: Internal Medicine

## 2019-09-12 ENCOUNTER — Ambulatory Visit: Payer: Medicare Other | Admitting: Internal Medicine

## 2019-09-12 VITALS — BP 134/78 | HR 67 | Temp 98.2°F | Ht 61.4 in | Wt 134.4 lb

## 2019-09-12 DIAGNOSIS — E538 Deficiency of other specified B group vitamins: Secondary | ICD-10-CM

## 2019-09-12 DIAGNOSIS — Z6825 Body mass index (BMI) 25.0-25.9, adult: Secondary | ICD-10-CM | POA: Diagnosis not present

## 2019-09-12 MED ORDER — CYANOCOBALAMIN 1000 MCG/ML IJ SOLN
1000.0000 ug | Freq: Once | INTRAMUSCULAR | Status: AC
  Administered 2019-09-12: 1000 ug via INTRAMUSCULAR

## 2019-09-12 NOTE — Patient Instructions (Signed)

## 2019-09-15 NOTE — Progress Notes (Signed)
This visit occurred during the SARS-CoV-2 public health emergency.  Safety protocols were in place, including screening questions prior to the visit, additional usage of staff PPE, and extensive cleaning of exam room while observing appropriate contact time as indicated for disinfecting solutions.  Subjective:     Patient ID: Rebekah Avila , female    DOB: 1932-01-06 , 86 y.o.   MRN: [id number]   Chief Complaint  Patient presents with  . B12 Injection    HPI  She presents today for her 2nd B12 injection. She reports not feeling any different since last week. She did not have any issues with the injection.     Past Medical History:  Diagnosis Date  . Diabetes ([place])   . High cholesterol   . Hypertension   . Polymyalgia rheumatica (HCC)      Family History  Problem Relation Age of Onset  . Healthy Mother   . Heart attack Father      Current Outpatient Medications:  .  ACCU-CHEK FASTCLIX LANCETS MISC, by Does not apply route., Disp: , Rfl:  .  acetaminophen (TYLENOL) 500 MG tablet, Take 1,000 mg by mouth every 6 (six) hours as needed for moderate pain., Disp: , Rfl:  .  aspirin EC 81 MG tablet, Take 81 mg by mouth daily., Disp: , Rfl:  .  Blood Glucose Monitoring Suppl (ACCU-CHEK NANO SMARTVIEW) w/Device KIT, 1 strip by Does not apply route., Disp: , Rfl:  .  Cholecalciferol (VITAMIN D) 2000 units CAPS, Take by mouth., Disp: , Rfl:  .  Fish Oil OIL, Take 1 capsule by mouth daily., Disp: , Rfl:  .  fluticasone (FLONASE) 50 MCG/ACT nasal spray, Place 1 spray into both nostrils daily as needed for allergies or rhinitis., Disp: , Rfl:  .  hydrochlorothiazide (HYDRODIURIL) 25 MG tablet, TAKE ONE TABLET BY MOUTH ONE TIME DAILY , Disp: 90 tablet, Rfl: 0 .  metoprolol tartrate (LOPRESSOR) 50 MG tablet, TAKE ONE TABLET BY MOUTH TWICE DAILY , Disp: 180 tablet, Rfl: 0 .  olmesartan (BENICAR) 40 MG tablet, TAKE ONE TABLET BY MOUTH ONE TIME DAILY , Disp: 90 tablet, Rfl: 0 .  pravastatin  (PRAVACHOL) 40 MG tablet, TAKE ONE TABLET BY MOUTH ONE TIME DAILY , Disp: 90 tablet, Rfl: 0   Allergies  Allergen Reactions  . Morphine And Related Rash     Review of Systems  Constitutional: Negative.   Respiratory: Negative.   Cardiovascular: Negative.   Gastrointestinal: Negative.   Neurological: Negative.   Psychiatric/Behavioral: Negative.      Today's Vitals   09/12/19 1047  BP: 134/78  Pulse: 67  Temp: 98.2 F (36.8 C)  TempSrc: Oral  Weight: 134 lb 6.4 oz (61 kg)  Height: 5' 1.4" (1.56 m)   Body mass index is 25.06 kg/m.   Objective:  Physical Exam Vitals and nursing note reviewed.  Constitutional:      Appearance: Normal appearance.  HENT:     Head: Normocephalic and atraumatic.  Cardiovascular:     Rate and Rhythm: Normal rate and regular rhythm.     Heart sounds: Normal heart sounds.  Pulmonary:     Effort: Pulmonary effort is normal.     Breath sounds: Normal breath sounds.  Skin:    General: Skin is warm.  Neurological:     General: No focal deficit present.     Mental Status: She is alert.  Psychiatric:        Mood and Affect: Mood normal.  Behavior: Behavior normal.         Assessment And Plan:     1. B12 deficiency  She was given vit B12 iM x1. She will rto weekly for the next weeks, and then monthly.   - cyanocobalamin ((VITAMIN B-12)) injection 1,000 mcg  2. Body mass index (BMI) of 25.0-25.9 in adult Her weight is stable for her her demographic.   Maximino Greenland, MD    THE PATIENT IS ENCOURAGED TO PRACTICE SOCIAL DISTANCING DUE TO THE COVID-19 PANDEMIC.

## 2019-09-17 ENCOUNTER — Ambulatory Visit: Payer: Medicare Other | Admitting: Internal Medicine

## 2019-09-17 ENCOUNTER — Ambulatory Visit: Payer: Medicare Other

## 2019-09-19 ENCOUNTER — Ambulatory Visit (INDEPENDENT_AMBULATORY_CARE_PROVIDER_SITE_OTHER): Payer: Medicare Other

## 2019-09-19 ENCOUNTER — Other Ambulatory Visit: Payer: Self-pay

## 2019-09-19 VITALS — BP 114/60 | HR 62 | Temp 98.5°F | Ht 61.4 in | Wt 135.4 lb

## 2019-09-19 DIAGNOSIS — E538 Deficiency of other specified B group vitamins: Secondary | ICD-10-CM | POA: Diagnosis not present

## 2019-09-19 MED ORDER — CYANOCOBALAMIN 1000 MCG/ML IJ SOLN
1000.0000 ug | Freq: Once | INTRAMUSCULAR | Status: AC
  Administered 2019-09-19: 1000 ug via INTRAMUSCULAR

## 2019-09-19 NOTE — Progress Notes (Signed)
Pt is here today for a vitamin b12 injection. Pt will follow up with provider. This is her 3rd injection.

## 2019-09-24 ENCOUNTER — Ambulatory Visit: Payer: Medicare Other

## 2019-09-26 ENCOUNTER — Other Ambulatory Visit: Payer: Self-pay

## 2019-09-26 ENCOUNTER — Ambulatory Visit (INDEPENDENT_AMBULATORY_CARE_PROVIDER_SITE_OTHER): Payer: Medicare Other

## 2019-09-26 VITALS — BP 138/76 | HR 77 | Temp 98.2°F | Ht 61.4 in | Wt 134.0 lb

## 2019-09-26 DIAGNOSIS — E538 Deficiency of other specified B group vitamins: Secondary | ICD-10-CM

## 2019-09-26 MED ORDER — CYANOCOBALAMIN 1000 MCG/ML IJ SOLN
1000.0000 ug | Freq: Once | INTRAMUSCULAR | Status: AC
  Administered 2019-09-26: 1000 ug via INTRAMUSCULAR

## 2019-09-26 NOTE — Progress Notes (Signed)
Pt is here today for her 4th of 4 b12 injection. Pt will follow up with provider on 10/31/19

## 2019-10-07 ENCOUNTER — Other Ambulatory Visit: Payer: Self-pay | Admitting: Internal Medicine

## 2019-10-23 ENCOUNTER — Ambulatory Visit: Payer: Medicare Other | Admitting: Internal Medicine

## 2019-10-30 ENCOUNTER — Encounter: Payer: Self-pay | Admitting: Internal Medicine

## 2019-10-31 ENCOUNTER — Encounter: Payer: Medicare Other | Admitting: Internal Medicine

## 2019-10-31 ENCOUNTER — Telehealth: Payer: Self-pay

## 2019-10-31 NOTE — Telephone Encounter (Signed)
I called the pt because she was a no show for her appt.  The pt said that she couldn't come because it was raining too bad.

## 2019-12-01 NOTE — Progress Notes (Signed)
Pt did not show for appt.  Erroneous encounter.

## 2019-12-17 ENCOUNTER — Ambulatory Visit: Payer: Medicare Other | Admitting: Internal Medicine

## 2019-12-17 ENCOUNTER — Encounter: Payer: Self-pay | Admitting: Internal Medicine

## 2019-12-17 ENCOUNTER — Other Ambulatory Visit: Payer: Self-pay

## 2019-12-17 VITALS — BP 144/76 | HR 75 | Temp 98.2°F | Ht 61.4 in | Wt 137.2 lb

## 2019-12-17 DIAGNOSIS — Z23 Encounter for immunization: Secondary | ICD-10-CM

## 2019-12-17 DIAGNOSIS — E663 Overweight: Secondary | ICD-10-CM

## 2019-12-17 DIAGNOSIS — I129 Hypertensive chronic kidney disease with stage 1 through stage 4 chronic kidney disease, or unspecified chronic kidney disease: Secondary | ICD-10-CM | POA: Diagnosis not present

## 2019-12-17 DIAGNOSIS — E1122 Type 2 diabetes mellitus with diabetic chronic kidney disease: Secondary | ICD-10-CM

## 2019-12-17 DIAGNOSIS — E7800 Pure hypercholesterolemia, unspecified: Secondary | ICD-10-CM | POA: Diagnosis not present

## 2019-12-17 DIAGNOSIS — Z6825 Body mass index (BMI) 25.0-25.9, adult: Secondary | ICD-10-CM

## 2019-12-17 DIAGNOSIS — N183 Chronic kidney disease, stage 3 unspecified: Secondary | ICD-10-CM | POA: Diagnosis not present

## 2019-12-17 MED ORDER — TETANUS-DIPHTH-ACELL PERTUSSIS 5-2.5-18.5 LF-MCG/0.5 IM SUSP: 0.5000 mL | Freq: Once | INTRAMUSCULAR | 0 refills | Status: AC

## 2019-12-17 NOTE — Patient Instructions (Signed)

## 2019-12-17 NOTE — Progress Notes (Signed)
I,Katawbba Wiggins,acting as a Education administrator for Maximino Greenland, MD.,have documented all relevant documentation on the behalf of Maximino Greenland, MD,as directed by  Maximino Greenland, MD while in the presence of Maximino Greenland, MD.  This visit occurred during the SARS-CoV-2 public health emergency.  Safety protocols were in place, including screening questions prior to the visit, additional usage of staff PPE, and extensive cleaning of exam room while observing appropriate contact time as indicated for disinfecting solutions.  Subjective:     Patient ID: Rebekah Avila , female    DOB: 1931/10/17 , 86 y.o.   MRN: [id number]   Chief Complaint  Patient presents with  . Diabetes  . Tdap  . Hypertension    HPI  The patient is here today for a diabetes, hypertension, and cholesterol follow-up.  States she feels well. Has no specific concerns or complaints at this time.   Diabetes She presents for her follow-up diabetic visit. She has type 2 diabetes mellitus. Her disease course has been stable. There are no hypoglycemic associated symptoms. Pertinent negatives for diabetes include no blurred vision. There are no hypoglycemic complications. Risk factors for coronary artery disease include diabetes mellitus, dyslipidemia, hypertension, post-menopausal and sedentary lifestyle. She is following a diabetic diet. She participates in exercise intermittently. Her home blood glucose trend is fluctuating minimally. Her breakfast blood glucose is taken between 8-9 am. Her breakfast blood glucose range is generally 110-130 mg/dl. An ACE inhibitor/angiotensin II receptor blocker is being taken.  Hypertension This is a chronic problem. The current episode started more than 1 year ago. The problem is uncontrolled. Pertinent negatives include no blurred vision. The current treatment provides moderate improvement. Compliance problems include exercise.  Hypertensive end-organ damage includes kidney disease.     Past  Medical History:  Diagnosis Date  . Diabetes ([place])   . High cholesterol   . Hypertension   . Polymyalgia rheumatica (HCC)      Family History  Problem Relation Age of Onset  . Healthy Mother   . Heart attack Father      Current Outpatient Medications:  .  ACCU-CHEK FASTCLIX LANCETS MISC, by Does not apply route., Disp: , Rfl:  .  acetaminophen (TYLENOL) 500 MG tablet, Take 1,000 mg by mouth every 6 (six) hours as needed for moderate pain., Disp: , Rfl:  .  aspirin EC 81 MG tablet, Take 81 mg by mouth daily., Disp: , Rfl:  .  Blood Glucose Monitoring Suppl (ACCU-CHEK NANO SMARTVIEW) w/Device KIT, 1 strip by Does not apply route., Disp: , Rfl:  .  Cholecalciferol (VITAMIN D) 2000 units CAPS, Take by mouth., Disp: , Rfl:  .  Fish Oil OIL, Take 1 capsule by mouth daily., Disp: , Rfl:  .  fluticasone (FLONASE) 50 MCG/ACT nasal spray, Place 1 spray into both nostrils daily as needed for allergies or rhinitis., Disp: , Rfl:  .  hydrochlorothiazide (HYDRODIURIL) 25 MG tablet, TAKE ONE TABLET BY MOUTH ONE TIME DAILY, Disp: 90 tablet, Rfl: 0 .  metoprolol tartrate (LOPRESSOR) 50 MG tablet, TAKE ONE TABLET BY MOUTH TWICE DAILY, Disp: 180 tablet, Rfl: 0 .  olmesartan (BENICAR) 40 MG tablet, TAKE ONE TABLET BY MOUTH ONE TIME DAILY, Disp: 90 tablet, Rfl: 0 .  pravastatin (PRAVACHOL) 40 MG tablet, TAKE ONE TABLET BY MOUTH ONE TIME DAILY (Patient taking differently: 2 tablets by mouth Monday - Friday), Disp: 90 tablet, Rfl: 0   Allergies  Allergen Reactions  . Morphine And Related Rash  Review of Systems  Constitutional: Negative.   Eyes: Negative for blurred vision.  Respiratory: Negative.   Cardiovascular: Negative.   Gastrointestinal: Negative.   Psychiatric/Behavioral: Negative.   All other systems reviewed and are negative.    Today's Vitals   12/17/19 0925  BP: (!) 144/76  Pulse: 75  Temp: 98.2 F (36.8 C)  TempSrc: Oral  Weight: 137 lb 3.2 oz (62.2 kg)  Height: 5' 1.4"  (1.56 m)  PainSc: 0-No pain   Body mass index is 25.59 kg/m.  Wt Readings from Last 3 Encounters:  12/17/19 137 lb 3.2 oz (62.2 kg)  09/26/19 134 lb (60.8 kg)  09/19/19 135 lb 6.4 oz (61.4 kg)   Objective:  Physical Exam Vitals and nursing note reviewed.  Constitutional:      Appearance: Normal appearance.  HENT:     Head: Normocephalic and atraumatic.  Cardiovascular:     Rate and Rhythm: Normal rate and regular rhythm.     Heart sounds: Normal heart sounds.  Pulmonary:     Effort: Pulmonary effort is normal.     Breath sounds: Normal breath sounds.  Skin:    General: Skin is warm.  Neurological:     General: No focal deficit present.     Mental Status: She is alert.  Psychiatric:        Mood and Affect: Mood normal.        Behavior: Behavior normal.         Assessment And Plan:     1. Type 2 diabetes mellitus with stage 3 chronic kidney disease, without long-term current use of insulin, unspecified whether stage 3a or 3b CKD ([place]) Comments: Chronic, I will check labs as listed below. Encouraged to incorporate more exercise into her daily routine. Regarding CKD, encouraged to stay well hydrated and to keep BP/BS well controlled.  - Hemoglobin A1c - CMP14+EGFR - Protein electrophoresis, serum - Parathyroid Hormone, Intact w/Ca - Phosphorus - Referral to Chronic Care Management Services  2. Hypertensive nephropathy Comments: Chronic, fair control. Admits she has yet to take meds today. She is encouraged to take meds as directed and to avoid adding salt to her foods.  - Referral to Chronic Care Management Services  3. Pure hypercholesterolemia Comments: Chronic, I will check fasting lipid panel today. She will rto in Dec 2021 for her next physical examination. She has been taking pravastatin 98m 2 tabs M-F, I will send rx pravastatin 875mM-F.  - Lipid panel - Referral to Chronic Care Management Services  4. Overweight with body mass index (BMI) of 25 to 25.9 in  adult Comments: Her weight is stable for her demographic. Encouraged to incorporate more exercise into her daily routine.   5. Immunization due Comments: Rx Boostrix(Tdap) was sent to her local pharmacy.     Patient was given opportunity to ask questions. Patient verbalized understanding of the plan and was able to repeat key elements of the plan. All questions were answered to their satisfaction.  RoMaximino GreenlandMD   I, RoMaximino GreenlandMD, have reviewed all documentation for this visit. The documentation on 12/21/19 for the exam, diagnosis, procedures, and orders are all accurate and complete.  THE PATIENT IS ENCOURAGED TO PRACTICE SOCIAL DISTANCING DUE TO THE COVID-19 PANDEMIC.

## 2019-12-19 LAB — PROTEIN ELECTROPHORESIS, SERUM
A/G Ratio: 1 (ref 0.7–1.7)
Albumin ELP: 3.9 g/dL (ref 2.9–4.4)
Alpha 1: 0.2 g/dL (ref 0.0–0.4)
Alpha 2: 0.9 g/dL (ref 0.4–1.0)
Beta: 1.3 g/dL (ref 0.7–1.3)
Gamma Globulin: 1.7 g/dL (ref 0.4–1.8)
Globulin, Total: 4.1 g/dL — ABNORMAL HIGH (ref 2.2–3.9)

## 2019-12-19 LAB — PTH, INTACT AND CALCIUM: PTH: 29 pg/mL (ref 15–65)

## 2019-12-19 LAB — HEMOGLOBIN A1C
Est. average glucose Bld gHb Est-mCnc: 143 mg/dL
Hgb A1c MFr Bld: 6.6 % — ABNORMAL HIGH (ref 4.8–5.6)

## 2019-12-19 LAB — PHOSPHORUS: Phosphorus: 2.8 mg/dL — ABNORMAL LOW (ref 3.0–4.3)

## 2019-12-19 LAB — CMP14+EGFR
ALT: 25 IU/L (ref 0–32)
AST: 33 IU/L (ref 0–40)
Albumin/Globulin Ratio: 1.2 (ref 1.2–2.2)
Albumin: 4.3 g/dL (ref 3.6–4.6)
Alkaline Phosphatase: 60 IU/L (ref 48–121)
BUN/Creatinine Ratio: 11 — ABNORMAL LOW (ref 12–28)
BUN: 11 mg/dL (ref 8–27)
Bilirubin Total: 0.6 mg/dL (ref 0.0–1.2)
CO2: 24 mmol/L (ref 20–29)
Calcium: 9.8 mg/dL (ref 8.7–10.3)
Chloride: 103 mmol/L (ref 96–106)
Creatinine, Ser: 0.96 mg/dL (ref 0.57–1.00)
GFR calc Af Amer: 62 mL/min/{1.73_m2} (ref >59)
GFR calc non Af Amer: 54 mL/min/{1.73_m2} — ABNORMAL LOW (ref >59)
Globulin, Total: 3.7 g/dL (ref 1.5–4.5)
Glucose: 124 mg/dL — ABNORMAL HIGH (ref 65–99)
Potassium: 3.8 mmol/L (ref 3.5–5.2)
Sodium: 141 mmol/L (ref 134–144)
Total Protein: 8 g/dL (ref 6.0–8.5)

## 2019-12-19 LAB — LIPID PANEL
Chol/HDL Ratio: 4.3 ratio (ref 0.0–4.4)
Cholesterol, Total: 195 mg/dL (ref 100–199)
HDL: 45 mg/dL (ref >39)
LDL Chol Calc (NIH): 126 mg/dL — ABNORMAL HIGH (ref 0–99)
Triglycerides: 131 mg/dL (ref 0–149)
VLDL Cholesterol Cal: 24 mg/dL (ref 5–40)

## 2019-12-20 ENCOUNTER — Telehealth: Payer: Self-pay

## 2019-12-20 NOTE — Telephone Encounter (Signed)
-----   Message from Dorothyann Peng, MD sent at 12/19/2019  6:50 PM EDT ----- Your hba1c is stable, 6.6. Your kidney function has improved. Great job! Be sure to continue to stay well hydrated. Your LDL, bad chol is 126, goal is less than 70. Have you been taking 2 pravastatin daily M-F? If yes, I will send new rx pravastatin 80mg  daily M-F #75/1 refill. Your phosphorus level is low. I suggest slightly increasing your intake of phosphorus rich foods - milk, beans, nuts and lentils.

## 2019-12-20 NOTE — Telephone Encounter (Signed)
Attempt to give lab results no vm set up 

## 2019-12-21 MED ORDER — PRAVASTATIN SODIUM 80 MG PO TABS: ORAL_TABLET | ORAL | 2 refills | Status: DC

## 2019-12-23 ENCOUNTER — Telehealth: Payer: Self-pay | Admitting: *Deleted

## 2019-12-23 NOTE — Chronic Care Management (AMB) (Signed)
  Chronic Care Management   Note  12/23/2019 Name: Rebekah Avila MRN: [id number] DOB: 05-20-1931  Rebekah Avila is a 86 y.o. year old female who is a primary care patient of [name], MD. I reached out to [name] by phone today in response to a referral sent by [name]'s PCP, [name], MD.     [name] was given information about Chronic Care Management services today including:  1. CCM service includes personalized support from designated clinical staff supervised by her physician, including individualized plan of care and coordination with other care providers 2. 24/7 contact phone numbers for assistance for urgent and routine care needs. 3. Service will only be billed when office clinical staff spend 20 minutes or more in a month to coordinate care. 4. Only one practitioner may furnish and bill the service in a calendar month. 5. The patient may stop CCM services at any time (effective at the end of the month) by phone call to the office staff. 6. The patient will be responsible for cost sharing (co-pay) of up to 20% of the service fee (after annual deductible is met).  Patient agreed to services and verbal consent obtained.   Follow up plan: Telephone appointment with care management team member scheduled for: 01/01/2020  [facility]  [place], [place] [address] Direct Dial: [email or web address] Website: [email or web address]

## 2019-12-27 ENCOUNTER — Other Ambulatory Visit: Payer: Self-pay

## 2019-12-27 MED ORDER — PRAVASTATIN SODIUM 80 MG PO TABS: ORAL_TABLET | ORAL | 2 refills | Status: DC

## 2019-12-31 ENCOUNTER — Telehealth: Payer: Self-pay

## 2019-12-31 NOTE — Chronic Care Management (AMB) (Signed)
    Chronic Care Management Pharmacy Assistant   Name: Rebekah Avila  MRN: [id number] DOB: [birth year]  Reason for Encounter: Medication Review / Initial Questions for Initial Pharmacist Visit.  Patient Questions:  Have you seen any other providers since your last visit? No Any changes in your medications or health? no Any side effects from any medications? no Do you have an symptoms or problems not managed by your medications? no Any concerns about your health right now? No  Has your provider asked that you check blood pressure, blood sugar, or follow special diet at home? Yes- Patient checks blood pressure and blood sugars every other morning, she did not have any readings to provide but will check in the morning prior to visit.  Do you get any type of exercise on a regular basis? Yes- Patient states she is very active, she walks up and down her stairs 4-5 times a day.  Can you think of a goal you would like to reach for your health? Yes- patient has started by increasing her water intake.  Do you have any problems getting your medications? No- Patient states she has to ask someone to get her medications for her. [facility] was mentioned to patient in alleviating the worry of getting someone to get her medications from the pharmacy and home delivery. Patient was very excited about this and would like to change to [facility], Rebekah Avila, CPP aware.   Is there anything that you would like to discuss during the appointment? Not that she can think of.   Patient aware to have her medications, supplements, blood pressure and blood sugar logs near her during telephone  appointment on 01/01/20.    PCP : Rebekah Chard, MD  Allergies:   Allergies  Allergen Reactions  . Morphine And Related Rash    Medications: Outpatient Encounter Medications as of 12/31/2019  Medication Sig  . ACCU-CHEK FASTCLIX LANCETS MISC by Does not apply route.  Marland Kitchen acetaminophen  (TYLENOL) 500 MG tablet Take 1,000 mg by mouth every 6 (six) hours as needed for moderate pain.  Marland Kitchen aspirin EC 81 MG tablet Take 81 mg by mouth daily.  . Blood Glucose Monitoring Suppl (ACCU-CHEK NANO SMARTVIEW) w/Device KIT 1 strip by Does not apply route.  . Cholecalciferol (VITAMIN D) 2000 units CAPS Take by mouth.  . Fish Oil OIL Take 1 capsule by mouth daily.  . fluticasone (FLONASE) 50 MCG/ACT nasal spray Place 1 spray into both nostrils daily as needed for allergies or rhinitis.  . hydrochlorothiazide (HYDRODIURIL) 25 MG tablet TAKE ONE TABLET BY MOUTH ONE TIME DAILY  . metoprolol tartrate (LOPRESSOR) 50 MG tablet TAKE ONE TABLET BY MOUTH TWICE DAILY  . olmesartan (BENICAR) 40 MG tablet TAKE ONE TABLET BY MOUTH ONE TIME DAILY  . pravastatin (PRAVACHOL) 80 MG tablet One tab po M-F, skip weekends   No facility-administered encounter medications on file as of 12/31/2019.    Current Diagnosis: Patient Active Problem List   Diagnosis Date Noted  . Polymyalgia rheumatica ([place]) 08/19/2019  . Type 2 diabetes mellitus with stage 3 chronic kidney disease, without long-term current use of insulin ([name]) 04/12/2018  . Nephropathy 08/03/2017  . Hypertensive kidney disease with chronic kidney disease stage III 08/03/2017  . TIA (transient ischemic attack) 06/30/2013  . HTN (hypertension) 06/30/2013    Follow-Up:  Pharmacist Review- Patient would like to start with [facility], preparing onboarding sheet for visit.  Rebekah Avila, [place] Pharmacist Assistant [phone]

## 2020-01-01 ENCOUNTER — Ambulatory Visit: Payer: Medicare Other

## 2020-01-01 ENCOUNTER — Other Ambulatory Visit: Payer: Self-pay

## 2020-01-01 DIAGNOSIS — E1122 Type 2 diabetes mellitus with diabetic chronic kidney disease: Secondary | ICD-10-CM

## 2020-01-01 DIAGNOSIS — I129 Hypertensive chronic kidney disease with stage 1 through stage 4 chronic kidney disease, or unspecified chronic kidney disease: Secondary | ICD-10-CM

## 2020-01-01 DIAGNOSIS — N183 Chronic kidney disease, stage 3 unspecified: Secondary | ICD-10-CM

## 2020-01-01 DIAGNOSIS — E7800 Pure hypercholesterolemia, unspecified: Secondary | ICD-10-CM

## 2020-01-01 NOTE — Chronic Care Management (AMB) (Signed)
Chronic Care Management Pharmacy  Name: Rebekah Avila  MRN: [id number] DOB: 05/29/1931  Chief Complaint/ HPI  Rebekah Avila,  86 y.o. , female presents for their Initial CCM visit with the clinical pharmacist via telephone due to COVID-19 Pandemic.  PCP : [name], MD  Their chronic conditions include: Hypertension, Hyperlipidemia and Diabetes  Office Visits: 12/17/19 OV: DM/HTN/cholesterol follow up. HgbA1c stable at 6.6%. Kidney function has improved. LDL is 126 (above goal of less than 70). Pt has been taking 2 pravastatin 71m tablets, send in rx for pravastatin 854mdaily Monday-Friday. Phosphorus was low, increase intake of phosphorus rich foods (milk, beans, nuts, lentils). Tdap rx sent to local pharmacy. Referred to CCM. BP/BS controlled.    08/19/19 OV: HgbA1c up to 6.6%. Kidney function is stable. Be sure to stay well hydrated. Muscle enzymes are elevated but improved from last visit. Vitamin B12 is on low end of normal. Pt will be scheduled for weekly Vitamin B12 injections for 4 weeks then once monthly for 3 months (will help with memory and balance). Thyroid function is normal.   04/17/19 AWV and OV: Annual physical exam. Diabetic foot exam performed. HgbA1c 6.5%. HTN fair control. Muscle enzyme elevated, Recommend staying hydrated and taking magnesium once daily (250-40050maily). LDL is 115, above goal of less than 70. Liver function normal. Kidney function stable.   Consult Visits:  CCM Encounters:  Medications: Outpatient Encounter Medications as of 01/01/2020  Medication Sig  . ACCU-CHEK FASTCLIX LANCETS MISC by Does not apply route.  . aMarland Kitchenetaminophen (TYLENOL) 500 MG tablet Take 1,000 mg by mouth every 6 (six) hours as needed for moderate pain.  . aMarland Kitchenpirin EC 81 MG tablet Take 81 mg by mouth daily.  . Blood Glucose Monitoring Suppl (ACCU-CHEK NANO SMARTVIEW) w/Device KIT 1 strip by Does not apply route.  . Cholecalciferol (VITAMIN D) 2000 units CAPS Take 1 capsule  by mouth daily.   . Fish Oil OIL Take 1 capsule by mouth daily. 1000m27m fluticasone (FLONASE) 50 MCG/ACT nasal spray Place 1 spray into both nostrils daily as needed for allergies or rhinitis.  . omMarland Kitchenprazole (PRILOSEC OTC) 20 MG tablet Take 20 mg by mouth daily. Bedtime  . pravastatin (PRAVACHOL) 80 MG tablet One tab po M-F, skip weekends  . [DISCONTINUED] hydrochlorothiazide (HYDRODIURIL) 25 MG tablet TAKE ONE TABLET BY MOUTH ONE TIME DAILY  . [DISCONTINUED] metoprolol tartrate (LOPRESSOR) 50 MG tablet TAKE ONE TABLET BY MOUTH TWICE DAILY  . [DISCONTINUED] olmesartan (BENICAR) 40 MG tablet TAKE ONE TABLET BY MOUTH ONE TIME DAILY   No facility-administered encounter medications on file as of 01/01/2020.    Current Diagnosis/Assessment:  SDOH Interventions     Most Recent Value  SDOH Interventions  Financial Strain Interventions Other (Comment)  [Performed medication cost comparison between [facility] and [facility]]      Goals Addressed            This Visit's Progress   . Pharmacy Care Plan       CARE PLAN ENTRY (see longitudinal plan of care for additional care plan information)  Current Barriers:  . Chronic Disease Management support, education, and care coordination needs related to Hypertension, Hyperlipidemia, and Diabetes   Hypertension BP Readings from Last 3 Encounters:  12/17/19 (!) 144/76  09/26/19 138/76  09/19/19 114/60   . Pharmacist Clinical Goal(s): o Over the next 90 days, patient will work with PharmD and providers to achieve BP goal <130/80 . Current regimen:  o Hydrochlorothiazide  3m once daily o Metoprolol tartrate 576mtwice daily o Olmesartan 4073mnce daily . Interventions: o Provided dietary and exercise recommendations . Patient self care activities - Over the next 90 days, patient will: o Check BP every there day, document, and provide at future appointments o Ensure daily salt intake < 2300 mg/day o Exercise for 30 minutes daily 5  times per week (150 minutes per week total)  Hyperlipidemia Lab Results  Component Value Date/Time   LDLCALC 126 (H) 12/17/2019 10:08 AM   . Pharmacist Clinical Goal(s): o Over the next 90 days, patient will work with PharmD and providers to achieve LDL goal < 70 . Current regimen:  o Pravastatin 73m44mnday-Friday o Omega-3 fish oil 1000mg21mly . Interventions: o Provided dietary and exercise recommendations . Patient self care activities - Over the next 90 days, patient will: o Exercise for 30 minutes daily 5 times per week (150 minutes per week total) o Take pravastatin daily as directed  Diabetes Lab Results  Component Value Date/Time   HGBA1C 6.6 (H) 12/17/2019 10:08 AM   HGBA1C 6.6 (H) 08/19/2019 12:25 PM   HGBA1C 6.8 08/03/2017 12:00 AM   . Pharmacist Clinical Goal(s): o Over the next 180 days, patient will work with PharmD and providers to maintain A1c goal <7% . Current regimen:  o N/A . Interventions: o Provided dietary and exercise recommendations o Discussed appropriate goal for fasting blood sugar (80-130) . Patient self care activities - Over the next 180 days, patient will: o Check blood sugar once daily, document, and provide at future appointments o Contact provider with any episodes of hypoglycemia  Medication management . Pharmacist Clinical Goal(s): o Over the next 180 days, patient will work with PharmD and providers to maintain optimal medication adherence . [facility] o Comprehensive medication review performed. o Utilize [facility] for medication synchronization, packaging and delivery o Collaborate with PCP regarding monthly B12 injections . Patient self care activities - Over the next 180 days, patient will: o Focus on medication adherence by utilization of adherence packaging and medication synchronization o Take medications as prescribed o Report any questions or concerns to PharmD and/or  provider(s)  Initial goal documentation        Diabetes   A1c goal <7%  Recent Relevant Labs: Lab Results  Component Value Date/Time   HGBA1C 6.6 (H) 12/17/2019 10:08 AM   HGBA1C 6.6 (H) 08/19/2019 12:25 PM   HGBA1C 6.8 08/03/2017 12:00 AM   MICROALBUR 80 04/17/2019 12:01 PM   MICROALBUR 30 04/12/2018 04:28 PM    Last diabetic Eye exam:  Lab Results  Component Value Date/Time   HMDIABEYEEXA No Retinopathy 01/03/2019 12:00 AM    Last diabetic Foot exam: 04/17/19  Checking BG: every day to every other day  Recent FBG Readings: 125, 124,112, 120, 117 Recent pre-meal BG readings:  Recent 2hr PP BG readings:   Recent HS BG readings:   Patient has failed these meds in past: N/A Patient is currently controlled on the following medications: . N/A  We discussed:  . Diet extensively o Pt has sweets sometimes (likes candy) o States that she tries to eat vegetables o Eats green beans, bread, pinto beans, fruits, cabbage, Ritz crackers (some days eats a whole sleeve), meat, sweet potatoes o Recommend limiting candy due to high sugar content o Recommend portion control . Exercise extensively o Does not walk a lot outside but goes up and down the stairs 4-5 times per day o  Pt does some stretching and has a pedal bike she uses some while sitting o Recommend pt get 30 minutes of moderate intensity exercise daily 5 times a week (150 minutes total per week) . FBG goal 80-130 . Pt states her blood sugar is usually around 120  . Pt states she was not drinking enough water o Now she says that she drinks water all day long  Plan Continue control with diet and exercise   Hyperlipidemia   LDL goal < 70  Lipid Panel     Component Value Date/Time   CHOL 195 12/17/2019 1008   TRIG 131 12/17/2019 1008   HDL 45 12/17/2019 1008   LDLCALC 126 (H) 12/17/2019 1008    Hepatic Function Latest Ref Rng & Units 12/17/2019 04/17/2019 08/13/2018  Total Protein 6.0 - 8.5 g/dL 8.0 8.2 8.1    Albumin 3.6 - 4.6 g/dL 4.3 4.4 4.6  AST 0 - 40 IU/L 33 31 29  ALT 0 - 32 IU/L '25 18 16  ' Alk Phosphatase 48 - 121 IU/L 60 69 68  Total Bilirubin 0.0 - 1.2 mg/dL 0.6 0.5 0.7     The ASCVD Risk score Mikey Bussing DC Jr., et al., 2013) failed to calculate for the following reasons:   The 2013 ASCVD risk score is only valid for ages 51 to 25   Patient has failed these meds in past: Rosuvastatin Patient is currently uncontrolled on the following medications:  . Pravastatin 85m Monday-Friday . Omega-3 fish oil 10033mdaily  We discussed:   . Pt has still been taking 2 tablets of 402mn Monday-Friday . Pt has not picked up new prescription for pravastatin 46m3m Advised pt that once she picks up 46mg18m will only need to take 1 tablet  . LDL had gotten a little worse (115 to 126 at last office visit) . Denies muscle pains/cramps with medication  Plan Continue current medications   Hypertension   BP goal is:  <130/80  Office blood pressures are  BP Readings from Last 3 Encounters:  12/17/19 (!) 144/76  09/26/19 138/76  09/19/19 114/60   Patient checks BP at home Discuss at follow up Patient home BP readings are ranging: None provided today  Patient has failed these meds in the past: Lisinopril Patient is currently uncontrolled on the following medications:  . Hydrochlorothiazide 25mg 33m daily . Metoprolol tartrate 50mg t6m daily . Olmesartan 40mg on2maily  Plan Continue current medications   Health Maintenance   Patient is currently on the following medications:  . Fluticasone 50mcg/ac54mspray into each nostril daily as needed . Tylenol 500mg 2 ta42ms as needed every 6 hours . Aspirin 81mg daily39mmeprazole 20mg daily 73medtime  We discussed:   . Pt does not Tylenol everyday, just as needed for aches and pains . Pt uses Flonase as needed when she has runny nose (like when pollen gets bad) . Pt is taking omeprazole for indigestion at bedtime; pt has been  taking it for years o Pt has not tried stopping in a long time o Recommend avoid triggers (spicy and acidic foods, etc) o Recommend decrease gradually (every other day for 2 weeks, then decrease to twice a week for a few weeks) . Pt asked if she needs to take a daily Vitamin B12 supplement o Discussed plan per PCP OV note to complete weekly B12 injection for 4 weeks (completed) followed by monthly injections for 3 months  Plan Continue current medications  Collaborate  with PCP regarding Vitamin B12 injections Recommend follow up Vitamin B12 level after completing B12 injections  Vaccines   Reviewed and discussed patient's vaccination history.    Immunization History  Administered Date(s) Administered  . Influenza, High Dose Seasonal PF 02/05/2018, 12/17/2018  . PFIZER SARS-COV-2 Vaccination 05/18/2019, 06/21/2019  . Pneumococcal Polysaccharide-23 08/19/2019   Plan Review and discuss at follow up  Medication Management   Pt uses [facility] for all medications Uses pill box? Yes Pt endorses 99% compliance  We discussed:  . Importance of taking each medications daily as directed . Medication synchronization, adherence packaging, and delivery available with [facility] . Pt does not have Part D insurance to cover medications. Performed cost review with [facility] o Per pt, prices with [facility] are cheaper than current prescription prices at [facility] for medication synchronization, packaging and delivery  Verbal consent obtained for [facility] enhanced pharmacy services (medication synchronization, adherence packaging, delivery coordination). A medication sync plan was created to allow patient to get all medications delivered once every 30 to 90 days per patient preference. Patient understands they have freedom to choose pharmacy and clinical pharmacist will coordinate care between all prescribers and [facility].  Follow up: 6 week phone visit  [name], PharmD Clinical Pharmacist [facility] [phone]

## 2020-01-02 ENCOUNTER — Other Ambulatory Visit: Payer: Self-pay

## 2020-01-02 MED ORDER — OLMESARTAN MEDOXOMIL 40 MG PO TABS
40.0000 mg | ORAL_TABLET | Freq: Every day | ORAL | 2 refills | Status: DC
Start: 2020-01-02 — End: 2020-08-20

## 2020-01-02 MED ORDER — METOPROLOL TARTRATE 50 MG PO TABS
50.0000 mg | ORAL_TABLET | Freq: Two times a day (BID) | ORAL | 2 refills | Status: DC
Start: 2020-01-02 — End: 2020-08-20

## 2020-01-02 MED ORDER — HYDROCHLOROTHIAZIDE 25 MG PO TABS
25.0000 mg | ORAL_TABLET | Freq: Every day | ORAL | 2 refills | Status: DC
Start: 2020-01-02 — End: 2020-08-20

## 2020-01-22 NOTE — Patient Instructions (Addendum)
Visit Information  Goals Addressed            This Visit's Progress   . Pharmacy Care Plan       CARE PLAN ENTRY (see longitudinal plan of care for additional care plan information)  Current Barriers:  . Chronic Disease Management support, education, and care coordination needs related to Hypertension, Hyperlipidemia, and Diabetes   Hypertension BP Readings from Last 3 Encounters:  12/17/19 (!) 144/76  09/26/19 138/76  09/19/19 114/60   . Pharmacist Clinical Goal(s): o Over the next 90 days, patient will work with PharmD and providers to achieve BP goal <130/80 . Current regimen:  o Hydrochlorothiazide 25mg  once daily o Metoprolol tartrate 50mg  twice daily o Olmesartan 40mg  once daily . Interventions: o Provided dietary and exercise recommendations . Patient self care activities - Over the next 90 days, patient will: o Check BP every there day, document, and provide at future appointments o Ensure daily salt intake < 2300 mg/day o Exercise for 30 minutes daily 5 times per week (150 minutes per week total)  Hyperlipidemia Lab Results  Component Value Date/Time   LDLCALC 126 (H) 12/17/2019 10:08 AM   . Pharmacist Clinical Goal(s): o Over the next 90 days, patient will work with PharmD and providers to achieve LDL goal < 70 . Current regimen:  o Pravastatin 80mg  Monday-Friday o Omega-3 fish oil 1000mg  daily . Interventions: o Provided dietary and exercise recommendations . Patient self care activities - Over the next 90 days, patient will: o Exercise for 30 minutes daily 5 times per week (150 minutes per week total) o Take pravastatin daily as directed  Diabetes Lab Results  Component Value Date/Time   HGBA1C 6.6 (H) 12/17/2019 10:08 AM   HGBA1C 6.6 (H) 08/19/2019 12:25 PM   HGBA1C 6.8 08/03/2017 12:00 AM   . Pharmacist Clinical Goal(s): o Over the next 180 days, patient will work with PharmD and providers to maintain A1c goal <7% . Current regimen:   o N/A . Interventions: o Provided dietary and exercise recommendations o Discussed appropriate goal for fasting blood sugar (80-130) . Patient self care activities - Over the next 180 days, patient will: o Check blood sugar once daily, document, and provide at future appointments o Contact provider with any episodes of hypoglycemia  Medication management . Pharmacist Clinical Goal(s): o Over the next 180 days, patient will work with PharmD and providers to maintain optimal medication adherence . Current pharmacy: . Interventions o Comprehensive medication review performed. o Utilize [facility] pharmacy for medication synchronization, packaging and delivery o Collaborate with PCP regarding monthly B12 injections . Patient self care activities - Over the next 180 days, patient will: o Focus on medication adherence by utilization of adherence packaging and medication synchronization o Take medications as prescribed o Report any questions or concerns to PharmD and/or provider(s)  Initial goal documentation        Ms. Amedee was given information about Chronic Care Management services today including:  1. CCM service includes personalized support from designated clinical staff supervised by her physician, including individualized plan of care and coordination with other care providers 2. 24/7 contact phone numbers for assistance for urgent and routine care needs. 3. Standard insurance, coinsurance, copays and deductibles apply for chronic care management only during months in which we provide at least 20 minutes of these services. Most insurances cover these services at 100%, however patients may be responsible for any copay, coinsurance and/or deductible if applicable. This service may help  you avoid the need for more expensive face-to-face services. 4. Only one practitioner may furnish and bill the service in a calendar month. 5. The patient may stop CCM services at any  time (effective at the end of the month) by phone call to the office staff.  Patient agreed to services and verbal consent obtained.   The patient verbalized understanding of instructions provided today and agreed to receive a mailed copy of patient instruction and/or educational materials. Telephone follow up appointment with pharmacy team member scheduled for: 02/07/20 @ 11:30 AM  Beryle Flock, PharmD Clinical Pharmacist [facility] [phone]   Diabetes Mellitus and Nutrition, Adult When you have diabetes (diabetes mellitus), it is very important to have healthy eating habits because your blood sugar (glucose) levels are greatly affected by what you eat and drink. Eating healthy foods in the appropriate amounts, at about the same times every day, can help you:  Control your blood glucose.  Lower your risk of heart disease.  Improve your blood pressure.  Reach or maintain a healthy weight. Every person with diabetes is different, and each person has different needs for a meal plan. Your health care provider may recommend that you work with a diet and nutrition specialist (dietitian) to make a meal plan that is best for you. Your meal plan may vary depending on factors such as:  The calories you need.  The medicines you take.  Your weight.  Your blood glucose, blood pressure, and cholesterol levels.  Your activity level.  Other health conditions you have, such as heart or kidney disease. How do carbohydrates affect me? Carbohydrates, also called carbs, affect your blood glucose level more than any other type of food. Eating carbs naturally raises the amount of glucose in your blood. Carb counting is a method for keeping track of how many carbs you eat. Counting carbs is important to keep your blood glucose at a healthy level, especially if you use insulin or take certain oral diabetes medicines. It is important to know how many carbs you can safely  have in each meal. This is different for every person. Your dietitian can help you calculate how many carbs you should have at each meal and for each snack. Foods that contain carbs include:  Bread, cereal, rice, pasta, and crackers.  Potatoes and corn.  Peas, beans, and lentils.  Milk and yogurt.  Fruit and juice.  Desserts, such as cakes, cookies, ice cream, and candy. How does alcohol affect me? Alcohol can cause a sudden decrease in blood glucose (hypoglycemia), especially if you use insulin or take certain oral diabetes medicines. Hypoglycemia can be a life-threatening condition. Symptoms of hypoglycemia (sleepiness, dizziness, and confusion) are similar to symptoms of having too much alcohol. If your health care provider says that alcohol is safe for you, follow these guidelines:  Limit alcohol intake to no more than 1 drink per day for nonpregnant women and 2 drinks per day for men. One drink equals 12 oz of beer, 5 oz of wine, or 1 oz of hard liquor.  Do not drink on an empty stomach.  Keep yourself hydrated with water, diet soda, or unsweetened iced tea.  Keep in mind that regular soda, juice, and other mixers may contain a lot of sugar and must be counted as carbs. What are tips for following this plan?  Reading food labels  Start by checking the serving size on the "Nutrition Facts" label of packaged foods and drinks. The amount of calories,  carbs, fats, and other nutrients listed on the label is based on one serving of the item. Many items contain more than one serving per package.  Check the total grams (g) of carbs in one serving. You can calculate the number of servings of carbs in one serving by dividing the total carbs by 15. For example, if a food has 30 g of total carbs, it would be equal to 2 servings of carbs.  Check the number of grams (g) of saturated and trans fats in one serving. Choose foods that have low or no amount of these fats.  Check the number of  milligrams (mg) of salt (sodium) in one serving. Most people should limit total sodium intake to less than 2,300 mg per day.  Always check the nutrition information of foods labeled as "low-fat" or "nonfat". These foods may be higher in added sugar or refined carbs and should be avoided.  Talk to your dietitian to identify your daily goals for nutrients listed on the label. Shopping  Avoid buying canned, premade, or processed foods. These foods tend to be high in fat, sodium, and added sugar.  Shop around the outside edge of the grocery store. This includes fresh fruits and vegetables, bulk grains, fresh meats, and fresh dairy. Cooking  Use low-heat cooking methods, such as baking, instead of high-heat cooking methods like deep frying.  Cook using healthy oils, such as olive, canola, or sunflower oil.  Avoid cooking with butter, cream, or high-fat meats. Meal planning  Eat meals and snacks regularly, preferably at the same times every day. Avoid going long periods of time without eating.  Eat foods high in fiber, such as fresh fruits, vegetables, beans, and whole grains. Talk to your dietitian about how many servings of carbs you can eat at each meal.  Eat 4-6 ounces (oz) of lean protein each day, such as lean meat, chicken, fish, eggs, or tofu. One oz of lean protein is equal to: ? 1 oz of meat, chicken, or fish. ? 1 egg. ?  cup of tofu.  Eat some foods each day that contain healthy fats, such as avocado, nuts, seeds, and fish. Lifestyle  Check your blood glucose regularly.  Exercise regularly as told by your health care provider. This may include: ? 150 minutes of moderate-intensity or vigorous-intensity exercise each week. This could be brisk walking, biking, or water aerobics. ? Stretching and doing strength exercises, such as yoga or weightlifting, at least 2 times a week.  Take medicines as told by your health care provider.  Do not use any products that contain nicotine  or tobacco, such as cigarettes and e-cigarettes. If you need help quitting, ask your health care provider.  Work with a Veterinary surgeon or diabetes educator to identify strategies to manage stress and any emotional and social challenges. Questions to ask a health care provider  Do I need to meet with a diabetes educator?  Do I need to meet with a dietitian?  What number can I call if I have questions?  When are the best times to check my blood glucose? Where to find more information:  American Diabetes Association: diabetes.org  Academy of Nutrition and Dietetics: www.eatright.[employer] of Diabetes and Digestive and Kidney Diseases (NIH): CarFlippers.tn Summary  A healthy meal plan will help you control your blood glucose and maintain a healthy lifestyle.  Working with a diet and nutrition specialist (dietitian) can help you make a meal plan that is best for you.  Keep  in mind that carbohydrates (carbs) and alcohol have immediate effects on your blood glucose levels. It is important to count carbs and to use alcohol carefully. This information is not intended to replace advice given to you by your health care provider. Make sure you discuss any questions you have with your health care provider. Document Revised: 03/24/2017 Document Reviewed: 05/16/2016 Elsevier Patient Education  2020 [employer].

## 2020-01-28 ENCOUNTER — Ambulatory Visit (INDEPENDENT_AMBULATORY_CARE_PROVIDER_SITE_OTHER): Payer: Medicare Other

## 2020-01-28 ENCOUNTER — Other Ambulatory Visit: Payer: Self-pay

## 2020-01-28 VITALS — BP 128/60 | HR 74 | Temp 98.0°F | Ht 61.4 in | Wt 139.0 lb

## 2020-01-28 DIAGNOSIS — E538 Deficiency of other specified B group vitamins: Secondary | ICD-10-CM | POA: Diagnosis not present

## 2020-01-28 MED ORDER — CYANOCOBALAMIN 1000 MCG/ML IJ SOLN
1000.0000 ug | Freq: Once | INTRAMUSCULAR | Status: AC
  Administered 2020-01-28: 1000 ug via INTRAMUSCULAR

## 2020-01-28 NOTE — Progress Notes (Signed)
Pt is here today for a b-12 injection

## 2020-02-07 ENCOUNTER — Telehealth: Payer: Self-pay

## 2020-02-08 ENCOUNTER — Ambulatory Visit: Payer: Self-pay | Attending: Internal Medicine

## 2020-02-08 DIAGNOSIS — Z23 Encounter for immunization: Secondary | ICD-10-CM

## 2020-02-08 NOTE — Progress Notes (Signed)
   Covid-19 Vaccination Clinic  Name:  Rebekah Avila    MRN: [id number] DOB: November 17, 1931  02/08/2020  [name] was observed post Covid-19 immunization for 15 minutes without incident. She was provided with Vaccine Information Sheet and instruction to access the V-Safe system.   [name] was instructed to call 911 with any severe reactions post vaccine: Marland Kitchen Difficulty breathing  . Swelling of face and throat  . A fast heartbeat  . A bad rash all over body  . Dizziness and weakness

## 2020-02-11 ENCOUNTER — Ambulatory Visit (INDEPENDENT_AMBULATORY_CARE_PROVIDER_SITE_OTHER): Payer: Medicare Other

## 2020-02-11 ENCOUNTER — Other Ambulatory Visit: Payer: Self-pay

## 2020-02-11 VITALS — BP 124/68 | HR 70 | Temp 98.7°F | Ht 61.4 in | Wt 138.2 lb

## 2020-02-11 DIAGNOSIS — Z23 Encounter for immunization: Secondary | ICD-10-CM

## 2020-02-11 NOTE — Progress Notes (Signed)
Pt is here today for influenza vaccine

## 2020-02-12 DIAGNOSIS — H11823 Conjunctivochalasis, bilateral: Secondary | ICD-10-CM | POA: Diagnosis not present

## 2020-02-12 DIAGNOSIS — H02831 Dermatochalasis of right upper eyelid: Secondary | ICD-10-CM | POA: Diagnosis not present

## 2020-02-12 DIAGNOSIS — E119 Type 2 diabetes mellitus without complications: Secondary | ICD-10-CM | POA: Diagnosis not present

## 2020-02-12 DIAGNOSIS — H04123 Dry eye syndrome of bilateral lacrimal glands: Secondary | ICD-10-CM | POA: Diagnosis not present

## 2020-02-12 LAB — HM DIABETES EYE EXAM

## 2020-02-13 ENCOUNTER — Encounter: Payer: Self-pay | Admitting: Nurse Practitioner

## 2020-02-14 ENCOUNTER — Telehealth: Payer: Self-pay | Admitting: Pharmacist

## 2020-02-14 NOTE — Chronic Care Management (AMB) (Signed)
Chronic Care Management Pharmacy Assistant    Name: AKSHITA ITALIANO  MRN: [id number] DOB: [birth year]    Reason for Encounter: Disease State - Diabetes, Hypertension and Lipids Adherence Call.   Patient Questions:  1.  Have you seen any other providers since your last visit?  No   2.  Any changes in your medicines or health? No    PCP : [name], MD  Allergies:   Allergies  Allergen Reactions  . Morphine And Related Rash    Medications: Outpatient Encounter Medications as of 02/14/2020  Medication Sig  . ACCU-CHEK FASTCLIX LANCETS MISC by Does not apply route.  Marland Kitchen acetaminophen (TYLENOL) 500 MG tablet Take 1,000 mg by mouth every 6 (six) hours as needed for moderate pain.  Marland Kitchen aspirin EC 81 MG tablet Take 81 mg by mouth daily.  . Blood Glucose Monitoring Suppl (ACCU-CHEK NANO SMARTVIEW) w/Device KIT 1 strip by Does not apply route.  . Cholecalciferol (VITAMIN D) 2000 units CAPS Take 1 capsule by mouth daily.   . Fish Oil OIL Take 1 capsule by mouth daily. 1037m  . fluticasone (FLONASE) 50 MCG/ACT nasal spray Place 1 spray into both nostrils daily as needed for allergies or rhinitis.  . hydrochlorothiazide (HYDRODIURIL) 25 MG tablet Take 1 tablet (25 mg total) by mouth daily.  . metoprolol tartrate (LOPRESSOR) 50 MG tablet Take 1 tablet (50 mg total) by mouth 2 (two) times daily.  .Marland Kitchenolmesartan (BENICAR) 40 MG tablet Take 1 tablet (40 mg total) by mouth daily.  .Marland Kitchenomeprazole (PRILOSEC OTC) 20 MG tablet Take 20 mg by mouth daily. Bedtime  . pravastatin (PRAVACHOL) 80 MG tablet One tab po M-F, skip weekends   No facility-administered encounter medications on file as of 02/14/2020.    Current Diagnosis: Patient Active Problem List   Diagnosis Date Noted  . [place]umatica ([place] 08/19/2019  . Type 2 diabetes mellitus with stage 3 chronic kidney disease, without long-term current use of insulin ([place] 04/12/2018  . Nephropathy 08/03/2017  . Hypertensive kidney  disease with chronic kidney disease stage III ([place] 08/03/2017  . TIA (transient ischemic attack) 06/30/2013  . HTN (hypertension) 06/30/2013   Recent Relevant Labs: Lab Results  Component Value Date/Time   HGBA1C 6.6 (H) 12/17/2019 10:08 AM   HGBA1C 6.6 (H) 08/19/2019 12:25 PM   HGBA1C 6.8 08/03/2017 12:00 AM   MICROALBUR 80 04/17/2019 12:01 PM   MICROALBUR 30 04/12/2018 04:28 PM    Kidney Function Lab Results  Component Value Date/Time   CREATININE 0.96 12/17/2019 10:08 AM   CREATININE 1.04 (H) 08/19/2019 12:25 PM   GFRNONAA 54 (L) 12/17/2019 10:08 AM   GFRAA 62 12/17/2019 10:08 AM    . Current antihyperglycemic regimen:   o None  . What recent interventions/DTPs have been made to improve glycemic control:   o Patient states she does take her medication as directed by provider .  Have there been any recent hospitalizations or ED visits since last visit with CPP?  No  . Patient denies hypoglycemic symptoms, including Pale, Sweaty, Shaky, Hungry, Nervous/irritable and Vision changes . Patient denies hyperglycemic symptoms, including blurry vision, excessive thirst, fatigue, polyuria and weakness   . How often are you checking your blood sugar?  Patient states once a day   . What are your blood sugars ranging?  o Fasting: 10/22 /2021- 137  o Before meals: None o After meals: None o Bedtime: None  . During the week, how often does your blood glucose  drop below 70? No    . Are you checking your feet daily/regularly?  Patient denies any open sores , no numbness, does have some pain.  Adherence Review: Is the patient currently on a STATIN medication? Pravastatin 80 mg  Is the patient currently on ACE/ARB medication? Olmesartan 40 mg Does the patient have >5 day gap between last estimated fill dates? No  Reviewed chart prior to disease state call. Spoke with patient regarding BP  Recent Office Vitals: BP Readings from Last 3 Encounters:  02/11/20 124/68  01/28/20  128/60  12/17/19 (!) 144/76   Pulse Readings from Last 3 Encounters:  02/11/20 70  01/28/20 74  12/17/19 75    Wt Readings from Last 3 Encounters:  02/11/20 138 lb 3.2 oz (62.7 kg)  01/28/20 139 lb (63 kg)  12/17/19 137 lb 3.2 oz (62.2 kg)     Kidney Function Lab Results  Component Value Date/Time   CREATININE 0.96 12/17/2019 10:08 AM   CREATININE 1.04 (H) 08/19/2019 12:25 PM   GFRNONAA 54 (L) 12/17/2019 10:08 AM   GFRAA 62 12/17/2019 10:08 AM    BMP Latest Ref Rng & Units 12/17/2019 08/19/2019 04/17/2019  Glucose 65 - 99 mg/dL 124(H) 116(H) 109(H)  BUN 8 - 27 mg/dL '11 13 15  ' Creatinine 0.57 - 1.00 mg/dL 0.96 1.04(H) 1.09(H)  BUN/Creat Ratio 12 - 28 11(L) 13 14  Sodium 134 - 144 mmol/L 141 141 144  Potassium 3.5 - 5.2 mmol/L 3.8 3.9 3.9  Chloride 96 - 106 mmol/L 103 101 102  CO2 20 - 29 mmol/L '24 25 24  ' Calcium 8.7 - 10.3 mg/dL 9.8 9.9 10.1    . Current antihypertensive regimen:   o Hydrochlorothiazide 25 mg once daily o Metoprolol Tartrate 50 mg twice a day o Olmesartan 40 mg daily  . How often are you checking your Blood Pressure?  Patient could not find her diary states her blood pressures have been okay.  Current home BP readings: none  . What recent interventions/DTPs have been made by any provider to improve Blood Pressure control since last CPP Visit: Patient states she takes her medications as directed by provider.  . Any recent hospitalizations or ED visits since last visit with CPP? No    . What diet changes have been made to improve Blood Pressure Control?  o Patient states she does have a  exercise regiment 15 minutes a day. 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 No problems going up and down stairs .  Marland Kitchen What exercise is being done to improve your Blood Pressure Control?  o Patient states she does have a  exercise regiment 15 minutes a day. 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 No problems going up and down stairs .  Adherence Review: Is the patient currently on ACE/ARB medication? Olmesartan 40 mg  Does the patient have >5 day gap between last estimated fill dates? No   Comprehensive medication review performed; Spoke to patient regarding cholesterol  Lipid Panel    Component Value Date/Time   CHOL 195 12/17/2019 1008   TRIG 131 12/17/2019 1008   HDL 45 12/17/2019 1008   LDLCALC 126 (H) 12/17/2019 1008    10-year ASCVD risk score: The ASCVD Risk score Mikey Bussing DC Jr., et al., 2013) failed to  calculate for the following reasons:   The 2013 ASCVD risk score is only valid for ages 59 to 70  . Current antihyperlipidemic regimen:  o Pravastatin 80 mg Monday - Friday o Omega - 3 fish oil 1000 mg daily  . Previous antihyperlipidemic medications tried: None  . ASCVD risk enhancing conditions: age >58, DM, HTN, CKD, CHF, current smoker  . What recent interventions/DTPs have been made by any provider to improve Cholesterol control since last CPP Visit: Patient states she takes her medications as directed by provider.  . Any recent hospitalizations or ED visits since last visit with CPP?  No  . What diet changes have been made to improve Cholesterol?  o Patient states she does have a  exercise regiment 15 minutes a day. 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 No problems going up and down stairs .   Marland Kitchen What exercise is being done to improve Cholesterol?  o Patient states she does have a  exercise regiment 15 minutes a day. 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 No problems going up and down stairs .    Adherence Review: Does the patient have >5 day gap between last estimated fill dates? No      Goals Addressed            This Visit's  Progress   . Pharmacy Care Plan   On track    CARE PLAN ENTRY (see longitudinal plan of care for additional care plan information)  Current Barriers:  . Chronic Disease Management support, education, and care coordination needs related to Hypertension, Hyperlipidemia, and Diabetes   Hypertension BP Readings from Last 3 Encounters:  12/17/19 (!) 144/76  09/26/19 138/76  09/19/19 114/60   . Pharmacist Clinical Goal(s): o Over the next 90 days, patient will work with PharmD and providers to achieve BP goal <130/80 . Current regimen:  o Hydrochlorothiazide 41m once daily o Metoprolol tartrate 545mtwice daily o Olmesartan 4056mnce daily . Interventions: o Provided dietary and exercise recommendations . Patient self care activities - Over the next 90 days, patient will: o Check BP every there day, document, and provide at future appointments o Ensure daily salt intake < 2300 mg/day o Exercise for 30 minutes daily 5 times per week (150 minutes per week total)  Hyperlipidemia Lab Results  Component Value Date/Time   LDLCALC 126 (H) 12/17/2019 10:08 AM   . Pharmacist Clinical Goal(s): o Over the next 90 days, patient will work with PharmD and providers to achieve LDL goal < 70 . Current regimen:  o Pravastatin 56m30mnday-Friday o Omega-3 fish oil 1000mg9mly . Interventions: o Provided dietary and exercise recommendations . Patient self care activities - Over the next 90 days, patient will: o Exercise for 30 minutes daily 5 times per week (150 minutes per week total) o Take pravastatin daily as directed  Diabetes Lab Results  Component Value Date/Time   HGBA1C 6.6 (H) 12/17/2019 10:08 AM   HGBA1C 6.6 (H) 08/19/2019 12:25 PM   HGBA1C 6.8 08/03/2017 12:00 AM   . Pharmacist Clinical Goal(s): o Over the next 180 days, patient will work with PharmD and providers to maintain A1c goal <7% . Current regimen:  o N/A . Interventions: o Provided dietary and exercise  recommendations o Discussed appropriate  goal for fasting blood sugar (80-130) . Patient self care activities - Over the next 180 days, patient will: o Check blood sugar once daily, document, and provide at future appointments o Contact provider with any episodes of hypoglycemia  Medication management . Pharmacist Clinical Goal(s): o Over the next 180 days, patient will work with PharmD and providers to maintain optimal medication adherence . Current pharmacy: [facility] . Interventions o Comprehensive medication review performed. o Utilize [facility] pharmacy for medication synchronization, packaging and delivery o Collaborate with PCP regarding monthly B12 injections . Patient self care activities - Over the next 180 days, patient will: o Focus on medication adherence by utilization of adherence packaging and medication synchronization o Take medications as prescribed o Report any questions or concerns to PharmD and/or provider(s)  Initial goal documentation        Follow-Up:  Pharmacist Review - Patient states she got her flu/ covid booster vaccine.   Beverly Milch, CPP Notified   Judithann Sheen, [facility] Clinical Pharmacist Assistant [phone]

## 2020-02-26 ENCOUNTER — Telehealth: Payer: Self-pay | Admitting: Pharmacist

## 2020-02-26 NOTE — Chronic Care Management (AMB) (Signed)
Chronic Care Management Pharmacy Assistant   Name: Rebekah Avila  MRN: [id number] DOB: 1931/05/21  Reason for Encounter: Medication Review/ Monthly Dispensing Call  Patient Questions:  1.  Have you seen any other providers since your last visit? No  2.  Any changes in your medicines or health? No   PCP : [name], MD  Allergies:   Allergies  Allergen Reactions  . Morphine And Related Rash    Medications: Outpatient Encounter Medications as of 02/26/2020  Medication Sig  . ACCU-CHEK FASTCLIX LANCETS MISC by Does not apply route.  Marland Kitchen acetaminophen (TYLENOL) 500 MG tablet Take 1,000 mg by mouth every 6 (six) hours as needed for moderate pain.  Marland Kitchen aspirin EC 81 MG tablet Take 81 mg by mouth daily.  . Blood Glucose Monitoring Suppl (ACCU-CHEK NANO SMARTVIEW) w/Device KIT 1 strip by Does not apply route.  . Cholecalciferol (VITAMIN D) 2000 units CAPS Take 1 capsule by mouth daily.   . Fish Oil OIL Take 1 capsule by mouth daily. 1042m  . fluticasone (FLONASE) 50 MCG/ACT nasal spray Place 1 spray into both nostrils daily as needed for allergies or rhinitis.  . hydrochlorothiazide (HYDRODIURIL) 25 MG tablet Take 1 tablet (25 mg total) by mouth daily.  . metoprolol tartrate (LOPRESSOR) 50 MG tablet Take 1 tablet (50 mg total) by mouth 2 (two) times daily.  .Marland Kitchenolmesartan (BENICAR) 40 MG tablet Take 1 tablet (40 mg total) by mouth daily.  .Marland Kitchenomeprazole (PRILOSEC OTC) 20 MG tablet Take 20 mg by mouth daily. Bedtime  . pravastatin (PRAVACHOL) 80 MG tablet One tab po M-F, skip weekends   No facility-administered encounter medications on file as of 02/26/2020.    Current Diagnosis: Patient Active Problem List   Diagnosis Date Noted  . Polym[place]algia [place]umatica ([facility] 08/19/2019  . Type 2 diabetes mellitus with stage 3 chronic kidney disease, without long-term current use of insulin ([facility] 04/12/2018  . Nephropathy 08/03/2017  . Hypertensive kidney disease with chronic kidney disease  stage III ([place] 08/03/2017  . TIA (transient ischemic attack) 06/30/2013  . HTN (hypertension) 06/30/2013   Reviewed chart for medication changes ahead of medication coordination call.  No OVs, Consults, or hospital visits since last care coordination call/Pharmacist visit.   No medication changes indicated.  BP Readings from Last 3 Encounters:  02/11/20 124/68  01/28/20 128/60  12/17/19 (!) 144/76    Lab Results  Component Value Date   HGBA1C 6.6 (H) 12/17/2019     Patient obtains medications through Adherence Packaging  90 Days   Last adherence delivery included:(9/17 &01/02/2020) New Onboard with [facility].  Hydrochlorothiazide 25 mg- 1 tablet daily (breakfast) Olmesartan 40 mg- 1 tablet daily (breakfast) Pravastatin 80 mg- 1 tablet Monday- Friday, skip weekends(evening meal)  Patient declined the following medications last month: Metoprolol Tartrate 50 mg- 1 tablet twice daily (breakfast and evening meal) due to having a 2 month supply on hand. Tylenol 500 mg- 2 tablet every 6 hours as needed for pain due to getting OTC. Asa 81 mg- 1 tablet daily (evening meal) due to having an adequate OTC supply, Vitamin D 2000 units- 1 tablet daily (breakfast) due to having an adequate OTC supply. Fluticasone NS- 1 spray each nostril due to PRN use and Omega-3 Fish oil 1000 mg- 1 capsule daily (breakfast) due to having an adequate OTC supply.  Patient is due for next adherence delivery on: 02/28/2020. Called patient and reviewed medications and coordinated delivery.  This delivery to include:  Hydrochlorothiazide 25 mg- 1 tablet daily (breakfast) Olmesartan 40 mg- 1 tablet daily (breakfast) Pravastatin 80 mg- 1 tablet Monday- Friday, skip weekends(evening meal) Metoprolol Tartrate 50 mg- 1 tablet twice daily (breakfast and evening meal)    Patient will need a short fill of Metoprolol 50 mg, prior to adherence delivery.   Coordinated acute fill for Metoprolol 50 mg to be delivered  02/27/2020.  Patient declined the following medications: Tylenol 500 mg- 2 tablet every 6 hours as needed for pain due to getting OTC. Asa 81 mg- 1 tablet daily (evening meal) due to having an adequate OTC supply, Vitamin D 2000 units- 1 tablet daily (breakfast) due to having an adequate OTC supply. Fluticasone NS- 1 spray each nostril due to PRN use and Omega-3 Fish oil 1000 mg- 1 capsule daily (breakfast) due to having an adequate OTC supply.   No refills needed.  Confirmed delivery date of 02/28/2020, advised patient that pharmacy will contact them the morning of delivery.   Follow-Up:  Coordination of Enhanced Pharmacy Services and Pharmacist Review  Patient will be out of Metoprolol prior to delivery, acute form placed. Leata Mouse, CPP notified. tm

## 2020-03-12 ENCOUNTER — Telehealth: Payer: Self-pay

## 2020-03-18 ENCOUNTER — Telehealth: Payer: Self-pay

## 2020-03-18 NOTE — Chronic Care Management (AMB) (Signed)
Chronic Care Management Pharmacy Assistant   Name: Rebekah Avila  MRN: [id number] DOB: 05-28-1931  Reason for Encounter: Medication Review / Monthly Dispensing Call.   Patient Questions:   1.  Have you seen any other providers since your last visit? No   2.  Any changes in your medicines or health? No   .  PCP : [name], MD  Allergies:   Allergies  Allergen Reactions  . Morphine And Related Rash    Medications: Outpatient Encounter Medications as of 03/18/2020  Medication Sig  . ACCU-CHEK FASTCLIX LANCETS MISC by Does not apply route.  Marland Kitchen acetaminophen (TYLENOL) 500 MG tablet Take 1,000 mg by mouth every 6 (six) hours as needed for moderate pain.  Marland Kitchen aspirin EC 81 MG tablet Take 81 mg by mouth daily.  . Blood Glucose Monitoring Suppl (ACCU-CHEK NANO SMARTVIEW) w/Device KIT 1 strip by Does not apply route.  . Cholecalciferol (VITAMIN D) 2000 units CAPS Take 1 capsule by mouth daily.   . Fish Oil OIL Take 1 capsule by mouth daily. 1088m  . fluticasone (FLONASE) 50 MCG/ACT nasal spray Place 1 spray into both nostrils daily as needed for allergies or rhinitis.  . hydrochlorothiazide (HYDRODIURIL) 25 MG tablet Take 1 tablet (25 mg total) by mouth daily.  . metoprolol tartrate (LOPRESSOR) 50 MG tablet Take 1 tablet (50 mg total) by mouth 2 (two) times daily.  .Marland Kitchenolmesartan (BENICAR) 40 MG tablet Take 1 tablet (40 mg total) by mouth daily.  .Marland Kitchenomeprazole (PRILOSEC OTC) 20 MG tablet Take 20 mg by mouth daily. Bedtime  . pravastatin (PRAVACHOL) 80 MG tablet One tab po M-F, skip weekends   No facility-administered encounter medications on file as of 03/18/2020.    Current Diagnosis: Patient Active Problem List   Diagnosis Date Noted  . Polym[place]algia [place]umatica ([place] 08/19/2019  . Type 2 diabetes mellitus with stage 3 chronic kidney disease, without long-term current use of insulin ([facility] 04/12/2018  . Nephropathy 08/03/2017  . Hypertensive kidney disease with chronic  kidney disease stage III ([facility] 08/03/2017  . TIA (transient ischemic attack) 06/30/2013  . HTN (hypertension) 06/30/2013   Reviewed chart for medication changes ahead of medication coordination call.  No OVs, Consults, or hospital visits since last care coordination call/Pharmacist visit.  No medication changes indicated.  BP Readings from Last 3 Encounters:  02/11/20 124/68  01/28/20 128/60  12/17/19 (!) 144/76    Lab Results  Component Value Date   HGBA1C 6.6 (H) 12/17/2019     Patient obtains medications through Adherence Packaging  90 Days   Last adherence delivery included: (02/28/2020) Hydrochlorothiazide 25 mg- 1 tablet daily (breakfast) Olmesartan 40 mg- 1 tablet daily (breakfast) Pravastatin 80 mg- 1 tablet Monday- Friday, skip weekends(evening meal) Metoprolol Tartrate 50 mg- 1 tablet twice daily (breakfast and evening meal  Patient declined the following medications  last month: Tylenol 500 mg- 2 tablet every 6 hours as needed for pain due to getting OTC.  Asa 81 mg- 1 tablet daily (evening meal) due to having an adequate OTC supply,  Vitamin D 2000 units- 1 tablet daily (breakfast) due to having an adequate OTC supply.  Fluticasone NS- 1 spray each nostril due to PRN use   Omega-3 Fish oil 1000 mg- 1 capsule daily (breakfast) due to having an adequate OTC supply. Omeprazole OTC  PRN -    Patient is due for next adherence delivery on: 03/27/2020.  Called patient and reviewed medications and coordinated delivery.  No Medication  delivery at this time. Due to 90 days supply delivery 02/28/2020.   Patient declined the following medications (meds) due to (reason) Hydrochlorothiazide 25 mg- 1 tablet daily (breakfast) Olmesartan 40 mg- 1 tablet daily (breakfast) Pravastatin 80 mg- 1 tablet Monday- Friday, skip weekends(evening meal) Metoprolol Tartrate 50 mg- 1 tablet twice daily (breakfast and evening meal Tylenol 500 mg- 2 tablet every 6 hours as needed for pain  due to getting OTC.  Asa 81 mg- 1 tablet daily (evening meal) due to having an adequate OTC supply,  Vitamin D 2000 units- 1 tablet daily (breakfast) due to having an adequate OTC supply.  Fluticasone NS- 1 spray each nostril due to PRN use   Omega-3 Fish oil 1000 mg- 1 capsule daily (breakfast) due to having an adequate OTC supply. Omeprazole OTC  PRN  No Refills Needed    Follow-Up:  Coordination of Enhanced Pharmacy Services - Patient states she does not need any refills at this time.  Vallie Pearson,CPP. Notified Judithann Sheen, [place] Pharmacist Assistant [phone]

## 2020-04-21 ENCOUNTER — Telehealth: Payer: Self-pay

## 2020-04-21 ENCOUNTER — Encounter: Payer: Medicare Other | Admitting: Nurse Practitioner

## 2020-04-21 NOTE — Chronic Care Management (AMB) (Signed)
Chronic Care Management Pharmacy Assistant   Name: Rebekah Avila  MRN: [id number] DOB: 25-Aug-1931  Reason for Encounter: Medication Review  Patient Questions:  1.  Have you seen any other providers since your last visit? No  2.  Any changes in your medicines or health? No  PCP : [name], MD  Allergies:   Allergies  Allergen Reactions  . Morphine And Related Rash    Medications: Outpatient Encounter Medications as of 04/21/2020  Medication Sig  . ACCU-CHEK FASTCLIX LANCETS MISC by Does not apply route.  Marland Kitchen acetaminophen (TYLENOL) 500 MG tablet Take 1,000 mg by mouth every 6 (six) hours as needed for moderate pain.  Marland Kitchen aspirin EC 81 MG tablet Take 81 mg by mouth daily.  . Blood Glucose Monitoring Suppl (ACCU-CHEK NANO SMARTVIEW) w/Device KIT 1 strip by Does not apply route.  . Cholecalciferol (VITAMIN D) 2000 units CAPS Take 1 capsule by mouth daily.   . Fish Oil OIL Take 1 capsule by mouth daily. 1091m  . fluticasone (FLONASE) 50 MCG/ACT nasal spray Place 1 spray into both nostrils daily as needed for allergies or rhinitis.  . hydrochlorothiazide (HYDRODIURIL) 25 MG tablet Take 1 tablet (25 mg total) by mouth daily.  . metoprolol tartrate (LOPRESSOR) 50 MG tablet Take 1 tablet (50 mg total) by mouth 2 (two) times daily.  .Marland Kitchenolmesartan (BENICAR) 40 MG tablet Take 1 tablet (40 mg total) by mouth daily.  .Marland Kitchenomeprazole (PRILOSEC OTC) 20 MG tablet Take 20 mg by mouth daily. Bedtime  . pravastatin (PRAVACHOL) 80 MG tablet One tab po M-F, skip weekends   No facility-administered encounter medications on file as of 04/21/2020.    Current Diagnosis: Patient Active Problem List   Diagnosis Date Noted  . Polymyalgia rheumatica ([facility] 08/19/2019  . Type 2 diabetes mellitus with stage 3 chronic kidney disease, without long-term current use of insulin ([facility] 04/12/2018  . Nephropathy 08/03/2017  . Hypertensive kidney disease with chronic kidney disease stage III ([facility] 08/03/2017   . TIA (transient ischemic attack) 06/30/2013  . HTN (hypertension) 06/30/2013   Reviewed chart for medication changes ahead of medication coordination call.  No OVs, Consults, or hospital visits since last care coordination call/Pharmacist visit.  No medication changes indicated.  BP Readings from Last 3 Encounters:  02/11/20 124/68  01/28/20 128/60  12/17/19 (!) 144/76    Lab Results  Component Value Date   HGBA1C 6.6 (H) 12/17/2019     Patient obtains medications through Adherence Packaging  90 Days   Last adherence delivery included: Medication not needed determined at last adherence call on 03/18/2020. Patient received 90 day packaging on 02/28/2020.  Patient declined the following medications last month: Hydrochlorothiazide 25 mg- 1 tablet daily (breakfast) Olmesartan 40 mg- 1 tablet daily (breakfast) Pravastatin 80 mg- 1 tablet Monday- Friday, skip weekends(evening meal) Metoprolol Tartrate 50 mg- 1 tablet twice daily (breakfast and evening meal)   Due to receiving a 90 day supply on 02/28/2020  Tylenol 500 mg- 2 tablet every 6 hours as needed for pain due to getting OTC.  Asa 81 mg- 1 tablet daily (evening meal) due to having an adequate OTC supply,  Vitamin D 2000 units- 1 tablet daily (breakfast) due to having an adequate OTC supply.  Fluticasone NS- 1 spray each nostril due to PRN use   Omega-3 Fish oil 1000 mg- 1 capsule daily (breakfast) due to having an adequate OTC supply. Omeprazole OTC  PRN  Patient is due for next adherence delivery  on: 04/28/2020. Called patient and reviewed medications.  This delivery to include: None- Patient is not in need of any medications at this time, she still has 1 more adherence packaging box left.   No Short fill or acute fill needed.  Patient declined the following medications : Hydrochlorothiazide 25 mg- 1 tablet daily (breakfast) Olmesartan 40 mg- 1 tablet daily (breakfast) Pravastatin 80 mg- 1 tablet Monday- Friday, skip  weekends(evening meal) Metoprolol Tartrate 50 mg- 1 tablet twice daily (breakfast and evening meal)   Due to receiving a 90 day supply on 02/28/2020  Tylenol 500 mg- 2 tablet every 6 hours as needed for pain due to getting OTC.  Asa 81 mg- 1 tablet daily (evening meal) due to having an adequate OTC supply,  Vitamin D 2000 units- 1 tablet daily (breakfast) due to having an adequate OTC supply.  Fluticasone NS- 1 spray each nostril due to PRN use   Omega-3 Fish oil 1000 mg- 1 capsule daily (breakfast) due to having an adequate OTC supply. Omeprazole OTC  PRN  No refills needed.  Patient aware that we will follow up with her next month to review medications and coordinate delivery.  Follow-Up:  Coordination of Enhanced Pharmacy Services and Pharmacist Review  Patient is checking her blood pressures and blood sugars daily. Patient states blood pressure will be elevated at times, nothing over 150/90. Recent blood pressure readings:129/73, 136/65, 129/69, 128/86, 145/79, 128/75, 134/71, 116/65, 131/69, 127/70, 131/70, 129/73, 136/68.  Recent blood sugar readings: 132, 123, 137, 127, 132, 123, 135, 130, 138, 119, 126, 125, 135, 133.  All readings are from mornings, fasting. Patient also mentioned that she is drinking more water, her daughter gave her a jug that has times on the bottle on when and how much she should be drinking. Patient also states she does some exercises every morning. Patient aware I will relay all of her readings to Orlando Penner, CPP.  Orlando Penner, CPP notified.  [name], [place] Pharmacist Assistant [phone]

## 2020-04-22 ENCOUNTER — Encounter: Payer: Medicare Other | Admitting: Nurse Practitioner

## 2020-05-07 ENCOUNTER — Other Ambulatory Visit: Payer: Self-pay

## 2020-05-07 ENCOUNTER — Ambulatory Visit (INDEPENDENT_AMBULATORY_CARE_PROVIDER_SITE_OTHER): Payer: Medicare Other

## 2020-05-07 VITALS — BP 138/80 | HR 76 | Temp 97.7°F | Ht 61.0 in | Wt 138.8 lb

## 2020-05-07 DIAGNOSIS — Z0000 Encounter for general adult medical examination without abnormal findings: Secondary | ICD-10-CM | POA: Diagnosis not present

## 2020-05-07 NOTE — Patient Instructions (Signed)
[name] , Thank you for taking time to come for your Medicare Wellness Visit. I appreciate your ongoing commitment to your health goals. Please review the following plan we discussed and let me know if I can assist you in the future.   Screening recommendations/referrals: Colonoscopy: not required Mammogram: not required Bone Density: completed 04/21/2016 Recommended yearly ophthalmology/optometry visit for glaucoma screening and checkup Recommended yearly dental visit for hygiene and checkup  Vaccinations: Influenza vaccine: completed 02/11/2020, due 11/23/2020 Pneumococcal vaccine: completed 08/19/2019 Tdap vaccine: due Shingles vaccine: discussed   Covid-19: 02/08/2020, 06/21/2019, 05/18/2019  Advanced directives: Advance directive discussed with you today. Even though you declined this today please call our office should you change your mind and we can give you the proper paperwork for you to fill out.   Conditions/risks identified: none  Next appointment: Follow up in one year for your annual wellness visit  Completed 08/19/2019  Preventive Care 65 Years and Older, Female Preventive care refers to lifestyle choices and visits with your health care provider that can promote health and wellness. What does preventive care include?  A yearly physical exam. This is also called an annual well check.  Dental exams once or twice a year.  Routine eye exams. Ask your health care provider how often you should have your eyes checked.  Personal lifestyle choices, including:  Daily care of your teeth and gums.  Regular physical activity.  Eating a healthy diet.  Avoiding tobacco and drug use.  Limiting alcohol use.  Practicing safe sex.  Taking low-dose aspirin every day.  Taking vitamin and mineral supplements as recommended by your health care provider. What happens during an annual well check? The services and screenings done by your health care provider during your annual  well check will depend on your age, overall health, lifestyle risk factors, and family history of disease. Counseling  Your health care provider may ask you questions about your:  Alcohol use.  Tobacco use.  Drug use.  Emotional well-being.  Home and relationship well-being.  Sexual activity.  Eating habits.  History of falls.  Memory and ability to understand (cognition).  Work and work Astronomer.  Reproductive health. Screening  You may have the following tests or measurements:  Height, weight, and BMI.  Blood pressure.  Lipid and cholesterol levels. These may be checked every 5 years, or more frequently if you are over 74 years old.  Skin check.  Lung cancer screening. You may have this screening every year starting at age 38 if you have a 30-pack-year history of smoking and currently smoke or have quit within the past 15 years.  Fecal occult blood test (FOBT) of the stool. You may have this test every year starting at age 87.  Flexible sigmoidoscopy or colonoscopy. You may have a sigmoidoscopy every 5 years or a colonoscopy every 10 years starting at age 38.  Hepatitis C blood test.  Hepatitis B blood test.  Sexually transmitted disease (STD) testing.  Diabetes screening. This is done by checking your blood sugar (glucose) after you have not eaten for a while (fasting). You may have this done every 1-3 years.  Bone density scan. This is done to screen for osteoporosis. You may have this done starting at age 67.  Mammogram. This may be done every 1-2 years. Talk to your health care provider about how often you should have regular mammograms. Talk with your health care provider about your test results, treatment options, and if necessary, the need for more  tests. Vaccines  Your health care provider may recommend certain vaccines, such as:  Influenza vaccine. This is recommended every year.  Tetanus, diphtheria, and acellular pertussis (Tdap, Td) vaccine.  You may need a Td booster every 10 years.  Zoster vaccine. You may need this after age 4.  Pneumococcal 13-valent conjugate (PCV13) vaccine. One dose is recommended after age 64.  Pneumococcal polysaccharide (PPSV23) vaccine. One dose is recommended after age 31. Talk to your health care provider about which screenings and vaccines you need and how often you need them. This information is not intended to replace advice given to you by your health care provider. Make sure you discuss any questions you have with your health care provider. Document Released: 05/08/2015 Document Revised: 12/30/2015 Document Reviewed: 02/10/2015 Elsevier Interactive Patient Education  2017 [place] Prevention in the Home Falls can cause injuries. They can happen to people of all ages. There are many things you can do to make your home safe and to help prevent falls. What can I do on the outside of my home?  Regularly fix the edges of walkways and driveways and fix any cracks.  Remove anything that might make you trip as you walk through a door, such as a raised step or threshold.  Trim any bushes or trees on the path to your home.  Use bright outdoor lighting.  Clear any walking paths of anything that might make someone trip, such as rocks or tools.  Regularly check to see if handrails are loose or broken. Make sure that both sides of any steps have handrails.  Any raised decks and porches should have guardrails on the edges.  Have any leaves, snow, or ice cleared regularly.  Use sand or salt on walking paths during winter.  Clean up any spills in your garage right away. This includes oil or grease spills. What can I do in the bathroom?  Use night lights.  Install grab bars by the toilet and in the tub and shower. Do not use towel bars as grab bars.  Use non-skid mats or decals in the tub or shower.  If you need to sit down in the shower, use a plastic, non-slip stool.  Keep the  floor dry. Clean up any water that spills on the floor as soon as it happens.  Remove soap buildup in the tub or shower regularly.  Attach bath mats securely with double-sided non-slip rug tape.  Do not have throw rugs and other things on the floor that can make you trip. What can I do in the bedroom?  Use night lights.  Make sure that you have a light by your bed that is easy to reach.  Do not use any sheets or blankets that are too big for your bed. They should not hang down onto the floor.  Have a firm chair that has side arms. You can use this for support while you get dressed.  Do not have throw rugs and other things on the floor that can make you trip. What can I do in the kitchen?  Clean up any spills right away.  Avoid walking on wet floors.  Keep items that you use a lot in easy-to-reach places.  If you need to reach something above you, use a strong step stool that has a grab bar.  Keep electrical cords out of the way.  Do not use floor polish or wax that makes floors slippery. If you must use wax, use non-skid floor  wax.  Do not have throw rugs and other things on the floor that can make you trip. What can I do with my stairs?  Do not leave any items on the stairs.  Make sure that there are handrails on both sides of the stairs and use them. Fix handrails that are broken or loose. Make sure that handrails are as long as the stairways.  Check any carpeting to make sure that it is firmly attached to the stairs. Fix any carpet that is loose or worn.  Avoid having throw rugs at the top or bottom of the stairs. If you do have throw rugs, attach them to the floor with carpet tape.  Make sure that you have a light switch at the top of the stairs and the bottom of the stairs. If you do not have them, ask someone to add them for you. What else can I do to help prevent falls?  Wear shoes that:  Do not have high heels.  Have rubber bottoms.  Are comfortable and fit  you well.  Are closed at the toe. Do not wear sandals.  If you use a stepladder:  Make sure that it is fully opened. Do not climb a closed stepladder.  Make sure that both sides of the stepladder are locked into place.  Ask someone to hold it for you, if possible.  Clearly mark and make sure that you can see:  Any grab bars or handrails.  First and last steps.  Where the edge of each step is.  Use tools that help you move around (mobility aids) if they are needed. These include:  Canes.  Walkers.  Scooters.  Crutches.  Turn on the lights when you go into a dark area. Replace any light bulbs as soon as they burn out.  Set up your furniture so you have a clear path. Avoid moving your furniture around.  If any of your floors are uneven, fix them.  If there are any pets around you, be aware of where they are.  Review your medicines with your doctor. Some medicines can make you feel dizzy. This can increase your chance of falling. Ask your doctor what other things that you can do to help prevent falls. This information is not intended to replace advice given to you by your health care provider. Make sure you discuss any questions you have with your health care provider. Document Released: 02/05/2009 Document Revised: 09/17/2015 Document Reviewed: 05/16/2014 Elsevier Interactive Patient Education  2017 [employer].

## 2020-05-07 NOTE — Progress Notes (Signed)
This visit occurred during the SARS-CoV-2 public health emergency.  Safety protocols were in place, including screening questions prior to the visit, additional usage of staff PPE, and extensive cleaning of exam room while observing appropriate contact time as indicated for disinfecting solutions.  Subjective:   Rebekah Avila is a 87 y.o. female who presents for Medicare Annual (Subsequent) preventive examination.  Review of Systems     Cardiac Risk Factors include: advanced age (>32mn, >>14women);diabetes mellitus;hypertension     Objective:    Today's Vitals   05/07/20 1047  BP: 138/80  Pulse: 76  Temp: 97.7 F (36.5 C)  TempSrc: Oral  SpO2: 97%  Weight: 138 lb 12.8 oz (63 kg)  Height: _0  (1.549 m)   Body mass index is 26.23 kg/m.  Advanced Directives 05/07/2020 04/17/2019 06/30/2013  Does Patient Have a Medical Advance Directive? No Yes Patient has advance directive, copy not in chart  Type of Advance Directive - Healthcare Power of AOrindaLiving will  Does patient want to make changes to medical advance directive? - - No change requested  Copy of [place] Chart? - No - copy requested Copy requested from family  Would patient like information on creating a medical advance directive? No - Patient declined - -  Pre-existing out of facility DNR order (yellow form or pink MOST form) - - No    Current Medications (verified) Outpatient Encounter Medications as of 05/07/2020  Medication Sig  . ACCU-CHEK FASTCLIX LANCETS MISC by Does not apply route.  .Marland Kitchenacetaminophen (TYLENOL) 500 MG tablet Take 1,000 mg by mouth every 6 (six) hours as needed for moderate pain.  .Marland Kitchenaspirin EC 81 MG tablet Take 81 mg by mouth daily.  . Blood Glucose Monitoring Suppl (ACCU-CHEK NANO SMARTVIEW) w/Device KIT 1 strip by Does not apply route.  . Cholecalciferol (VITAMIN D) 2000 units CAPS Take 1 capsule by mouth daily.   . Fish Oil OIL Take 1  capsule by mouth daily. 10017m . fluticasone (FLONASE) 50 MCG/ACT nasal spray Place 1 spray into both nostrils daily as needed for allergies or rhinitis.  . hydrochlorothiazide (HYDRODIURIL) 25 MG tablet Take 1 tablet (25 mg total) by mouth daily.  . metoprolol tartrate (LOPRESSOR) 50 MG tablet Take 1 tablet (50 mg total) by mouth 2 (two) times daily.  . Marland Kitchenlmesartan (BENICAR) 40 MG tablet Take 1 tablet (40 mg total) by mouth daily.  . Marland Kitchenmeprazole (PRILOSEC OTC) 20 MG tablet Take 20 mg by mouth daily. Bedtime  . pravastatin (PRAVACHOL) 80 MG tablet One tab po M-F, skip weekends   No facility-administered encounter medications on file as of 05/07/2020.    Allergies (verified) Morphine and related   History: Past Medical History:  Diagnosis Date  . [place]  . High cholesterol   . Hypertension   . Polymyalgia rheumatica (HCC)    Past Surgical History:  Procedure Laterality Date  . ECTOPIC PREGNANCY SURGERY    . HERNIA REPAIR     Family History  Problem Relation Age of Onset  . Healthy Mother   . Heart attack Father    Social History   Socioeconomic History  . Marital status: Single    Spouse name: Not on file  . Number of children: Not on file  . Years of education: Not on file  . Highest education level: Not on file  Occupational History  . Occupation: retired  Tobacco Use  . Smoking status: Never Smoker  .  Smokeless tobacco: Never Used  Vaping Use  . Vaping Use: Never used  Substance and Sexual Activity  . Alcohol use: No  . Drug use: No  . Sexual activity: Not Currently  Other Topics Concern  . Not on file  Social History Narrative  . Not on file   Social Determinants of Health   Financial Resource Strain: Low Risk   . Difficulty of Paying Living Expenses: Not hard at all  Food Insecurity: No Food Insecurity  . Worried About Charity fundraiser in the Last Year: Never true  . Ran Out of Food in the Last Year: Never true  Transportation Needs: No  Transportation Needs  . Lack of Transportation (Medical): No  . Lack of Transportation (Non-Medical): No  Physical Activity: Insufficiently Active  . Days of Exercise per Week: 7 days  . Minutes of Exercise per Session: 20 min  Stress: No Stress Concern Present  . Feeling of Stress : Not at all  Social Connections: Not on file    Tobacco Counseling Counseling given: Not Answered   Clinical Intake:  Pre-visit preparation completed: Yes  Pain : No/denies pain     Nutritional Status: BMI 25 -29 Overweight Nutritional Risks: None Diabetes: Yes  How often do you need to have someone help you when you read instructions, pamphlets, or other written materials from your doctor or pharmacy?: 1 - Never What is the last grade level you completed in school?: bs nursing  Diabetic? Yes Nutrition Risk Assessment:  Has the patient had any N/V/D within the last 2 months?  No  Does the patient have any non-healing wounds?  No  Has the patient had any unintentional weight loss or weight gain?  No   Diabetes:  Is the patient diabetic?  Yes  If diabetic, was a CBG obtained today?  No  Did the patient bring in their glucometer from home?  No  How often do you monitor your CBG's? daily.   Financial Strains and Diabetes Management:  Are you having any financial strains with the device, your supplies or your medication? No .  Does the patient want to be seen by Chronic Care Management for management of their diabetes?  No  Would the patient like to be referred to a Nutritionist or for Diabetic Management?  No   Diabetic Exams:  Diabetic Eye Exam: Completed 02/12/2020 Diabetic Foot Exam: Overdue, Pt has been advised about the importance in completing this exam. Pt is scheduled for diabetic foot exam on next appointment.   Interpreter Needed?: No  Information entered by :: NAllen LPN   Activities of Daily Living In your present state of health, do you have any difficulty performing  the following activities: 05/07/2020  Hearing? N  Vision? N  Difficulty concentrating or making decisions? N  Walking or climbing stairs? N  Dressing or bathing? N  Doing errands, shopping? N  Preparing Food and eating ? N  Using the Toilet? N  In the past six months, have you accidently leaked urine? N  Do you have problems with loss of bowel control? N  Managing your Medications? N  Managing your Finances? N  Housekeeping or managing your Housekeeping? N  Some recent data might be hidden    Patient Care Team: [name], MD as PCP - General (Internal Medicine) Mayford Knife, [facility] (Pharmacist)  Indicate any recent Medical Services you may have received from other than [facility] providers in the past year (date may be approximate).  Assessment:   This is a routine wellness examination for Keelynn.  Hearing/Vision screen No exam data present  Dietary issues and exercise activities discussed: Current Exercise Habits: Home exercise routine, Type of exercise: strength training/weights, Time (Minutes): 20, Frequency (Times/Week): 7, Weekly Exercise (Minutes/Week): 140  Goals    . DIET - EAT MORE FRUITS AND VEGETABLES    . Exercise 150 min/wk Moderate Activity    . Patient Stated     04/17/2019, wants to continue with previous goals to eat more fruits and vegetables and exercise regularly    . Patient Stated     05/07/2020, no goals    . Pharmacy Care Plan     CARE PLAN ENTRY (see longitudinal plan of care for additional care plan information)  Current Barriers:  . Chronic Disease Management support, education, and care coordination needs related to Hypertension, Hyperlipidemia, and Diabetes   Hypertension BP Readings from Last 3 Encounters:  12/17/19 (!) 144/76  09/26/19 138/76  09/19/19 114/60   . Pharmacist Clinical Goal(s): o Over the next 90 days, patient will work with PharmD and providers to achieve BP goal <130/80 . Current regimen:  o Hydrochlorothiazide  7m once daily o Metoprolol tartrate 568mtwice daily o Olmesartan 4041mnce daily . Interventions: o Provided dietary and exercise recommendations . Patient self care activities - Over the next 90 days, patient will: o Check BP every there day, document, and provide at future appointments o Ensure daily salt intake < 2300 mg/day o Exercise for 30 minutes daily 5 times per week (150 minutes per week total)  Hyperlipidemia Lab Results  Component Value Date/Time   LDLCALC 126 (H) 12/17/2019 10:08 AM   . Pharmacist Clinical Goal(s): o Over the next 90 days, patient will work with PharmD and providers to achieve LDL goal < 70 . Current regimen:  o Pravastatin 73m77mnday-Friday o Omega-3 fish oil 1000mg77mly . Interventions: o Provided dietary and exercise recommendations . Patient self care activities - Over the next 90 days, patient will: o Exercise for 30 minutes daily 5 times per week (150 minutes per week total) o Take pravastatin daily as directed  Diabetes Lab Results  Component Value Date/Time   HGBA1C 6.6 (H) 12/17/2019 10:08 AM   HGBA1C 6.6 (H) 08/19/2019 12:25 PM   HGBA1C 6.8 08/03/2017 12:00 AM   . Pharmacist Clinical Goal(s): o Over the next 180 days, patient will work with PharmD and providers to maintain A1c goal <7% . Current regimen:  o N/A . Interventions: o Provided dietary and exercise recommendations o Discussed appropriate goal for fasting blood sugar (80-130) . Patient self care activities - Over the next 180 days, patient will: o Check blood sugar once daily, document, and provide at future appointments o Contact provider with any episodes of hypoglycemia  Medication management . Pharmacist Clinical Goal(s): o Over the next 180 days, patient will work with PharmD and providers to maintain optimal medication adherence . [facility] o Comprehensive medication review performed. o Utilize [facility] for  medication synchronization, packaging and delivery o Collaborate with PCP regarding monthly B12 injections . Patient self care activities - Over the next 180 days, patient will: o Focus on medication adherence by utilization of adherence packaging and medication synchronization o Take medications as prescribed o Report any questions or concerns to PharmD and/or provider(s)  Initial goal documentation       Depression Screen PHQ 2/9 Scores 05/07/2020 09/12/2019 08/19/2019 04/17/2019 04/17/2019 04/12/2018 02/05/2018  PHQ -  2 Score 0 0 0 0 0 0 0  PHQ- 9 Score - - - 0 - - -    Fall Risk Fall Risk  05/07/2020 09/12/2019 08/19/2019 04/17/2019 04/17/2019  Falls in the past year? 0 0 0 0 0  Number falls in past yr: - 0 0 - -  Injury with Fall? - 0 0 - -  Risk for fall due to : Medication side effect - - Medication side effect -  Follow up Falls evaluation completed;Education provided;Falls prevention discussed - - Falls evaluation completed;Education provided;Falls prevention discussed -    FALL RISK PREVENTION PERTAINING TO THE HOME:  Any stairs in or around the home? Yes  If so, are there any without handrails? No  Home free of loose throw rugs in walkways, pet beds, electrical cords, etc? Yes  Adequate lighting in your home to reduce risk of falls? Yes   ASSISTIVE DEVICES UTILIZED TO PREVENT FALLS:  Life alert? No  Use of a cane, walker or w/c? No  Grab bars in the bathroom? No  Shower chair or bench in shower? No  Elevated toilet seat or a handicapped toilet? Yes   TIMED UP AND GO:  Was the test performed? No .   Gait steady and fast without use of assistive device  Cognitive Function:     6CIT Screen 05/07/2020 04/17/2019 04/12/2018  What Year? 0 points 0 points 0 points  What month? 0 points 0 points 0 points  What time? 3 points 0 points 0 points  Count back from 20 0 points 0 points 0 points  Months in reverse 0 points 0 points 0 points  Repeat phrase 10 points 8  points 0 points  Total Score 13 8 0    Immunizations Immunization History  Administered Date(s) Administered  . Fluad Quad(high Dose 65+) 02/11/2020  . Influenza, High Dose Seasonal PF 02/05/2018, 12/17/2018  . PFIZER SARS-COV-2 Vaccination 05/18/2019, 06/21/2019, 02/08/2020  . Pneumococcal Polysaccharide-23 08/19/2019    TDAP status: Due, Education has been provided regarding the importance of this vaccine. Advised may receive this vaccine at local pharmacy or Health Dept. Aware to provide a copy of the vaccination record if obtained from local pharmacy or Health Dept. Verbalized acceptance and understanding.  Flu Vaccine status: Up to date  Pneumococcal vaccine status: Up to date  Covid-19 vaccine status: Completed vaccines  Qualifies for Shingles Vaccine? Yes   Zostavax completed No   Shingrix Completed?: No.    Education has been provided regarding the importance of this vaccine. Patient has been advised to call insurance company to determine out of pocket expense if they have not yet received this vaccine. Advised may also receive vaccine at local pharmacy or Health Dept. Verbalized acceptance and understanding.  Screening Tests Health Maintenance  Topic Date Due  . TETANUS/TDAP  07/08/2015  . FOOT EXAM  04/16/2020  . HEMOGLOBIN A1C  06/18/2020  . OPHTHALMOLOGY EXAM  02/11/2021  . INFLUENZA VACCINE  Completed  . DEXA SCAN  Completed  . COVID-19 Vaccine  Completed  . PNA vac Low Risk Adult  Completed    Health Maintenance  Health Maintenance Due  Topic Date Due  . TETANUS/TDAP  07/08/2015  . FOOT EXAM  04/16/2020    Colorectal cancer screening: No longer required.   Mammogram status: No longer required due to age.  Bone Density status: Completed 04/21/2016.   Lung Cancer Screening: (Low Dose CT Chest recommended if Age 62-80 years, 30 pack-year currently smoking OR have quit  w/in 15years.) does not qualify.   Lung Cancer Screening Referral: no  Additional  Screening:  Hepatitis C Screening: does not qualify  Vision Screening: Recommended annual ophthalmology exams for early detection of glaucoma and other disorders of the eye. Is the patient up to date with their annual eye exam?  Yes  Who is the provider or what is the name of the office in which the patient attends annual eye exams? Dr. Katy Fitch If pt is not established with a provider, would they like to be referred to a provider to establish care? No .   Dental Screening: Recommended annual dental exams for proper oral hygiene  Community Resource Referral / Chronic Care Management: CRR required this visit?  No   CCM required this visit?  No      Plan:     I have personally reviewed and noted the following in the patient's chart:   . Medical and social history . Use of alcohol, tobacco or illicit drugs  . Current medications and supplements . Functional ability and status . Nutritional status . Physical activity . Advanced directives . List of other physicians . Hospitalizations, surgeries, and ER visits in previous 12 months . Vitals . Screenings to include cognitive, depression, and falls . Referrals and appointments  In addition, I have reviewed and discussed with patient certain preventive protocols, quality metrics, and best practice recommendations. A written personalized care plan for preventive services as well as general preventive health recommendations were provided to patient.     [name], LPN   7/59/1638   Nurse Notes:

## 2020-05-22 ENCOUNTER — Telehealth: Payer: Self-pay

## 2020-05-22 NOTE — Chronic Care Management (AMB) (Signed)
° ° °  Chronic Care Management Pharmacy Assistant   Name: Rebekah Avila  MRN: [id number] DOB: March 23, 1932  Reason for Encounter: Medication Review  Patient Questions:  1.  Have you seen any other providers since your last visit? Yes, 05/07/20-Allen, Marissa Calamity, LPN  2.  Any changes in your medicines or health? No    PCP : [name], MD  Allergies:   Allergies  Allergen Reactions   Morphine And Related Rash    Medications: Outpatient Encounter Medications as of 05/22/2020  Medication Sig   ACCU-CHEK FASTCLIX LANCETS MISC by Does not apply route.   acetaminophen (TYLENOL) 500 MG tablet Take 1,000 mg by mouth every 6 (six) hours as needed for moderate pain.   aspirin EC 81 MG tablet Take 81 mg by mouth daily.   Blood Glucose Monitoring Suppl (ACCU-CHEK NANO SMARTVIEW) w/Device KIT 1 strip by Does not apply route.   Cholecalciferol (VITAMIN D) 2000 units CAPS Take 1 capsule by mouth daily.    Fish Oil OIL Take 1 capsule by mouth daily. 1032m   fluticasone (FLONASE) 50 MCG/ACT nasal spray Place 1 spray into both nostrils daily as needed for allergies or rhinitis.   hydrochlorothiazide (HYDRODIURIL) 25 MG tablet Take 1 tablet (25 mg total) by mouth daily.   metoprolol tartrate (LOPRESSOR) 50 MG tablet Take 1 tablet (50 mg total) by mouth 2 (two) times daily.   olmesartan (BENICAR) 40 MG tablet Take 1 tablet (40 mg total) by mouth daily.   omeprazole (PRILOSEC OTC) 20 MG tablet Take 20 mg by mouth daily. Bedtime   pravastatin (PRAVACHOL) 80 MG tablet One tab po M-F, skip weekends   No facility-administered encounter medications on file as of 05/22/2020.    Current Diagnosis: Patient Active Problem List   Diagnosis Date Noted   Pol[place]yalgia rheumatica ([place] 08/19/2019   Type 2 diabetes mellitus with stage 3 chronic kidney disease, without long-term current use of insulin ([place] 04/12/2018   Nephropathy 08/03/2017   Hypertensive kidney disease with chronic kidney  disease stage III ([place] 08/03/2017   TIA (transient ischemic attack) 06/30/2013   HTN (hypertension) 06/30/2013   Reviewed chart for medication changes ahead of medication coordination call.  No OVs, Consults, or hospital visits since last care coordination call/Pharmacist visit. (If appropriate, list visit date, provider name)   BP Readings from Last 3 Encounters:  05/07/20 138/80  02/11/20 124/68  01/28/20 128/60    Lab Results  Component Value Date   HGBA1C 6.6 (H) 12/17/2019     Patient obtains medications through Adherence Packaging  90 Days   Last adherence delivery included: None-Medication was not needed, she had 1 more adherence packaging box left.   Patient declined the following medications last month:  Hydrochlorothiazide 25 mg- 1 tablet daily (breakfast) Olmesartan 40 mg- 1 tablet daily (breakfast) Pravastatin 80 mg- 1 tablet Monday- Friday, skip weekends(evening meal) Metoprolol Tartrate 50 mg- 1 tablet twice daily (breakfast and evening meal)                         Due to receiving a 90 day supply on 02/28/2020  Patient is due for next adherence delivery on: 04/28/2020. Called patient and reviewed medications and coordinated delivery. 05/25/20-Attempted to call the patient. No answer; left a voicemail.     Follow-Up:  Coordination of Enhanced Pharmacy Services   VOrlando Penner CPP Notified.  CRaynelle Highland CRichlandPharmacist Assistant [phone]

## 2020-05-27 ENCOUNTER — Telehealth: Payer: Self-pay

## 2020-05-27 NOTE — Chronic Care Management (AMB) (Signed)
Chart reviewed  for medication changes ahead of medication coordination call on 05/22/2020. Unsuccessful Outreach to patient by Suezanne Cheshire, CPA.   OV- 05/07/20-Allen, Reino Kent, LPN ([facility]) since last care coordination call/Pharmacist visit.  No medication changes indicated.  BP Readings from Last 3 Encounters:  05/07/20 138/80  02/11/20 124/68  01/28/20 128/60    Lab Results  Component Value Date   HGBA1C 6.6 (H) 12/17/2019     Patient obtains medications through Adherence Packaging  90 Days   Last adherence delivery included: None-Medication was not needed, she had 1 more adherence packaging box left.   Patient declined the following medications last month: Hydrochlorothiazide 25 mg- 1 tablet daily (breakfast) Olmesartan 40 mg- 1 tablet daily (breakfast) Pravastatin 80 mg- 1 tablet Monday- Friday, skip weekends(evening meal) Metoprolol Tartrate 50 mg- 1 tablet twice daily (breakfast and evening meal) Due to receiving a 90 day supply on 02/28/2020  Patient is due for next adherence delivery on: 06/01/2020 . Called patient and reviewed medications and coordinated delivery.  This delivery to include: Hydrochlorothiazide 25 mg- 1 tablet daily (breakfast) Olmesartan 40 mg- 1 tablet daily (breakfast) Pravastatin 80 mg- 1 tablet Monday- Friday, skip weekends(evening meal) Metoprolol Tartrate 50 mg- 1 tablet twice daily (breakfast and evening meal)  No Short fill or acute fill needed.  Patient declined the following medications: None,  due for all medications.   Patient needs refills for- None.  Confirmed delivery date of 06/01/2020, advised patient that pharmacy will contact them the morning of delivery.  Follow-Up: Occupational hygienist and Pharmacist Review. Patient is checking her blood pressures daily, last reading 124/63. Patient is also check her blood sugars daily, last reading 129 fasting am. Patient states she checks all of  her vitals daily including Pulse- 76, Weight- 136 lbs and Temp- 95.8. Patient mentioned she had 1 week left on her medications, she had some left over from previous pharmacy and put in her pill box. Cherylin Mylar, CPP notified.  Billee Cashing, CMA Clinical Pharmacist Assistant [phone]

## 2020-06-23 ENCOUNTER — Telehealth: Payer: Self-pay

## 2020-06-23 NOTE — Chronic Care Management (AMB) (Signed)
Chronic Care Management Pharmacy Assistant   Name: Rebekah Avila  MRN: [id number] DOB: 1932-03-14  Reason for Encounter: Medication Review  Patient Questions:  1.  Have you seen any other providers since your last visit? No    2.  Any changes in your medicines or health? No    PCP : [name], MD  Allergies:   Allergies  Allergen Reactions  . Morphine And Related Rash    Medications: Outpatient Encounter Medications as of 06/23/2020  Medication Sig  . ACCU-CHEK FASTCLIX LANCETS MISC by Does not apply route.  Marland Kitchen acetaminophen (TYLENOL) 500 MG tablet Take 1,000 mg by mouth every 6 (six) hours as needed for moderate pain.  Marland Kitchen aspirin EC 81 MG tablet Take 81 mg by mouth daily.  . Blood Glucose Monitoring Suppl (ACCU-CHEK NANO SMARTVIEW) w/Device KIT 1 strip by Does not apply route.  . Cholecalciferol (VITAMIN D) 2000 units CAPS Take 1 capsule by mouth daily.   . Fish Oil OIL Take 1 capsule by mouth daily. 1024m  . fluticasone (FLONASE) 50 MCG/ACT nasal spray Place 1 spray into both nostrils daily as needed for allergies or rhinitis.  . hydrochlorothiazide (HYDRODIURIL) 25 MG tablet Take 1 tablet (25 mg total) by mouth daily.  . metoprolol tartrate (LOPRESSOR) 50 MG tablet Take 1 tablet (50 mg total) by mouth 2 (two) times daily.  .Marland Kitchenolmesartan (BENICAR) 40 MG tablet Take 1 tablet (40 mg total) by mouth daily.  .Marland Kitchenomeprazole (PRILOSEC OTC) 20 MG tablet Take 20 mg by mouth daily. Bedtime  . pravastatin (PRAVACHOL) 80 MG tablet One tab po M-F, skip weekends   No facility-administered encounter medications on file as of 06/23/2020.    Current Diagnosis: Patient Active Problem List   Diagnosis Date Noted  . Polymyalgia rheumatica ([facility] 08/19/2019  . Type 2 diabetes mellitus with stage 3 chronic kidney disease, without long-term current use of insulin ([place] 04/12/2018  . Nephropathy 08/03/2017  . Hypertensive kidney disease with chronic kidney disease stage III ([place] 08/03/2017   . TIA (transient ischemic attack) 06/30/2013  . HTN (hypertension) 06/30/2013   Reviewed chart for medication changes ahead of medication coordination call.  No OVs, Consults, or hospital visits since last care coordination call/Pharmacist visit. (If appropriate, list visit date, provider name)  No medication changes indicated OR if recent visit, treatment plan here.  BP Readings from Last 3 Encounters:  05/07/20 138/80  02/11/20 124/68  01/28/20 128/60    Lab Results  Component Value Date   HGBA1C 6.6 (H) 12/17/2019   Reviewed chart prior to disease state call. Spoke with patient regarding BP  Recent Office Vitals: BP Readings from Last 3 Encounters:  05/07/20 138/80  02/11/20 124/68  01/28/20 128/60   Pulse Readings from Last 3 Encounters:  05/07/20 76  02/11/20 70  01/28/20 74    Wt Readings from Last 3 Encounters:  05/07/20 138 lb 12.8 oz (63 kg)  02/11/20 138 lb 3.2 oz (62.7 kg)  01/28/20 139 lb (63 kg)     Kidney Function Lab Results  Component Value Date/Time   CREATININE 0.96 12/17/2019 10:08 AM   CREATININE 1.04 (H) 08/19/2019 12:25 PM   GFRNONAA 54 (L) 12/17/2019 10:08 AM   GFRAA 62 12/17/2019 10:08 AM    BMP Latest Ref Rng & Units 12/17/2019 08/19/2019 04/17/2019  Glucose 65 - 99 mg/dL 124(H) 116(H) 109(H)  BUN 8 - 27 mg/dL _0 Creatinine 0.57 - 1.00 mg/dL 0.96 1.04(H) 1.09(H)  BUN/Creat  Ratio 12 - 28 11(L) 13 14  Sodium 134 - 144 mmol/L 141 141 144  Potassium 3.5 - 5.2 mmol/L 3.8 3.9 3.9  Chloride 96 - 106 mmol/L 103 101 102  CO2 20 - 29 mmol/L _0 Calcium 8.7 - 10.3 mg/dL 9.8 9.9 10.1    . Current antihypertensive regimen:  ? Hydrochlorothiazide 86m once daily ? Metoprolol tartrate 523mtwice daily ? Olmesartan 4083mnce daily  . How often are you checking your Blood Pressure? daily   . Current home BP readings: 119/74  . What recent interventions/DTPs have been made by any provider to improve Blood Pressure control since  last CPP Visit:  - Patient reports she is taking her medications as directed.  . Any recent hospitalizations or ED visits since last visit with CPP? No   . What diet changes have been made to improve Blood Pressure Control?  o Patient reports she is still eating healthy and does not use any salt in all her foods.  . What exercise is being done to improve your Blood Pressure Control?  o Patient reports she is very active in the house with house chores and walk up and down the stairs at least 3 or 4 times a day. The patient reports she is still driving to go to the grocery store and to the hair salon.  Adherence Review: Is the patient currently on ACE/ARB medication? Yes Does the patient have >5 day gap between last estimated fill dates? No      Patient obtains medications through Adherence Packaging  90 Days   Last adherence delivery included: Hydrochlorothiazide 25 mg- 1 tablet daily (breakfast) Olmesartan 40 mg- 1 tablet daily (breakfast) Pravastatin 80 mg- 1 tablet Monday- Friday, skip weekends(evening meal) Metoprolol Tartrate 50 mg- 1 tablet twice daily (breakfast and evening meal)  Patient declined the following medications: None, due for all medications.   Patient is due for next adherence delivery on:  06/29/2020. Called patient and reviewed medications and coordinated delivery, patinet's son stated the patient should be good for another month but will need a call back to confirm. Called the patient's son to confirm, unable to hear a ring tone during attempted call. Called to speak with the patient, the patient confirmed she does have adequate supply of all her medications from the last adherence delivery 06/01/2020.  This delivery to include: None  No short fill or acute fill needed.  Patient declined the following medications: Hydrochlorothiazide 25 mg- 1 tablet daily (breakfast) Olmesartan 40 mg- 1 tablet daily (breakfast) Pravastatin 80 mg- 1 tablet Monday-  Friday, skip weekends(evening meal) Metoprolol Tartrate 50 mg- 1 tablet twice daily (breakfast and evening meal)       Due to receiving supply on 06/01/20.   Patient needs refills for-None.  Patient advised we will follow up with her next month to review medication and coordinate delivery.  Follow-Up:  Coordination of Enhanced Pharmacy ServicesPatient reports she is checking her blood pressure daily, last reading was 119/74. Patient is also checking her blood sugars daily, last FBG reading was 123. Patient states she is doing fine, no medication requests and confirmed no recent changes to all of her medications, will contact me if she is needing any future assistance.  ValOrlando PennerPP Notified.  CanRaynelle HighlandMAHammondarmacist Assistant [phone] Total Time: 25 minutes

## 2020-07-20 ENCOUNTER — Encounter: Payer: Self-pay | Admitting: Nurse Practitioner

## 2020-07-20 ENCOUNTER — Other Ambulatory Visit: Payer: Self-pay

## 2020-07-20 ENCOUNTER — Ambulatory Visit (INDEPENDENT_AMBULATORY_CARE_PROVIDER_SITE_OTHER): Payer: Medicare Other | Admitting: Nurse Practitioner

## 2020-07-20 VITALS — BP 144/80 | HR 78 | Temp 98.1°F | Ht 60.8 in | Wt 135.6 lb

## 2020-07-20 DIAGNOSIS — M353 Polymyalgia rheumatica: Secondary | ICD-10-CM

## 2020-07-20 DIAGNOSIS — E1122 Type 2 diabetes mellitus with diabetic chronic kidney disease: Secondary | ICD-10-CM | POA: Diagnosis not present

## 2020-07-20 DIAGNOSIS — Z0000 Encounter for general adult medical examination without abnormal findings: Secondary | ICD-10-CM

## 2020-07-20 DIAGNOSIS — N183 Chronic kidney disease, stage 3 unspecified: Secondary | ICD-10-CM

## 2020-07-20 DIAGNOSIS — I129 Hypertensive chronic kidney disease with stage 1 through stage 4 chronic kidney disease, or unspecified chronic kidney disease: Secondary | ICD-10-CM

## 2020-07-20 DIAGNOSIS — E7800 Pure hypercholesterolemia, unspecified: Secondary | ICD-10-CM | POA: Diagnosis not present

## 2020-07-20 DIAGNOSIS — R413 Other amnesia: Secondary | ICD-10-CM | POA: Diagnosis not present

## 2020-07-20 LAB — POCT URINALYSIS DIPSTICK
Bilirubin, UA: NEGATIVE
Blood, UA: NEGATIVE
Glucose, UA: NEGATIVE
Ketones, UA: NEGATIVE
Nitrite, UA: NEGATIVE
Protein, UA: NEGATIVE
Spec Grav, UA: 1.015 (ref 1.010–1.025)
Urobilinogen, UA: 0.2 E.U./dL
pH, UA: 7 (ref 5.0–8.0)

## 2020-07-20 LAB — POCT UA - MICROALBUMIN
Albumin/Creatinine Ratio, Urine, POC: 30
Creatinine, POC: 200 mg/dL
Microalbumin Ur, POC: 10 mg/L

## 2020-07-20 NOTE — Patient Instructions (Signed)
Health Maintenance, Female Adopting a healthy lifestyle and getting preventive care are important in promoting health and wellness. Ask your health care provider about:  The right schedule for you to have regular tests and exams.  Things you can do on your own to prevent diseases and keep yourself healthy. What should I know about diet, weight, and exercise? Eat a healthy diet  Eat a diet that includes plenty of vegetables, fruits, low-fat dairy products, and lean protein.  Do not eat a lot of foods that are high in solid fats, added sugars, or sodium.   Maintain a healthy weight Body mass index (BMI) is used to identify weight problems. It estimates body fat based on height and weight. Your health care provider can help determine your BMI and help you achieve or maintain a healthy weight. Get regular exercise Get regular exercise. This is one of the most important things you can do for your health. Most adults should:  Exercise for at least 150 minutes each week. The exercise should increase your heart rate and make you sweat (moderate-intensity exercise).  Do strengthening exercises at least twice a week. This is in addition to the moderate-intensity exercise.  Spend less time sitting. Even light physical activity can be beneficial. Watch cholesterol and blood lipids Have your blood tested for lipids and cholesterol at 85 years of age, then have this test every 5 years. Have your cholesterol levels checked more often if:  Your lipid or cholesterol levels are high.  You are older than 85 years of age.  You are at high risk for heart disease. What should I know about cancer screening? Depending on your health history and family history, you may need to have cancer screening at various ages. This may include screening for:  Breast cancer.  Cervical cancer.  Colorectal cancer.  Skin cancer.  Lung cancer. What should I know about heart disease, diabetes, and high blood  pressure? Blood pressure and heart disease  High blood pressure causes heart disease and increases the risk of stroke. This is more likely to develop in people who have high blood pressure readings, are of African descent, or are overweight.  Have your blood pressure checked: ? Every 3-5 years if you are 18-39 years of age. ? Every year if you are 40 years old or older. Diabetes Have regular diabetes screenings. This checks your fasting blood sugar level. Have the screening done:  Once every three years after age 40 if you are at a normal weight and have a low risk for diabetes.  More often and at a younger age if you are overweight or have a high risk for diabetes. What should I know about preventing infection? Hepatitis B If you have a higher risk for hepatitis B, you should be screened for this virus. Talk with your health care provider to find out if you are at risk for hepatitis B infection. Hepatitis C Testing is recommended for:  Everyone born from 1945 through 1965.  Anyone with known risk factors for hepatitis C. Sexually transmitted infections (STIs)  Get screened for STIs, including gonorrhea and chlamydia, if: ? You are sexually active and are younger than 85 years of age. ? You are older than 85 years of age and your health care provider tells you that you are at risk for this type of infection. ? Your sexual activity has changed since you were last screened, and you are at increased risk for chlamydia or gonorrhea. Ask your health care provider   if you are at risk.  Ask your health care provider about whether you are at high risk for HIV. Your health care provider may recommend a prescription medicine to help prevent HIV infection. If you choose to take medicine to prevent HIV, you should first get tested for HIV. You should then be tested every 3 months for as long as you are taking the medicine. Pregnancy  If you are about to stop having your period (premenopausal) and  you may become pregnant, seek counseling before you get pregnant.  Take 400 to 800 micrograms (mcg) of folic acid every day if you become pregnant.  Ask for birth control (contraception) if you want to prevent pregnancy. Osteoporosis and menopause Osteoporosis is a disease in which the bones lose minerals and strength with aging. This can result in bone fractures. If you are 65 years old or older, or if you are at risk for osteoporosis and fractures, ask your health care provider if you should:  Be screened for bone loss.  Take a calcium or vitamin D supplement to lower your risk of fractures.  Be given hormone replacement therapy (HRT) to treat symptoms of menopause. Follow these instructions at home: Lifestyle  Do not use any products that contain nicotine or tobacco, such as cigarettes, e-cigarettes, and chewing tobacco. If you need help quitting, ask your health care provider.  Do not use street drugs.  Do not share needles.  Ask your health care provider for help if you need support or information about quitting drugs. Alcohol use  Do not drink alcohol if: ? Your health care provider tells you not to drink. ? You are pregnant, may be pregnant, or are planning to become pregnant.  If you drink alcohol: ? Limit how much you use to 0-1 drink a day. ? Limit intake if you are breastfeeding.  Be aware of how much alcohol is in your drink. In the [place] one drink equals one 12 oz bottle of beer (355 mL), one 5 oz glass of wine (148 mL), or one 1 oz glass of hard liquor (44 mL). General instructions  Schedule regular health, dental, and eye exams.  Stay current with your vaccines.  Tell your health care provider if: ? You often feel depressed. ? You have ever been abused or do not feel safe at home. Summary  Adopting a healthy lifestyle and getting preventive care are important in promoting health and wellness.  Follow your health care provider's instructions about healthy  diet, exercising, and getting tested or screened for diseases.  Follow your health care provider's instructions on monitoring your cholesterol and blood pressure. This information is not intended to replace advice given to you by your health care provider. Make sure you discuss any questions you have with your health care provider. Document Revised: 04/04/2018 Document Reviewed: 04/04/2018 Elsevier Patient Education  2021 Elsevier Inc.  

## 2020-07-20 NOTE — Progress Notes (Signed)
I,Rebekah Avila as a Education administrator for [employer], Rebekah Avila.,have documented all relevant documentation on the behalf of Rebekah Avila, Rebekah Avila,as directed by  Rebekah Avila, Rebekah Avila while in the presence of Rebekah Avila, [place]. This visit occurred during the SARS-CoV-2 public health emergency.  Safety protocols were in place, including screening questions prior to the visit, additional usage of staff PPE, and extensive cleaning of exam room while observing appropriate contact time as indicated for disinfecting solutions.  Subjective:     Patient ID: Rebekah Avila , female    DOB: 08-29-1931 , 87 y.o.   MRN: [id number]   Chief Complaint  Patient presents with  . Annual Exam    HPI  Patient here for her physical  Wt Readings from Last 3 Encounters: 07/20/20 : 135 lb 9.6 oz (61.5 kg) 05/07/20 : 138 lb 12.8 oz (63 kg) 02/11/20 : 138 lb 3.2 oz (62.7 kg) Diabetes     Past Medical History:  Diagnosis Date  . Diabetes ([place])   . High cholesterol   . Hypertension   . Polymyalgia rheumatica (HCC)      Family History  Problem Relation Age of Onset  . Healthy Mother   . Heart attack Father      Current Outpatient Medications:  .  ACCU-CHEK FASTCLIX LANCETS MISC, by Does not apply route., Disp: , Rfl:  .  acetaminophen (TYLENOL) 500 MG tablet, Take 1,000 mg by mouth every 6 (six) hours as needed for moderate pain., Disp: , Rfl:  .  aspirin EC 81 MG tablet, Take 81 mg by mouth daily., Disp: , Rfl:  .  Blood Glucose Monitoring Suppl (ACCU-CHEK NANO SMARTVIEW) w/Device KIT, 1 strip by Does not apply route., Disp: , Rfl:  .  Cholecalciferol (VITAMIN D) 2000 units CAPS, Take 1 capsule by mouth daily. , Disp: , Rfl:  .  Fish Oil OIL, Take 1 capsule by mouth daily. 1061m, Disp: , Rfl:  .  fluticasone (FLONASE) 50 MCG/ACT nasal spray, Place 1 spray into both nostrils daily as needed for allergies or rhinitis., Disp: , Rfl:  .  hydrochlorothiazide (HYDRODIURIL) 25 MG tablet, Take 1 tablet (25 mg  total) by mouth daily., Disp: 90 tablet, Rfl: 2 .  metoprolol tartrate (LOPRESSOR) 50 MG tablet, Take 1 tablet (50 mg total) by mouth 2 (two) times daily., Disp: 180 tablet, Rfl: 2 .  olmesartan (BENICAR) 40 MG tablet, Take 1 tablet (40 mg total) by mouth daily., Disp: 90 tablet, Rfl: 2 .  omeprazole (PRILOSEC OTC) 20 MG tablet, Take 20 mg by mouth daily. Bedtime, Disp: , Rfl:  .  pravastatin (PRAVACHOL) 80 MG tablet, One tab po M-F, skip weekends, Disp: 75 tablet, Rfl: 2   Allergies  Allergen Reactions  . Morphine And Related Rash      The patient states she uses status post hysterectomy for birth control.  No LMP recorded. Patient is postmenopausal.. Negative for Dysmenorrhea and Negative for Menorrhagia. Negative for: breast discharge, breast lump(s), breast pain and breast self exam. Associated symptoms include abnormal vaginal bleeding. Pertinent negatives include abnormal bleeding (hematology), anxiety, decreased libido, depression, difficulty falling sleep, dyspareunia, history of infertility, nocturia, sexual dysfunction, sleep disturbances, urinary incontinence, urinary urgency, vaginal discharge and vaginal itching. Diet regular; she does try to watch what she eats. She does like sweets. The patient states her exercise level is moderate - she will do arm exercises and going up and down her steps throughout the day.    The patient's tobacco use is:  Social History   Tobacco Use  Smoking Status Never Smoker  Smokeless Tobacco Never Used   She has been exposed to passive smoke. The patient's alcohol use is:  Social History   Substance and Sexual Activity  Alcohol Use No    Review of Systems  Constitutional: Negative.   HENT: Negative.   Eyes: Negative.   Respiratory: Negative.   Cardiovascular: Negative.   Gastrointestinal: Negative.   Endocrine: Negative.   Genitourinary: Negative.   Musculoskeletal: Negative.   Skin: Negative.   Allergic/Immunologic: Negative.    Neurological: Negative.   Hematological: Negative.   Psychiatric/Behavioral: Negative.        She feels like she is unable to remember information as well     Today's Vitals   07/20/20 1120  BP: (!) 144/80  Pulse: 78  Temp: 98.1 F (36.7 C)  TempSrc: Oral  Weight: 135 lb 9.6 oz (61.5 kg)  Height: 5' 0.8" (1.544 m)  PainSc: 0-No pain   Body mass index is 25.79 kg/m.   Objective:  Physical Exam Constitutional:      General: She is not in acute distress.    Appearance: Normal appearance. She is well-developed. She is obese.  HENT:     Head: Normocephalic and atraumatic.     Right Ear: Hearing, tympanic membrane, ear canal and external ear normal. There is no impacted cerumen.     Left Ear: Hearing, tympanic membrane, ear canal and external ear normal. There is no impacted cerumen.     Nose:     Comments: Deferred - masked    Mouth/Throat:     Comments: Deferred - masked Eyes:     General: Lids are normal.     Extraocular Movements: Extraocular movements intact.     Conjunctiva/sclera: Conjunctivae normal.     Pupils: Pupils are equal, round, and reactive to light.     Funduscopic exam:    Right eye: No papilledema.        Left eye: No papilledema.  Neck:     Thyroid: No thyroid mass.     Vascular: No carotid bruit.  Cardiovascular:     Rate and Rhythm: Normal rate and regular rhythm.     Pulses: Normal pulses.     Heart sounds: Normal heart sounds. No murmur heard.   Pulmonary:     Effort: Pulmonary effort is normal. No respiratory distress.     Breath sounds: Normal breath sounds. No wheezing.  Chest:     Chest wall: No mass.  Breasts:     Tanner Score is 5.     Right: Normal. No mass, tenderness, axillary adenopathy or supraclavicular adenopathy.     Left: Normal. No mass, tenderness, axillary adenopathy or supraclavicular adenopathy.    Abdominal:     General: Abdomen is flat. Bowel sounds are normal. There is no distension.     Palpations: Abdomen is  soft.     Tenderness: There is no abdominal tenderness.  Genitourinary:    Rectum: Guaiac result negative.  Musculoskeletal:        General: No swelling. Normal range of motion.     Cervical back: Full passive range of motion without pain, normal range of motion and neck supple.     Right lower leg: No edema.     Left lower leg: No edema.  Lymphadenopathy:     Upper Body:     Right upper body: No supraclavicular, axillary or pectoral adenopathy.     Left upper body: No supraclavicular,  axillary or pectoral adenopathy.  Skin:    General: Skin is warm and dry.     Capillary Refill: Capillary refill takes less than 2 seconds.  Neurological:     General: No focal deficit present.     Mental Status: She is alert and oriented to person, place, and time.     Cranial Nerves: No cranial nerve deficit.     Sensory: No sensory deficit.     Motor: No weakness.  Psychiatric:        Mood and Affect: Mood normal.        Behavior: Behavior normal.        Thought Content: Thought content normal.        Judgment: Judgment normal.         Assessment And Plan:     1. Encounter for general adult medical examination w/o abnormal findings . Behavior modifications discussed and diet history reviewed.   . Pt will continue to exercise regularly and modify diet with low GI, plant based foods and decrease intake of processed foods.  . Recommend intake of daily multivitamin, Vitamin D, and calcium.  . Recommend mammogram and colonoscopy (out aged) for preventive screenings, as well as recommend immunizations that include influenza, TDAP  2. Type 2 diabetes mellitus with stage 3 chronic kidney disease, without long-term current use of insulin, unspecified whether stage 3a or 3b CKD (HCC)  Chronic, stable.  Continue with current medications - POCT Urinalysis Dipstick (81002) - POCT UA - Microalbumin - CMP14+EGFR - CBC - Hemoglobin A1c  3. Pure hypercholesterolemia  Chronic,  controlled  Continue with current medications, tolerating well - Lipid panel - CMP14+EGFR  4. Hypertensive nephropathy  Chronic, blood pressure is slightly elevated  Continue with current medications  EKG done with NSR HR 77  - EKG 12-Lead  5. Polymyalgia rheumatica (HCC) Chronic, stable  6. Memory changes  Will check for metabolic causes  Her 6CIT in January was 13 up from 8, pending labs will consider starting a medication for her memory - TSH - Vitamin B12 - T pallidum Screening [place]     Patient was given opportunity to ask questions. Patient verbalized understanding of the plan and was able to repeat key elements of the plan. All questions were answered to their satisfaction.   Rebekah Avila, Rebekah Avila   I, Rebekah Avila, Rebekah Avila, have reviewed all documentation for this visit. The documentation on 07/20/20 for the exam, diagnosis, procedures, and orders are all accurate and complete.   THE PATIENT IS ENCOURAGED TO PRACTICE SOCIAL DISTANCING DUE TO THE COVID-19 PANDEMIC.

## 2020-07-21 LAB — CMP14+EGFR
ALT: 15 IU/L (ref 0–32)
AST: 30 IU/L (ref 0–40)
Albumin/Globulin Ratio: 1.2 (ref 1.2–2.2)
Albumin: 4.4 g/dL (ref 3.6–4.6)
Alkaline Phosphatase: 59 IU/L (ref 44–121)
BUN/Creatinine Ratio: 14 (ref 12–28)
BUN: 16 mg/dL (ref 8–27)
Bilirubin Total: 0.6 mg/dL (ref 0.0–1.2)
CO2: 25 mmol/L (ref 20–29)
Calcium: 9.5 mg/dL (ref 8.7–10.3)
Chloride: 102 mmol/L (ref 96–106)
Creatinine, Ser: 1.14 mg/dL — ABNORMAL HIGH (ref 0.57–1.00)
Globulin, Total: 3.6 g/dL (ref 1.5–4.5)
Glucose: 112 mg/dL — ABNORMAL HIGH (ref 65–99)
Potassium: 3.7 mmol/L (ref 3.5–5.2)
Sodium: 141 mmol/L (ref 134–144)
Total Protein: 8 g/dL (ref 6.0–8.5)
eGFR: 47 mL/min/{1.73_m2} — ABNORMAL LOW (ref >59)

## 2020-07-21 LAB — LIPID PANEL
Chol/HDL Ratio: 4.1 ratio (ref 0.0–4.4)
Cholesterol, Total: 180 mg/dL (ref 100–199)
HDL: 44 mg/dL (ref >39)
LDL Chol Calc (NIH): 112 mg/dL — ABNORMAL HIGH (ref 0–99)
Triglycerides: 134 mg/dL (ref 0–149)
VLDL Cholesterol Cal: 24 mg/dL (ref 5–40)

## 2020-07-21 LAB — CBC
Hematocrit: 33.2 % — ABNORMAL LOW (ref 34.0–46.6)
Hemoglobin: 11.2 g/dL (ref 11.1–15.9)
MCH: 30.2 pg (ref 26.6–33.0)
MCHC: 33.7 g/dL (ref 31.5–35.7)
MCV: 90 fL (ref 79–97)
Platelets: 261 10*3/uL (ref 150–450)
RBC: 3.71 x10E6/uL — ABNORMAL LOW (ref 3.77–5.28)
RDW: 12.2 % (ref 11.7–15.4)
WBC: 4.8 10*3/uL (ref 3.4–10.8)

## 2020-07-21 LAB — VITAMIN B12: Vitamin B-12: 514 pg/mL (ref 232–1245)

## 2020-07-21 LAB — HEMOGLOBIN A1C
Est. average glucose Bld gHb Est-mCnc: 148 mg/dL
Hgb A1c MFr Bld: 6.8 % — ABNORMAL HIGH (ref 4.8–5.6)

## 2020-07-21 LAB — T PALLIDUM SCREENING CASCADE: T pallidum Antibodies (TP-PA): NONREACTIVE

## 2020-07-21 LAB — TSH: TSH: 2.51 u[IU]/mL (ref 0.450–4.500)

## 2020-07-28 ENCOUNTER — Telehealth: Payer: Self-pay

## 2020-07-28 NOTE — Chronic Care Management (AMB) (Signed)
Chronic Care Management Pharmacy Assistant   Name: BRAEDEN DOLINSKI  MRN: [id number] DOB: Sep 23, 1931  Reason for Encounter: Medication Review/ Medication Coordination Call   Recent office visits:  07/20/2020- [name], FNP (PCP)  Recent consult visits:  None  Hospital visits:  None in previous 6 months   Reviewed chart for medication changes ahead of medication coordination call.  No medication changes indicated.  BP Readings from Last 3 Encounters:  07/20/20 (!) 144/80  05/07/20 138/80  02/11/20 124/68    Lab Results  Component Value Date   HGBA1C 6.8 (H) 07/20/2020     Patient obtains medications through Adherence Packaging  90 Days   Last adherence delivery included: None   Patient declined the following medications last month:  Hydrochlorothiazide 25 mg- 1 tablet daily (breakfast) Olmesartan 40 mg- 1 tablet daily (breakfast) Pravastatin 80 mg- 1 tablet Monday- Friday, skip weekends(evening meal) Metoprolol Tartrate 50 mg- 1 tablet twice daily (breakfast and evening meal)                  Due to receiving supply on 06/01/20.  Patient is due for next adherence delivery on: 07/28/2020. Called patient and reviewed medications and coordinated delivery.  This delivery to include: None  No short fill or acute fill needed.  Patient declined the following medications: Hydrochlorothiazide 25 mg- 1 tablet daily (breakfast) Olmesartan 40 mg- 1 tablet daily (breakfast) Pravastatin 80 mg- 1 tablet Monday- Friday, skip weekends(evening meal) Metoprolol Tartrate 50 mg- 1 tablet twice daily (breakfast and evening meal)   Patient needs refills for: None.  Patient aware I will follow up with her next month to review medication and assess and medication needs.  Medications: Outpatient Encounter Medications as of 07/28/2020  Medication Sig  . ACCU-CHEK FASTCLIX LANCETS MISC by Does not apply route.  Marland Kitchen acetaminophen (TYLENOL) 500 MG tablet Take 1,000 mg by mouth every 6  (six) hours as needed for moderate pain.  Marland Kitchen aspirin EC 81 MG tablet Take 81 mg by mouth daily.  . Blood Glucose Monitoring Suppl (ACCU-CHEK NANO SMARTVIEW) w/Device KIT 1 strip by Does not apply route.  . Cholecalciferol (VITAMIN D) 2000 units CAPS Take 1 capsule by mouth daily.   . Fish Oil OIL Take 1 capsule by mouth daily. 1063m  . fluticasone (FLONASE) 50 MCG/ACT nasal spray Place 1 spray into both nostrils daily as needed for allergies or rhinitis.  . hydrochlorothiazide (HYDRODIURIL) 25 MG tablet Take 1 tablet (25 mg total) by mouth daily.  . metoprolol tartrate (LOPRESSOR) 50 MG tablet Take 1 tablet (50 mg total) by mouth 2 (two) times daily.  .Marland Kitchenolmesartan (BENICAR) 40 MG tablet Take 1 tablet (40 mg total) by mouth daily.  .Marland Kitchenomeprazole (PRILOSEC OTC) 20 MG tablet Take 20 mg by mouth daily. Bedtime  . pravastatin (PRAVACHOL) 80 MG tablet One tab po M-F, skip weekends   No facility-administered encounter medications on file as of 07/28/2020.    Star Rating Drugs: Olmesartan 40 mg- Last filled 06/01/2020 for 90 day supply at [facility] Pravastatin 80 mg- Last filled 06/01/2020 for 90 say supply at [facility].    Notes: Patient states blood sugars are doing well, range from 112- 110, Patient states back in March she had a reading of 130 but she had some candy that day. Patient is checking blood pressures at home; she did not have her bp log on hand but just gave readings to PCP at 07/20/2020. Last reading she remembered was 140/80.  DTPs: Patient admits to eating 4 pieces of [employer]andy that totals 27 g of sugar once and a while. She started drinking water after she eats candy, patient is aware her A1C has increased. Patient states she is doing better with sugar intake and water intake.    SIG: [name], [place] Pharmacist Assistant [phone]

## 2020-08-20 ENCOUNTER — Other Ambulatory Visit: Payer: Self-pay

## 2020-08-20 MED ORDER — PRAVASTATIN SODIUM 80 MG PO TABS: ORAL_TABLET | ORAL | 2 refills | Status: DC

## 2020-08-20 MED ORDER — HYDROCHLOROTHIAZIDE 25 MG PO TABS: 25.0000 mg | ORAL_TABLET | Freq: Every day | ORAL | 2 refills | Status: DC

## 2020-08-20 MED ORDER — METOPROLOL TARTRATE 50 MG PO TABS: 50.0000 mg | ORAL_TABLET | Freq: Two times a day (BID) | ORAL | 2 refills | Status: DC

## 2020-08-20 MED ORDER — OLMESARTAN MEDOXOMIL 40 MG PO TABS: 40.0000 mg | ORAL_TABLET | Freq: Every day | ORAL | 2 refills | Status: DC

## 2020-08-24 ENCOUNTER — Other Ambulatory Visit: Payer: Self-pay

## 2020-08-24 ENCOUNTER — Telehealth: Payer: Self-pay

## 2020-08-24 MED ORDER — OLMESARTAN MEDOXOMIL 40 MG PO TABS: 40.0000 mg | ORAL_TABLET | Freq: Every day | ORAL | 2 refills | Status: DC

## 2020-08-24 MED ORDER — METOPROLOL TARTRATE 50 MG PO TABS: 50.0000 mg | ORAL_TABLET | Freq: Two times a day (BID) | ORAL | 2 refills | Status: DC

## 2020-08-24 MED ORDER — HYDROCHLOROTHIAZIDE 25 MG PO TABS: 25.0000 mg | ORAL_TABLET | Freq: Every day | ORAL | 2 refills | Status: DC

## 2020-08-24 MED ORDER — PRAVASTATIN SODIUM 80 MG PO TABS: ORAL_TABLET | ORAL | 2 refills | Status: DC

## 2020-08-24 NOTE — Chronic Care Management (AMB) (Addendum)
Chronic Care Management Pharmacy Assistant   Name: Rebekah Avila  MRN: [id number] DOB: 03/23/1932  Reason for Encounter: Medication Review/ Medication coordination   Recent office visits:  05-06-2020 [name], LPN   [id number] [name], FNP    Recent consult visits:  None  Hospital visits:  None in previous 6 months  Medications: Outpatient Encounter Medications as of 08/24/2020  Medication Sig   ACCU-CHEK FASTCLIX LANCETS MISC by Does not apply route.   acetaminophen (TYLENOL) 500 MG tablet Take 1,000 mg by mouth every 6 (six) hours as needed for moderate pain.   aspirin EC 81 MG tablet Take 81 mg by mouth daily.   Blood Glucose Monitoring Suppl (ACCU-CHEK NANO SMARTVIEW) w/Device KIT 1 strip by Does not apply route.   Cholecalciferol (VITAMIN D) 2000 units CAPS Take 1 capsule by mouth daily.    Fish Oil OIL Take 1 capsule by mouth daily. 1038m   fluticasone (FLONASE) 50 MCG/ACT nasal spray Place 1 spray into both nostrils daily as needed for allergies or rhinitis.   hydrochlorothiazide (HYDRODIURIL) 25 MG tablet Take 1 tablet (25 mg total) by mouth daily.   metoprolol tartrate (LOPRESSOR) 50 MG tablet Take 1 tablet (50 mg total) by mouth 2 (two) times daily.   olmesartan (BENICAR) 40 MG tablet Take 1 tablet (40 mg total) by mouth daily.   omeprazole (PRILOSEC OTC) 20 MG tablet Take 20 mg by mouth daily. Bedtime   pravastatin (PRAVACHOL) 80 MG tablet One tab po M-F, skip weekends   No facility-administered encounter medications on file as of 08/24/2020.   Reviewed chart for medication changes ahead of medication coordination call.  No medication changes indicated.  BP Readings from Last 3 Encounters:  07/20/20 (!) 144/80  05/07/20 138/80  02/11/20 124/68    Lab Results  Component Value Date   HGBA1C 6.8 (H) 07/20/2020     Patient obtains medications through Adherence Packaging  90 Days   Last adherence delivery included: None  Patient declined  the following medications last month: Hydrochlorothiazide 25 mg- 1 tablet daily (breakfast) Olmesartan 40 mg- 1 tablet daily (breakfast) Pravastatin 80 mg- 1 tablet Monday- Friday, skip weekends(evening meal) Metoprolol Tartrate 50 mg- 1 tablet twice daily (breakfast and evening meal)   Due to receiving 90DS 06-01-2020   Patient is due for next adherence delivery on: 08-28-2020 Called patient and reviewed medications and coordinated delivery.  This delivery to include: Hydrochlorothiazide 25 mg- 1 tablet daily (breakfast) Olmesartan 40 mg- 1 tablet daily (breakfast) Pravastatin 80 mg- 1 tablet Monday- Friday, skip weekends(evening meal) Metoprolol Tartrate 50 mg- 1 tablet twice daily (breakfast and evening meal)  No short fill or acute fill needed.  Patient did not decline any medications.  Patient needs refills for: Pravastatin 80 mg Hctz 25 mg Olmesartan 40 mg Metoprolol tart 50 mg  Request sent to PCP office for refills.  Confirmed delivery date of  08-28-2020, advised patient that pharmacy will contact them the morning of delivery.  NOTE: Patient reports taking blood pressure and blood sugars daily. Two recent blood pressures were 123/65, 130/62. Recent blood sugar reading was 114.  Star Rating Drugs: Olmesartan 40 mg- [facility]/2020 [facility] say [facility] [phone] I have reviewed the care management and care coordination activities outlined in this encounter and I am certifying that I agree with the  content of this note. No further action required. Total Time 2 minutes  Mayford Knife, [facility] 08/25/20 9:21 AM

## 2020-10-07 ENCOUNTER — Telehealth: Payer: Self-pay

## 2020-10-07 NOTE — Chronic Care Management (AMB) (Signed)
Chronic Care Management Pharmacy Assistant   Name: Rebekah Avila  MRN: [id number] DOB: 10/26/1931  Reason for Encounter: Disease State/ Hypertension  Recent office visits:  None  Recent consult visits:  None  Hospital visits:  None in previous 6 months  Medications: Outpatient Encounter Medications as of 10/07/2020  Medication Sig   ACCU-CHEK FASTCLIX LANCETS MISC by Does not apply route.   acetaminophen (TYLENOL) 500 MG tablet Take 1,000 mg by mouth every 6 (six) hours as needed for moderate pain.   aspirin EC 81 MG tablet Take 81 mg by mouth daily.   Blood Glucose Monitoring Suppl (ACCU-CHEK NANO SMARTVIEW) w/Device KIT 1 strip by Does not apply route.   Cholecalciferol (VITAMIN D) 2000 units CAPS Take 1 capsule by mouth daily.    Fish Oil OIL Take 1 capsule by mouth daily. 1062m   fluticasone (FLONASE) 50 MCG/ACT nasal spray Place 1 spray into both nostrils daily as needed for allergies or rhinitis.   hydrochlorothiazide (HYDRODIURIL) 25 MG tablet Take 1 tablet (25 mg total) by mouth daily.   metoprolol tartrate (LOPRESSOR) 50 MG tablet Take 1 tablet (50 mg total) by mouth 2 (two) times daily.   olmesartan (BENICAR) 40 MG tablet Take 1 tablet (40 mg total) by mouth daily.   omeprazole (PRILOSEC OTC) 20 MG tablet Take 20 mg by mouth daily. Bedtime   pravastatin (PRAVACHOL) 80 MG tablet One tab po M-F, skip weekends   No facility-administered encounter medications on file as of 10/07/2020.   Reviewed chart prior to disease state call. Spoke with patient regarding BP  Recent Office Vitals: BP Readings from Last 3 Encounters:  07/20/20 (!) 144/80  05/07/20 138/80  02/11/20 124/68   Pulse Readings from Last 3 Encounters:  07/20/20 78  05/07/20 76  02/11/20 70    Wt Readings from Last 3 Encounters:  07/20/20 135 lb 9.6 oz (61.5 kg)  05/07/20 138 lb 12.8 oz (63 kg)  02/11/20 138 lb 3.2 oz (62.7 kg)     Kidney Function Lab Results  Component Value Date/Time    CREATININE 1.14 (H) 07/20/2020 02:24 PM   CREATININE 0.96 12/17/2019 10:08 AM   GFRNONAA 54 (L) 12/17/2019 10:08 AM   GFRAA 62 12/17/2019 10:08 AM    BMP Latest Ref Rng & Units 07/20/2020 12/17/2019 08/19/2019  Glucose 65 - 99 mg/dL 112(H) 124(H) 116(H)  BUN 8 - 27 mg/dL _0 Creatinine 0.57 - 1.00 mg/dL 1.14(H) 0.96 1.04(H)  BUN/Creat Ratio 12 - 28 14 11(L) 13  Sodium 134 - 144 mmol/L 141 141 141  Potassium 3.5 - 5.2 mmol/L 3.7 3.8 3.9  Chloride 96 - 106 mmol/L 102 103 101  CO2 20 - 29 mmol/L _1 Calcium 8.7 - 10.3 mg/dL 9.5 9.8 9.9    Current antihypertensive regimen:  Olmesartan 40 mg daily HCTZ 25 mg daily Metoprolol 50 mg twice daily  How often are you checking your Blood Pressure? daily  Current home BP readings: 117/63, 118/70, 123/65  What recent interventions/DTPs have been made by any provider to improve Blood Pressure control since last CPP Visit: Patient states she checks blood pressure daily and takes medications as directed.  Any recent hospitalizations or ED visits since last visit with CPP? No  What diet changes have been made to improve Blood Pressure Control?  Patient states she doesn't have a big appetite. Patient states for breakfast she eats cereal with a slice of cheese toast, bacon or  sausage with  fruit juice. Patient states she doesn't drink coffee. Patient states she may have some fruit for lunch and her dinner varies. Patient stated she loves vegetables.  What exercise is being done to improve your Blood Pressure Control?  Patient states she does house work daily, yard work and walks up and down stairs 3-4 times daily.  Adherence Review: Is the patient currently on ACE/ARB medication? Yes Does the patient have >5 day gap between last estimated fill dates? No  NOTES: Patient states she feels she is managing her blood pressure well. Sent scheduling a message to schedule patient on 11-17-2020 at 3:00 with Orlando Penner CPP.  Star Rating  Drugs: Olmesartan 40 mg- Last filled 08-28-2020 [facility] Pravastatin 80 mg- Last filled 08-28-2020 [facility]   [place] Clinical Pharmacist Assistant [phone]

## 2020-11-16 ENCOUNTER — Telehealth: Payer: Self-pay

## 2020-11-16 NOTE — Chronic Care Management (AMB) (Addendum)
Chronic Care Management Pharmacy Assistant   Name: Rebekah Avila  MRN: [id number] DOB: Aug 21, 1931   Reason for Encounter: Medication Review/ Medication coordination,    Recent office visits:  None  Recent consult visits:  None  Hospital visits:  None in previous 6 months  Medications: Outpatient Encounter Medications as of 11/16/2020  Medication Sig   ACCU-CHEK FASTCLIX LANCETS MISC by Does not apply route.   acetaminophen (TYLENOL) 500 MG tablet Take 1,000 mg by mouth every 6 (six) hours as needed for moderate pain.   aspirin EC 81 MG tablet Take 81 mg by mouth daily.   Blood Glucose Monitoring Suppl (ACCU-CHEK NANO SMARTVIEW) w/Device KIT 1 strip by Does not apply route.   Cholecalciferol (VITAMIN D) 2000 units CAPS Take 1 capsule by mouth daily.    Fish Oil OIL Take 1 capsule by mouth daily. 1070m   fluticasone (FLONASE) 50 MCG/ACT nasal spray Place 1 spray into both nostrils daily as needed for allergies or rhinitis.   hydrochlorothiazide (HYDRODIURIL) 25 MG tablet Take 1 tablet (25 mg total) by mouth daily.   metoprolol tartrate (LOPRESSOR) 50 MG tablet Take 1 tablet (50 mg total) by mouth 2 (two) times daily.   olmesartan (BENICAR) 40 MG tablet Take 1 tablet (40 mg total) by mouth daily.   omeprazole (PRILOSEC OTC) 20 MG tablet Take 20 mg by mouth daily. Bedtime   pravastatin (PRAVACHOL) 80 MG tablet One tab po M-F, skip weekends   No facility-administered encounter medications on file as of 11/16/2020.   Reviewed chart for medication changes ahead of medication coordination call.  No OVs, Consults, or hospital visits since last care coordination call/Pharmacist visit. (If appropriate, list visit date, provider name)  No medication changes indicated OR if recent visit, treatment plan here.  BP Readings from Last 3 Encounters:  07/20/20 (!) 144/80  05/07/20 138/80  02/11/20 124/68    Lab Results  Component Value Date   HGBA1C 6.8 (H) 07/20/2020    Reviewed  chart prior to disease state call. Spoke with patient regarding BP  Recent Office Vitals: BP Readings from Last 3 Encounters:  07/20/20 (!) 144/80  05/07/20 138/80  02/11/20 124/68   Pulse Readings from Last 3 Encounters:  07/20/20 78  05/07/20 76  02/11/20 70    Wt Readings from Last 3 Encounters:  07/20/20 135 lb 9.6 oz (61.5 kg)  05/07/20 138 lb 12.8 oz (63 kg)  02/11/20 138 lb 3.2 oz (62.7 kg)     Kidney Function Lab Results  Component Value Date/Time   CREATININE 1.14 (H) 07/20/2020 02:24 PM   CREATININE 0.96 12/17/2019 10:08 AM   GFRNONAA 54 (L) 12/17/2019 10:08 AM   GFRAA 62 12/17/2019 10:08 AM    BMP Latest Ref Rng & Units 07/20/2020 12/17/2019 08/19/2019  Glucose 65 - 99 mg/dL 112(H) 124(H) 116(H)  BUN 8 - 27 mg/dL '16 11 13  ' Creatinine 0.57 - 1.00 mg/dL 1.14(H) 0.96 1.04(H)  BUN/Creat Ratio 12 - 28 14 11(L) 13  Sodium 134 - 144 mmol/L 141 141 141  Potassium 3.5 - 5.2 mmol/L 3.7 3.8 3.9  Chloride 96 - 106 mmol/L 102 103 101  CO2 20 - 29 mmol/L '25 24 25  ' Calcium 8.7 - 10.3 mg/dL 9.5 9.8 9.9    Current antihypertensive regimen:  Hydrochlorothiazide 25 mg daily Metoprolol tartrate 563mtwice daily Olmesartan 4024maily  How often are you checking your Blood Pressure? daily  Current home BP readings: 115/70  What recent interventions/DTPs have  been made by any provider to improve Blood Pressure control since last CPP Visit: Patient states she is taking medications as directed.  Any recent hospitalizations or ED visits since last visit with CPP? No  What diet changes have been made to improve Blood Pressure Control?  Patient states she doesn't use salt and she drinks plenty of water. Patient states she eats plenty of vegetables and fruits but may have a piece of candy few days out of the week. What exercise is being done to improve your Blood Pressure Control? Patient states she is very active during the day with house work and goes up and down stairs. Patient  states she does the same stretch routine she did while in physical therapy.  Adherence Review: Is the patient currently on ACE/ARB medication? Yes Does the patient have >5 day gap between last estimated fill dates? No    Patient obtains medications through Adherence Packaging  90 Days   Last adherence delivery included: None  Patient declined the following medications last month: Hydrochlorothiazide 25 mg- 1 tablet daily (breakfast) Olmesartan 40 mg- 1 tablet daily (breakfast) Pravastatin 80 mg- 1 tablet Monday- Friday, skip weekends(evening meal) Metoprolol Tartrate 50 mg- 1 tablet twice daily (breakfast and evening meal)   Due to receiving 90 day supply on 08-28-2020  Patient is due for next adherence delivery on: 11-24-2020  Called patient and reviewed medications and coordinated delivery.  This delivery to include: Hydrochlorothiazide 25 mg- 1 tablet daily (breakfast) Olmesartan 40 mg- 1 tablet daily (breakfast) Pravastatin 80 mg- 1 tablet Monday- Friday, skip weekends(evening meal) Metoprolol Tartrate 50 mg- 1 tablet twice daily (breakfast and evening meal)  No acute or short fill needed  Patient declined the following medications: None  No refills needed  Confirmed delivery date of 11-24-2020, advised patient that pharmacy will contact them the morning of delivery.   11-17-2020: Called to reschedule today's telephone appointment and completed assessment per Orlando Penner CPP. Patient states blood pressure and blood sugars are good. Recent fasting blood sugars 134,135,129,123,142,140,125.  11-16-2020: Patient aware of telephone appointment with Orlando Penner CPP on 11-17-2020 at 3:00.Patient aware to have/bring all medications, supplements, blood pressure and/or blood sugar logs to visit.  Questions: Have you had any recent office visit or specialist visit outside of [place]? Patient stated no  Are there any concerns you would like to discuss during  your office visit? Patient stated no and she is very impressed with her health right now.  Are you having any problems obtaining your medications? (Whether it pharmacy issues or cost) Patient stated no  If patient has any PAP medications ask if they are having any problems getting their PAP medication or refill? None   Any gaps in medications fill history? No    Care Gaps: Shingrix overdue Tdap overdue  Yearly foot exam overdue Covid booster overdue RAF=1.416% Last medicare wellness 05-07-2020  Star Rating Drugs: Olmesartan 40 mg- Last filled 08-28-2020 [facility] Pravastatin 80 mg- Last filled 08-28-2020 [facility]  [place] Clinical Pharmacist Assistant [phone]

## 2020-11-17 ENCOUNTER — Telehealth: Payer: Medicare Other

## 2020-11-19 ENCOUNTER — Ambulatory Visit: Payer: Medicare Other | Admitting: Nurse Practitioner

## 2020-12-29 ENCOUNTER — Telehealth: Payer: Self-pay

## 2020-12-29 NOTE — Chronic Care Management (AMB) (Signed)
  Called Rebekah Avila, No answer, left message of appointment on 12-30-2020 at 2:45 via telephone visit with Cherylin Mylar, Pharm D. Notified to have all medications, supplements, blood pressure and/or blood sugar logs available during appointment and to return call if need to reschedule.   Care Gaps: Shingrix overdue Tdap overdue  Yearly foot exam overdue Covid booster overdue RAF=1.416% Last medicare wellness 05-07-2020  Star Rating Drug: Olmesartan 40 mg- Last filled 11-18-2020 [facility] Pravastatin 80 mg- Last filled 11-18-2020 [facility]  Any gaps in medications fill history? No  Huey Romans [facility] Clinical Pharmacist Assistant [phone]

## 2020-12-30 ENCOUNTER — Ambulatory Visit (INDEPENDENT_AMBULATORY_CARE_PROVIDER_SITE_OTHER): Payer: Medicare Other

## 2020-12-30 DIAGNOSIS — E7800 Pure hypercholesterolemia, unspecified: Secondary | ICD-10-CM

## 2020-12-30 DIAGNOSIS — I129 Hypertensive chronic kidney disease with stage 1 through stage 4 chronic kidney disease, or unspecified chronic kidney disease: Secondary | ICD-10-CM

## 2020-12-30 NOTE — Progress Notes (Signed)
Chronic Care Management Pharmacy Note  01/13/2021 Name:  Rebekah Avila MRN:  [id number] DOB:  07/27/1931  Summary: Patient reports that she is doing okay with her medications   Recommendations/Changes made from today's visit: Recommend patient receive her influenza vaccine.   Plan: Patient to get vaccine at retail pharmacy.    Subjective: Rebekah Avila is an 87 y.o. year old female who is a primary patient of [name], [place].  The CCM team was consulted for assistance with disease management and care coordination needs.    Engaged with patient by telephone for follow up visit in response to provider referral for pharmacy case management and/or care coordination services.   Consent to Services:  The patient was given information about Chronic Care Management services, agreed to services, and gave verbal consent prior to initiation of services.  Please see initial visit note for detailed documentation.   Patient Care Team: [name], FNP as PCP - General (General Practice) Mayford Knife, [facility] (Pharmacist)  Recent office visits: 07/20/2020 PCP OV  Recent consult visits: None noted  Hospital visits: None in previous 6 months   Objective:  Lab Results  Component Value Date   CREATININE 1.14 (H) 07/20/2020   BUN 16 07/20/2020   GFRNONAA 54 (L) 12/17/2019   GFRAA 62 12/17/2019   NA 141 07/20/2020   K 3.7 07/20/2020   CALCIUM 9.5 07/20/2020   CO2 25 07/20/2020   GLUCOSE 112 (H) 07/20/2020    Lab Results  Component Value Date/Time   HGBA1C 6.8 (H) 07/20/2020 02:24 PM   HGBA1C 6.6 (H) 12/17/2019 10:08 AM   HGBA1C 6.8 08/03/2017 12:00 AM   MICROALBUR 10 07/20/2020 12:50 PM   MICROALBUR 80 04/17/2019 12:01 PM    Last diabetic Eye exam:  Lab Results  Component Value Date/Time   HMDIABEYEEXA No Retinopathy 02/12/2020 12:00 AM    Last diabetic Foot exam: No results found for: HMDIABFOOTEX   Lab Results  Component Value Date   CHOL 180 07/20/2020    HDL 44 07/20/2020   LDLCALC 112 (H) 07/20/2020   TRIG 134 07/20/2020   CHOLHDL 4.1 07/20/2020    Hepatic Function Latest Ref Rng & Units 07/20/2020 12/17/2019 04/17/2019  Total Protein 6.0 - 8.5 g/dL 8.0 8.0 8.2  Albumin 3.6 - 4.6 g/dL 4.4 4.3 4.4  AST 0 - 40 IU/L 30 33 31  ALT 0 - 32 IU/L _0 Alk Phosphatase 44 - 121 IU/L 59 60 69  Total Bilirubin 0.0 - 1.2 mg/dL 0.6 0.6 0.5    Lab Results  Component Value Date/Time   TSH 2.510 07/20/2020 02:24 PM   TSH 2.360 08/19/2019 12:25 PM    CBC Latest Ref Rng & Units 07/20/2020 04/17/2019 04/12/2018  WBC 3.4 - 10.8 x10E3/uL 4.8 5.6 6.2  Hemoglobin 11.1 - 15.9 g/dL 11.2 11.7 11.5  Hematocrit 34.0 - 46.6 % 33.2(L) 35.0 34.3  Platelets 150 - 450 x10E3/uL 261 323 289    No results found for: VD25OH  Clinical ASCVD: Yes  The ASCVD Risk score (Arnett DK, et al., 2019) failed to calculate for the following reasons:   The 2019 ASCVD risk score is only valid for ages 33 to 93    Depression screen PHQ 2/9 05/07/2020 09/12/2019 08/19/2019  Decreased Interest 0 0 0  Down, Depressed, Hopeless 0 0 0  PHQ - 2 Score 0 0 0  Altered sleeping - - -  Tired, decreased energy - - -  Change in appetite - - -  Feeling bad or failure about yourself  - - -  Trouble concentrating - - -  Moving slowly or fidgety/restless - - -  Suicidal thoughts - - -  PHQ-9 Score - - -  Difficult doing work/chores - - -     Social History   Tobacco Use  Smoking Status Never  Smokeless Tobacco Never   BP Readings from Last 3 Encounters:  07/20/20 (!) 144/80  05/07/20 138/80  02/11/20 124/68   Pulse Readings from Last 3 Encounters:  07/20/20 78  05/07/20 76  02/11/20 70   Wt Readings from Last 3 Encounters:  07/20/20 135 lb 9.6 oz (61.5 kg)  05/07/20 138 lb 12.8 oz (63 kg)  02/11/20 138 lb 3.2 oz (62.7 kg)   BMI Readings from Last 3 Encounters:  07/20/20 25.79 kg/m  05/07/20 26.23 kg/m  02/11/20 25.77 kg/m    Assessment/Interventions: Review  of patient past medical history, allergies, medications, health status, including review of consultants reports, laboratory and other test data, was performed as part of comprehensive evaluation and provision of chronic care management services.   SDOH:  (Social Determinants of Health) assessments and interventions performed: No  SDOH Screenings   Alcohol Screen: Not on file  Depression (PHQ2-9): Low Risk    PHQ-2 Score: 0  Financial Resource Strain: Low Risk    Difficulty of Paying Living Expenses: Not hard at all  Food Insecurity: No Food Insecurity   Worried About Charity fundraiser in the Last Year: Never true   Ran Out of Food in the Last Year: Never true  Housing: Not on file  Physical Activity: Insufficiently Active   Days of Exercise per Week: 7 days   Minutes of Exercise per Session: 20 min  Social Connections: Not on file  Stress: No Stress Concern Present   Feeling of Stress : Not at all  Tobacco Use: Low Risk    Smoking Tobacco Use: Never   Smokeless Tobacco Use: Never  Transportation Needs: No Transportation Needs   Lack of Transportation (Medical): No   Lack of Transportation (Non-Medical): No    CCM Care Plan  Allergies  Allergen Reactions   Morphine And Related Rash    Medications Reviewed Today     Reviewed by Mayford Knife, RPH (Pharmacist) on 12/30/20 at 1500  Med List Status: <None>   Medication Order Taking? Sig Documenting Provider Last Dose Status Informant  ACCU-CHEK FASTCLIX LANCETS MISC [id number] No by Does not apply route. [provider] Taking Active   acetaminophen (TYLENOL) 500 MG tablet [id number] No Take 1,000 mg by mouth every 6 (six) hours as needed for moderate pain. [provider] Taking Active Self  aspirin EC 81 MG tablet [id number] No Take 81 mg by mouth daily. [provider] Taking Active Self  Blood Glucose Monitoring Suppl (ACCU-CHEK NANO SMARTVIEW) w/Device KIT [id number] No 1 strip by Does not apply  route. [provider] Taking Active   Cholecalciferol (VITAMIN D) 2000 units CAPS [id number] No Take 1 capsule by mouth daily.  [provider] Taking Active Self  Fish Oil OIL [id number] No Take 1 capsule by mouth daily. 1020m [provider] Taking Active Self  fluticasone (FLONASE) 50 MCG/ACT nasal spray [id number] Place 1 spray into both nostrils daily as needed for allergies or rhinitis. [provider] Taking Active Self  hydrochlorothiazide (HYDRODIURIL) 25 MG tablet [id number] Take 1 tablet (25 mg total) by mouth daily. MMinette Brine FNP  Active   metoprolol tartrate (  LOPRESSOR) 50 MG tablet [id number]  Take 1 tablet (50 mg total) by mouth 2 (two) times daily. [name], FNP  Active   olmesartan (BENICAR) 40 MG tablet [id number]  Take 1 tablet (40 mg total) by mouth daily. [name], FNP  Active   omeprazole (PRILOSEC OTC) 20 MG tablet [id number] No Take 20 mg by mouth daily. Bedtime [provider] Taking Active Self  pravastatin (PRAVACHOL) 80 MG tablet [id number]  One tab po M-F, skip weekends [name], FNP  Active             Patient Active Problem List   Diagnosis Date Noted   Polymyalgia rheumatica ([place]) 08/19/2019   Type 2 diabetes mellitus with stage 3 chronic kidney disease, without long-term current use of insulin ([place]) 04/12/2018   Nephropathy 08/03/2017   Hypertensive kidney disease with chronic kidney disease stage III ([place]) 08/03/2017   TIA (transient ischemic attack) 06/30/2013   HTN (hypertension) 06/30/2013    Immunization History  Administered Date(s) Administered   Fluad Quad(high Dose 65+) 02/11/2020   Influenza, High Dose Seasonal PF 02/05/2018, 12/17/2018   PFIZER(Purple Top)SARS-COV-2 Vaccination 05/18/2019, 06/21/2019, 02/08/2020, 09/08/2020   Pneumococcal Polysaccharide-23 08/19/2019    Conditions to be addressed/monitored:  Hypertension and Hyperlipidemia  Care Plan : [place]   Updates made by Mayford Knife, [place] since 01/13/2021 12:00 AM     Problem: HTN, HLD      Goal: Disease Management   Note:    Current Barriers:  Unable to independently monitor therapeutic efficacy  Pharmacist Clinical Goal(s):  Patient will achieve adherence to monitoring guidelines and medication adherence to achieve therapeutic efficacy through collaboration with PharmD and provider.   Interventions: 1:1 collaboration with [name], FNP regarding development and update of comprehensive plan of care as evidenced by provider attestation and co-signature Inter-disciplinary care team collaboration (see longitudinal plan of care) Comprehensive medication review performed; medication list updated in electronic medical record  Hypertension (BP goal <130/80) -Controlled -Current treatment: Olmesartan 40 mg tablet once per day Metoprolol Tartrate 50 mg tablet twice per day Hydrochlorothiazide 25 mg tablet daily  -Current home readings: 140/76, 135/75, 137/70 - checking her BP at the same time each day, and if its high she checks it again pulse: 70-85 -Current dietary habits: she does not use any salt in her food -Current exercise habits: she exercises at home, she does stretching daily including her arms and legs, and then she does some standing exercises.  -Denies hypotensive/hypertensive symptoms -Educated on Proper BP monitoring technique; Symptoms of hypotension and importance of maintaining adequate hydration; -Counseled to monitor BP at home at least once per day, document, and provide log at future appointments -Collaborate with PCP team to determine if patient should be  -Recommended to continue current medication  Hyperlipidemia: (LDL goal < 70) -Not ideally controlled -Current treatment: Pravastatin 80 mg tablet Monday through Friday.  -Current dietary patterns: she does not eat and fried or fatty foods.  -Current exercise habits: she exercises at home, she does  stretching daily including her arms and legs, and then she does some standing exercises.  -Educated on Benefits of statin for ASCVD risk reduction; Importance of limiting foods high in cholesterol; -Recommended to continue current medication  Health Maintenance -Vaccine gaps: COVID-19 Booster ( has received 4th dose, update in system 09/08/2020)  TDAP, Shingrix, Influenza vaccine  -Collaborated with patient and confirmed patient received 4th booster dose for COVID-19 via [employer].    Patient Goals/Self-Care Activities Patient  will:  - take medications as prescribed  Follow Up Plan: The patient has been provided with contact information for the care management team and has been advised to call with any health related questions or concerns.         Medication Assistance: None required.  Patient affirms current coverage meets needs.  Compliance/Adherence/Medication fill history: Care Gaps: Shingrix Vaccine - patient is interested  TDAP - patient is interested in receiving the vaccine  Foot Exam Influenza Vaccine  COVID-19 Booster - has had 4 shots, will ask for patients chart to be updated  Star-Rating Drugs: Pravastatin 80 mg tablet    Patient's preferred pharmacy is:  [facility] - [place], [place] - [address] [place] [address] Phone: [phone] Fax: [phone]  Uses pill box? Yes Pt endorses 85% compliance  We discussed: Benefits of medication synchronization, packaging and delivery as well as enhanced pharmacist oversight with [facility]. Patient decided to: Continue current medication management strategy  Care Plan and Follow Up Patient Decision:  Patient agrees to Care Plan and Follow-up.  Plan: The patient has been provided with contact information for the care management team and has been advised to call with any health related questions or concerns.   Orlando Penner, PharmD Clinical  Pharmacist [facility] [phone]

## 2021-01-13 NOTE — Patient Instructions (Signed)
Visit Information It was great speaking with you today!  Please let me know if you have any questions about our visit.   Goals Addressed             This Visit's Progress    Manage My Medicine       Timeframe:  Long-Range Goal Priority:  High Start Date:                             Expected End Date:                       Follow Up Date 06/30/2021    - call for medicine refill 2 or 3 days before it runs out - call if I am sick and can't take my medicine - learn to read medicine labels - use a pillbox to sort medicine - use an alarm clock or phone to remind me to take my medicine    Why is this important?   These steps will help you keep on track with your medicines.   Notes:  Please call if you have any questions about your medications.         Patient Care Plan: CCM Pharmacy Care Plan     Problem Identified: HTN, HLD      Goal: Disease Management   Note:    Current Barriers:  Unable to independently monitor therapeutic efficacy  Pharmacist Clinical Goal(s):  Patient will achieve adherence to monitoring guidelines and medication adherence to achieve therapeutic efficacy through collaboration with PharmD and provider.   Interventions: 1:1 collaboration with Arnette Felts, FNP regarding development and update of comprehensive plan of care as evidenced by provider attestation and co-signature Inter-disciplinary care team collaboration (see longitudinal plan of care) Comprehensive medication review performed; medication list updated in electronic medical record  Hypertension (BP goal <130/80) -Controlled -Current treatment: Olmesartan 40 mg tablet once per day Metoprolol Tartrate 50 mg tablet twice per day Hydrochlorothiazide 25 mg tablet daily  -Current home readings: 140/76, 135/75, 137/70 - checking her BP at the same time each day, and if its high she checks it again pulse: 70-85 -Current dietary habits: she does not use any salt in her food -Current  exercise habits: she exercises at home, she does stretching daily including her arms and legs, and then she does some standing exercises.  -Denies hypotensive/hypertensive symptoms -Educated on Proper BP monitoring technique; Symptoms of hypotension and importance of maintaining adequate hydration; -Counseled to monitor BP at home at least once per day, document, and provide log at future appointments -Collaborate with PCP team to determine if patient should be  -Recommended to continue current medication  Hyperlipidemia: (LDL goal < 70) -Not ideally controlled -Current treatment: Pravastatin 80 mg tablet Monday through Friday.  -Current dietary patterns: she does not eat and fried or fatty foods.  -Current exercise habits: she exercises at home, she does stretching daily including her arms and legs, and then she does some standing exercises.  -Educated on Benefits of statin for ASCVD risk reduction; Importance of limiting foods high in cholesterol; -Recommended to continue current medication  Health Maintenance -Vaccine gaps: COVID-19 Booster ( has received 4th dose, update in system 09/08/2020)  TDAP, Shingrix, Influenza vaccine  -Collaborated with patient and confirmed patient received 4th booster dose for COVID-19 via [employer].    Patient Goals/Self-Care Activities Patient will:  - take medications as prescribed  Follow Up Plan: The  patient has been provided with contact information for the care management team and has been advised to call with any health related questions or concerns.        Patient agreed to services and verbal consent obtained.   The patient verbalized understanding of instructions, educational materials, and care plan provided today and agreed to receive a mailed copy of patient instructions, educational materials, and care plan.   Orlando Penner, PharmD Clinical Pharmacist [facility] [phone]

## 2021-01-22 DIAGNOSIS — I129 Hypertensive chronic kidney disease with stage 1 through stage 4 chronic kidney disease, or unspecified chronic kidney disease: Secondary | ICD-10-CM

## 2021-01-22 DIAGNOSIS — E7800 Pure hypercholesterolemia, unspecified: Secondary | ICD-10-CM

## 2021-02-18 ENCOUNTER — Telehealth: Payer: Self-pay

## 2021-02-18 NOTE — Chronic Care Management (AMB) (Signed)
    Chronic Care Management Pharmacy Assistant   Name: Rebekah Avila  MRN: [id number] DOB: [birth year]   Reason for Encounter: Medication Review/ Medication coordination   Recent office visits:  None  Recent consult visits:  None  Hospital visits:  None in previous 6 months  Medications: Outpatient Encounter Medications as of 02/18/2021  Medication Sig   ACCU-CHEK FASTCLIX LANCETS MISC by Does not apply route.   acetaminophen (TYLENOL) 500 MG tablet Take 1,000 mg by mouth every 6 (six) hours as needed for moderate pain.   aspirin EC 81 MG tablet Take 81 mg by mouth daily.   Blood Glucose Monitoring Suppl (ACCU-CHEK NANO SMARTVIEW) w/Device KIT 1 strip by Does not apply route.   Cholecalciferol (VITAMIN D) 2000 units CAPS Take 1 capsule by mouth daily.    Fish Oil OIL Take 1 capsule by mouth daily. $RemoveBefo'1000mg'tTZUndqSuIS$    fluticasone (FLONASE) 50 MCG/ACT nasal spray Place 1 spray into both nostrils daily as needed for allergies or rhinitis.   hydrochlorothiazide (HYDRODIURIL) 25 MG tablet Take 1 tablet (25 mg total) by mouth daily.   metoprolol tartrate (LOPRESSOR) 50 MG tablet Take 1 tablet (50 mg total) by mouth 2 (two) times daily.   olmesartan (BENICAR) 40 MG tablet Take 1 tablet (40 mg total) by mouth daily.   omeprazole (PRILOSEC OTC) 20 MG tablet Take 20 mg by mouth daily. Bedtime   pravastatin (PRAVACHOL) 80 MG tablet One tab po M-F, skip weekends   No facility-administered encounter medications on file as of 02/18/2021.   Reviewed chart for medication changes ahead of medication coordination call.  No OVs, Consults, or hospital visits since last care coordination call/Pharmacist visit. (If appropriate, list visit date, provider name)  No medication changes indicated OR if recent visit, treatment plan here.  BP Readings from Last 3 Encounters:  07/20/20 (!) 144/80  05/07/20 138/80  02/11/20 124/68    Lab Results  Component Value Date   HGBA1C 6.8 (H) 07/20/2020     Patient  obtains medications through Adherence Packaging  90 Days   Last adherence delivery included:  Hydrochlorothiazide 25 mg- 1 tablet daily (breakfast) Olmesartan 40 mg- 1 tablet daily (breakfast) Pravastatin 80 mg- 1 tablet Monday- Friday, skip weekends(evening meal) Metoprolol Tartrate 50 mg- 1 tablet twice daily (breakfast and evening meal)  Patient declined (meds) last delivery: None  Patient is due for next adherence delivery on: 03-02-2021 Called patient and reviewed medications and coordinated delivery.  This delivery to include: Hydrochlorothiazide 25 mg- 1 tablet daily (breakfast) Olmesartan 40 mg- 1 tablet daily (breakfast) Pravastatin 80 mg- 1 tablet Monday- Friday, skip weekends(evening meal) Metoprolol Tartrate 50 mg- 1 tablet twice daily (breakfast and evening meal)  No acute/Short fill needed  Patient declined the following medications: None  Patient needs refills for: None  Confirmed delivery date of 03-02-2021 advised patient that pharmacy will contact them the morning of delivery.   Care Gaps: Shingrix overdue Tdap overdue last completed 07-07-2005 Yearly foot exam overdue last completed 04-17-2019 PNA Vac overdue last completed 08-19-2019 Covid booster overdue last completed 09-08-2020 Flu vaccine overdue last completed 02-11-2020 A1C overdue last completed 07-20-2020 Yearly ophthalmology overdue last completed 02-12-2020   Star Rating Drugs: Pravastatin 80 mg- Last filled 11-18-2020 [facility] Olmesartan 40 mg- Last filled 11-18-2020 [facility]  [place] Clinical Pharmacist Assistant [phone]

## 2021-04-15 ENCOUNTER — Telehealth: Payer: Self-pay

## 2021-04-15 NOTE — Progress Notes (Signed)
Chronic Care Management Pharmacy Assistant   Name: Rebekah Avila  MRN: [id number] DOB: March 13, 1932  Reason for Encounter: Disease State/ Hypertension    Recent office visits: None   Recent consult visits: None   Hospital visits:  None in previous 6 months  Medications: Outpatient Encounter Medications as of 04/15/2021  Medication Sig   ACCU-CHEK FASTCLIX LANCETS MISC by Does not apply route.   acetaminophen (TYLENOL) 500 MG tablet Take 1,000 mg by mouth every 6 (six) hours as needed for moderate pain.   aspirin EC 81 MG tablet Take 81 mg by mouth daily.   Blood Glucose Monitoring Suppl (ACCU-CHEK NANO SMARTVIEW) w/Device KIT 1 strip by Does not apply route.   Cholecalciferol (VITAMIN D) 2000 units CAPS Take 1 capsule by mouth daily.    Fish Oil OIL Take 1 capsule by mouth daily. 1065m   fluticasone (FLONASE) 50 MCG/ACT nasal spray Place 1 spray into both nostrils daily as needed for allergies or rhinitis.   hydrochlorothiazide (HYDRODIURIL) 25 MG tablet Take 1 tablet (25 mg total) by mouth daily.   metoprolol tartrate (LOPRESSOR) 50 MG tablet Take 1 tablet (50 mg total) by mouth 2 (two) times daily.   olmesartan (BENICAR) 40 MG tablet Take 1 tablet (40 mg total) by mouth daily.   omeprazole (PRILOSEC OTC) 20 MG tablet Take 20 mg by mouth daily. Bedtime   pravastatin (PRAVACHOL) 80 MG tablet One tab po M-F, skip weekends   No facility-administered encounter medications on file as of 04/15/2021.    Recent Office Vitals: BP Readings from Last 3 Encounters:  07/20/20 (!) 144/80  05/07/20 138/80  02/11/20 124/68   Pulse Readings from Last 3 Encounters:  07/20/20 78  05/07/20 76  02/11/20 70    Wt Readings from Last 3 Encounters:  07/20/20 135 lb 9.6 oz (61.5 kg)  05/07/20 138 lb 12.8 oz (63 kg)  02/11/20 138 lb 3.2 oz (62.7 kg)     Kidney Function Lab Results  Component Value Date/Time   CREATININE 1.14 (H) 07/20/2020 02:24 PM   CREATININE 0.96 12/17/2019  10:08 AM   GFRNONAA 54 (L) 12/17/2019 10:08 AM   GFRAA 62 12/17/2019 10:08 AM    BMP Latest Ref Rng & Units 07/20/2020 12/17/2019 08/19/2019  Glucose 65 - 99 mg/dL 112(H) 124(H) 116(H)  BUN 8 - 27 mg/dL '16 11 13  ' Creatinine 0.57 - 1.00 mg/dL 1.14(H) 0.96 1.04(H)  BUN/Creat Ratio 12 - 28 14 11(L) 13  Sodium 134 - 144 mmol/L 141 141 141  Potassium 3.5 - 5.2 mmol/L 3.7 3.8 3.9  Chloride 96 - 106 mmol/L 102 103 101  CO2 20 - 29 mmol/L '25 24 25  ' Calcium 8.7 - 10.3 mg/dL 9.5 9.8 9.9     Current antihypertensive regimen:  Olmesartan 40 mg 1 tablet by mouth daily Metoprolol Tartrate 50 mg 1 tablet by mouth daily Hydrochlorothiazide 25 mg 1 tablet by mouth daily  Patient verbally confirms she is taking the above medications as directed. Yes  How often are you checking your Blood Pressure? daily  she checks her blood pressure in the morning before taking her medication.  Current home BP readings:   DATE:             BP               PULSE  12/26       131/69          69 12/25  128/70         72 12/24          130/68         68    Wrist or arm cuff:arm cuff Caffeine intake: none Salt intake:none OTC medications including pseudoephedrine or NSAIDs?  Any readings above 180/120? No If yes any symptoms of hypertensive emergency?    What recent interventions/DTPs have been made by any provider to improve Blood Pressure control since last CPP Visit: Decrease sodium intake and increase exercise daily  Any recent hospitalizations or ED visits since last visit with CPP? No  What diet changes have been made to improve Blood Pressure Control?  Patient does not add sodium to food  What exercise is being done to improve your Blood Pressure Control?  Patient walks around the house and does all she can do for herself.   Adherence Review: Is the patient currently on ACE/ARB medication? Yes Does the patient have >5 day gap between last estimated fill dates? Yes   Called patient  04/15/2021 left message  Care Gaps: Last annual wellness visit- 05-07-2020 Ophthalmology exam -02-12-2020 Foot exam- 04-17-2019  Star Rating Drugs:  Medication:  Last Fill: Day Supply   Pravastatin 80 mg          07/27, 11/01        90 DS  Olmesartan 40 mg            07/27, 11/01       90 DS      Cherlyn Labella Clinical Pharmacist Assistant [phone]

## 2021-05-12 ENCOUNTER — Other Ambulatory Visit: Payer: Self-pay

## 2021-05-12 ENCOUNTER — Ambulatory Visit (INDEPENDENT_AMBULATORY_CARE_PROVIDER_SITE_OTHER): Payer: Medicare Other

## 2021-05-12 VITALS — BP 132/64 | HR 76 | Temp 97.8°F | Ht 61.4 in | Wt 134.8 lb

## 2021-05-12 DIAGNOSIS — Z23 Encounter for immunization: Secondary | ICD-10-CM | POA: Diagnosis not present

## 2021-05-12 DIAGNOSIS — Z0000 Encounter for general adult medical examination without abnormal findings: Secondary | ICD-10-CM

## 2021-05-12 NOTE — Patient Instructions (Signed)
Rebekah Avila , Thank you for taking time to come for your Medicare Wellness Visit. I appreciate your ongoing commitment to your health goals. Please review the following plan we discussed and let me know if I can assist you in the future.   Screening recommendations/referrals: Colonoscopy: not required Mammogram: not required Bone Density: completed 04/21/2016 Recommended yearly ophthalmology/optometry visit for glaucoma screening and checkup Recommended yearly dental visit for hygiene and checkup  Vaccinations: Influenza vaccine: today Pneumococcal vaccine: due Tdap vaccine: due Shingles vaccine: discussed   Covid-19: 02/05/2021, 09/08/2020, 02/08/2020, 06/21/2019, 05/18/2019  Advanced directives: Please bring a copy of your POA (Power of Attorney) and/or Living Will to your next appointment.   Conditions/risks identified: none  Next appointment: Follow up in one year for your annual wellness visit    Preventive Care 65 Years and Older, Female Preventive care refers to lifestyle choices and visits with your health care provider that can promote health and wellness. What does preventive care include? A yearly physical exam. This is also called an annual well check. Dental exams once or twice a year. Routine eye exams. Ask your health care provider how often you should have your eyes checked. Personal lifestyle choices, including: Daily care of your teeth and gums. Regular physical activity. Eating a healthy diet. Avoiding tobacco and drug use. Limiting alcohol use. Practicing safe sex. Taking low-dose aspirin every day. Taking vitamin and mineral supplements as recommended by your health care provider. What happens during an annual well check? The services and screenings done by your health care provider during your annual well check will depend on your age, overall health, lifestyle risk factors, and family history of disease. Counseling  Your health care provider may ask you  questions about your: Alcohol use. Tobacco use. Drug use. Emotional well-being. Home and relationship well-being. Sexual activity. Eating habits. History of falls. Memory and ability to understand (cognition). Work and work Astronomer. Reproductive health. Screening  You may have the following tests or measurements: Height, weight, and BMI. Blood pressure. Lipid and cholesterol levels. These may be checked every 5 years, or more frequently if you are over 52 years old. Skin check. Lung cancer screening. You may have this screening every year starting at age 43 if you have a 30-pack-year history of smoking and currently smoke or have quit within the past 15 years. Fecal occult blood test (FOBT) of the stool. You may have this test every year starting at age 92. Flexible sigmoidoscopy or colonoscopy. You may have a sigmoidoscopy every 5 years or a colonoscopy every 10 years starting at age 35. Hepatitis C blood test. Hepatitis B blood test. Sexually transmitted disease (STD) testing. Diabetes screening. This is done by checking your blood sugar (glucose) after you have not eaten for a while (fasting). You may have this done every 1-3 years. Bone density scan. This is done to screen for osteoporosis. You may have this done starting at age 66. Mammogram. This may be done every 1-2 years. Talk to your health care provider about how often you should have regular mammograms. Talk with your health care provider about your test results, treatment options, and if necessary, the need for more tests. Vaccines  Your health care provider may recommend certain vaccines, such as: Influenza vaccine. This is recommended every year. Tetanus, diphtheria, and acellular pertussis (Tdap, Td) vaccine. You may need a Td booster every 10 years. Zoster vaccine. You may need this after age 76. Pneumococcal 13-valent conjugate (PCV13) vaccine. One dose is recommended after  age 40. Pneumococcal polysaccharide  (PPSV23) vaccine. One dose is recommended after age 37. Talk to your health care provider about which screenings and vaccines you need and how often you need them. This information is not intended to replace advice given to you by your health care provider. Make sure you discuss any questions you have with your health care provider. Document Released: 05/08/2015 Document Revised: 12/30/2015 Document Reviewed: 02/10/2015 Elsevier Interactive Patient Education  2017 [place] Prevention in the Home Falls can cause injuries. They can happen to people of all ages. There are many things you can do to make your home safe and to help prevent falls. What can I do on the outside of my home? Regularly fix the edges of walkways and driveways and fix any cracks. Remove anything that might make you trip as you walk through a door, such as a raised step or threshold. Trim any bushes or trees on the path to your home. Use bright outdoor lighting. Clear any walking paths of anything that might make someone trip, such as rocks or tools. Regularly check to see if handrails are loose or broken. Make sure that both sides of any steps have handrails. Any raised decks and porches should have guardrails on the edges. Have any leaves, snow, or ice cleared regularly. Use sand or salt on walking paths during winter. Clean up any spills in your garage right away. This includes oil or grease spills. What can I do in the bathroom? Use night lights. Install grab bars by the toilet and in the tub and shower. Do not use towel bars as grab bars. Use non-skid mats or decals in the tub or shower. If you need to sit down in the shower, use a plastic, non-slip stool. Keep the floor dry. Clean up any water that spills on the floor as soon as it happens. Remove soap buildup in the tub or shower regularly. Attach bath mats securely with double-sided non-slip rug tape. Do not have throw rugs and other things on the  floor that can make you trip. What can I do in the bedroom? Use night lights. Make sure that you have a light by your bed that is easy to reach. Do not use any sheets or blankets that are too big for your bed. They should not hang down onto the floor. Have a firm chair that has side arms. You can use this for support while you get dressed. Do not have throw rugs and other things on the floor that can make you trip. What can I do in the kitchen? Clean up any spills right away. Avoid walking on wet floors. Keep items that you use a lot in easy-to-reach places. If you need to reach something above you, use a strong step stool that has a grab bar. Keep electrical cords out of the way. Do not use floor polish or wax that makes floors slippery. If you must use wax, use non-skid floor wax. Do not have throw rugs and other things on the floor that can make you trip. What can I do with my stairs? Do not leave any items on the stairs. Make sure that there are handrails on both sides of the stairs and use them. Fix handrails that are broken or loose. Make sure that handrails are as long as the stairways. Check any carpeting to make sure that it is firmly attached to the stairs. Fix any carpet that is loose or worn. Avoid having throw rugs  at the top or bottom of the stairs. If you do have throw rugs, attach them to the floor with carpet tape. Make sure that you have a light switch at the top of the stairs and the bottom of the stairs. If you do not have them, ask someone to add them for you. What else can I do to help prevent falls? Wear shoes that: Do not have high heels. Have rubber bottoms. Are comfortable and fit you well. Are closed at the toe. Do not wear sandals. If you use a stepladder: Make sure that it is fully opened. Do not climb a closed stepladder. Make sure that both sides of the stepladder are locked into place. Ask someone to hold it for you, if possible. Clearly mark and make  sure that you can see: Any grab bars or handrails. First and last steps. Where the edge of each step is. Use tools that help you move around (mobility aids) if they are needed. These include: Canes. Walkers. Scooters. Crutches. Turn on the lights when you go into a dark area. Replace any light bulbs as soon as they burn out. Set up your furniture so you have a clear path. Avoid moving your furniture around. If any of your floors are uneven, fix them. If there are any pets around you, be aware of where they are. Review your medicines with your doctor. Some medicines can make you feel dizzy. This can increase your chance of falling. Ask your doctor what other things that you can do to help prevent falls. This information is not intended to replace advice given to you by your health care provider. Make sure you discuss any questions you have with your health care provider. Document Released: 02/05/2009 Document Revised: 09/17/2015 Document Reviewed: 05/16/2014 Elsevier Interactive Patient Education  2017 [employer].

## 2021-05-12 NOTE — Progress Notes (Signed)
This visit occurred during the SARS-CoV-2 public health emergency.  Safety protocols were in place, including screening questions prior to the visit, additional usage of staff PPE, and extensive cleaning of exam room while observing appropriate contact time as indicated for disinfecting solutions.  Subjective:   Rebekah Avila is a 88 y.o. female who presents for Medicare Annual (Subsequent) preventive examination.  Review of Systems     Cardiac Risk Factors include: advanced age (>70mn, >>59women);diabetes mellitus;hypertension     Objective:    Today's Vitals   05/12/21 1153 05/12/21 1158  BP: 132/64   Pulse: 76   Temp: 97.8 F (36.6 C)   TempSrc: Oral   SpO2: 95%   Weight: 134 lb 12.8 oz (61.1 kg)   Height: 5' 1.4" (1.56 m)   PainSc:  3    Body mass index is 25.14 kg/m.  Advanced Directives 05/12/2021 05/07/2020 04/17/2019 06/30/2013  Does Patient Have a Medical Advance Directive? Yes No Yes Patient has advance directive, copy not in chart  Type of Advance Directive [place] - Healthcare Power of [place]  Does patient want to make changes to medical advance directive? - - - No change requested  Copy of [place] Chart? No - copy requested - No - copy requested Copy requested from family  Would patient like information on creating a medical advance directive? - No - Patient declined - -  Pre-existing out of facility DNR order (yellow form or pink MOST form) - - - No    Current Medications (verified) Outpatient Encounter Medications as of 05/12/2021  Medication Sig   ACCU-CHEK FASTCLIX LANCETS MISC by Does not apply route.   acetaminophen (TYLENOL) 500 MG tablet Take 1,000 mg by mouth every 6 (six) hours as needed for moderate pain.   aspirin EC 81 MG tablet Take 81 mg by mouth daily.   Blood Glucose Monitoring Suppl (ACCU-CHEK NANO SMARTVIEW) w/Device KIT 1 strip by Does not apply  route.   Cholecalciferol (VITAMIN D) 2000 units CAPS Take 1 capsule by mouth daily.    Fish Oil OIL Take 1 capsule by mouth daily. 10058m  fluticasone (FLONASE) 50 MCG/ACT nasal spray Place 1 spray into both nostrils daily as needed for allergies or rhinitis.   hydrochlorothiazide (HYDRODIURIL) 25 MG tablet Take 1 tablet (25 mg total) by mouth daily.   metoprolol tartrate (LOPRESSOR) 50 MG tablet Take 1 tablet (50 mg total) by mouth 2 (two) times daily.   olmesartan (BENICAR) 40 MG tablet Take 1 tablet (40 mg total) by mouth daily.   omeprazole (PRILOSEC OTC) 20 MG tablet Take 20 mg by mouth daily. Bedtime   pravastatin (PRAVACHOL) 80 MG tablet One tab po M-F, skip weekends   No facility-administered encounter medications on file as of 05/12/2021.    Allergies (verified) Morphine and related   History: Past Medical History:  Diagnosis Date   [place]   High cholesterol    Hypertension    Polymyalgia rheumatica (HCC)    Past Surgical History:  Procedure Laterality Date   ECTOPIC PREGNANCY SURGERY     HERNIA REPAIR     Family History  Problem Relation Age of Onset   Healthy Mother    Heart attack Father    Social History   Socioeconomic History   Marital status: Single    Spouse name: Not on file   Number of children: Not on file   Years  of education: Not on file   Highest education level: Not on file  Occupational History   Occupation: retired  Tobacco Use   Smoking status: Never   Smokeless tobacco: Never  Vaping Use   Vaping Use: Never used  Substance and Sexual Activity   Alcohol use: No   Drug use: No   Sexual activity: Not Currently  Other Topics Concern   Not on file  Social History Narrative   Not on file   Social Determinants of Health   Financial Resource Strain: Low Risk    Difficulty of Paying Living Expenses: Not hard at all  Food Insecurity: No Food Insecurity   Worried About Charity fundraiser in the Last Year: Never true   [place] in the Last Year: Never true  Transportation Needs: No Transportation Needs   Lack of Transportation (Medical): No   Lack of Transportation (Non-Medical): No  Physical Activity: Insufficiently Active   Days of Exercise per Week: 7 days   Minutes of Exercise per Session: 20 min  Stress: No Stress Concern Present   Feeling of Stress : Not at all  Social Connections: Not on file    Tobacco Counseling Counseling given: Not Answered   Clinical Intake:  Pre-visit preparation completed: Yes  Pain : 0-10 Pain Score: 3  Pain Type: Chronic pain Pain Location: Arm Pain Orientation: Left, Right Pain Descriptors / Indicators: Aching Pain Onset: More than a month ago Pain Frequency: Constant Pain Relieving Factors: aspercreme  Pain Relieving Factors: aspercreme  Nutritional Status: BMI 25 -29 Overweight Nutritional Risks: None Diabetes: Yes  How often do you need to have someone help you when you read instructions, pamphlets, or other written materials from your doctor or pharmacy?: 1 - Never What is the last grade level you completed in school?: college  Diabetic? Yes Nutrition Risk Assessment:  Has the patient had any N/V/D within the last 2 months?  No  Does the patient have any non-healing wounds?  No  Has the patient had any unintentional weight loss or weight gain?  No   Diabetes:  Is the patient diabetic?  Yes  If diabetic, was a CBG obtained today?  No  Did the patient bring in their glucometer from home?  No  How often do you monitor your CBG's? daily.   Financial Strains and Diabetes Management:  Are you having any financial strains with the device, your supplies or your medication? No .  Does the patient want to be seen by Chronic Care Management for management of their diabetes?  No  Would the patient like to be referred to a Nutritionist or for Diabetic Management?  No   Diabetic Exams:  Diabetic Eye Exam: Completed 02/12/2020 Diabetic Foot Exam:  Overdue, Pt has been advised about the importance in completing this exam. Pt is scheduled for diabetic foot exam on next appointment.   Interpreter Needed?: No  Information entered by :: NAllen LPN   Activities of Daily Living In your present state of health, do you have any difficulty performing the following activities: 05/12/2021  Hearing? N  Vision? N  Difficulty concentrating or making decisions? Y  Walking or climbing stairs? N  Dressing or bathing? N  Doing errands, shopping? N  Preparing Food and eating ? N  Using the Toilet? N  In the past six months, have you accidently leaked urine? N  Do you have problems with loss of bowel control? N  Managing your Medications? N  Managing your Finances? N  Housekeeping or managing your Housekeeping? N  Some recent data might be hidden    Patient Care Team: [name], FNP as PCP - General (General Practice) Mayford Knife, [facility] (Pharmacist)  Indicate any recent Medical Services you may have received from other than [facility] providers in the past year (date may be approximate).     Assessment:   This is a routine wellness examination for Rebekah Avila.  Hearing/Vision screen Vision Screening - Comments:: Regular eye exams, [facility]  Dietary issues and exercise activities discussed: Current Exercise Habits: Home exercise routine, Type of exercise: stretching, Time (Minutes): 20, Frequency (Times/Week): 7, Weekly Exercise (Minutes/Week): 140   Goals Addressed             This Visit's Progress    Patient Stated       05/12/2021, no goals       Depression Screen PHQ 2/9 Scores 05/12/2021 05/07/2020 09/12/2019 08/19/2019 04/17/2019 04/17/2019 04/12/2018  PHQ - 2 Score 0 0 0 0 0 0 0  PHQ- 9 Score - - - - 0 - -    Fall Risk Fall Risk  05/12/2021 05/07/2020 09/12/2019 08/19/2019 04/17/2019  Falls in the past year? 0 0 0 0 0  Number falls in past yr: - - 0 0 -  Injury with Fall? - - 0 0 -  Risk for fall due to :  Medication side effect Medication side effect - - Medication side effect  Follow up Falls evaluation completed;Education provided;Falls prevention discussed Falls evaluation completed;Education provided;Falls prevention discussed - - Falls evaluation completed;Education provided;Falls prevention discussed    FALL RISK PREVENTION PERTAINING TO THE HOME:  Any stairs in or around the home? Yes  If so, are there any without handrails? No  Home free of loose throw rugs in walkways, pet beds, electrical cords, etc? Yes  Adequate lighting in your home to reduce risk of falls? Yes   ASSISTIVE DEVICES UTILIZED TO PREVENT FALLS:  Life alert? No  Use of a cane, walker or w/c? No  Grab bars in the bathroom? Yes  Shower chair or bench in shower? Yes  Elevated toilet seat or a handicapped toilet? Yes   TIMED UP AND GO:  Was the test performed? No .     Gait steady and fast without use of assistive device  Cognitive Function:     6CIT Screen 05/12/2021 05/07/2020 04/17/2019 04/12/2018  What Year? 0 points 0 points 0 points 0 points  What month? 0 points 0 points 0 points 0 points  What time? 0 points 3 points 0 points 0 points  Count back from 20 0 points 0 points 0 points 0 points  Months in reverse 0 points 0 points 0 points 0 points  Repeat phrase 8 points 10 points 8 points 0 points  Total Score _0 0    Immunizations Immunization History  Administered Date(s) Administered   Fluad Quad(high Dose 65+) 02/11/2020, 05/12/2021   Influenza, High Dose Seasonal PF 02/05/2018, 12/17/2018   PFIZER(Purple Top)SARS-COV-2 Vaccination 05/18/2019, 06/21/2019, 02/08/2020, 09/08/2020   Pfizer Covid-19 Vaccine Bivalent Booster 74yr & up 02/05/2021   Pneumococcal Polysaccharide-23 08/19/2019    TDAP status: Due, Education has been provided regarding the importance of this vaccine. Advised may receive this vaccine at local pharmacy or Health Dept. Aware to provide a copy of the vaccination  record if obtained from local pharmacy or Health Dept. Verbalized acceptance and understanding.  Flu Vaccine status: Completed at today's visit  Pneumococcal vaccine status: Due, Education has been provided regarding the importance of this vaccine. Advised may receive this vaccine at local pharmacy or Health Dept. Aware to provide a copy of the vaccination record if obtained from local pharmacy or Health Dept. Verbalized acceptance and understanding.  Covid-19 vaccine status: Completed vaccines  Qualifies for Shingles Vaccine? Yes   Zostavax completed No   Shingrix Completed?: No.    Education has been provided regarding the importance of this vaccine. Patient has been advised to call insurance company to determine out of pocket expense if they have not yet received this vaccine. Advised may also receive vaccine at local pharmacy or Health Dept. Verbalized acceptance and understanding.  Screening Tests Health Maintenance  Topic Date Due   Zoster Vaccines- Shingrix (1 of 2) Never done   TETANUS/TDAP  07/08/2015   FOOT EXAM  04/16/2020   Pneumonia Vaccine 52+ Years old (2 - PCV) 08/18/2020   HEMOGLOBIN A1C  01/20/2021   OPHTHALMOLOGY EXAM  02/11/2021   INFLUENZA VACCINE  Completed   DEXA SCAN  Completed   COVID-19 Vaccine  Completed   HPV VACCINES  Aged Out    Health Maintenance  Health Maintenance Due  Topic Date Due   Zoster Vaccines- Shingrix (1 of 2) Never done   TETANUS/TDAP  07/08/2015   FOOT EXAM  04/16/2020   Pneumonia Vaccine 81+ Years old (2 - PCV) 08/18/2020   HEMOGLOBIN A1C  01/20/2021   OPHTHALMOLOGY EXAM  02/11/2021    Colorectal cancer screening: No longer required.   Mammogram status: No longer required due to age.  Bone Density status: Completed 04/21/2016.   Lung Cancer Screening: (Low Dose CT Chest recommended if Age 110-80 years, 30 pack-year currently smoking OR have quit w/in 15years.) does not qualify.   Lung Cancer Screening Referral:  no  Additional Screening:  Hepatitis C Screening: does not qualify;   Vision Screening: Recommended annual ophthalmology exams for early detection of glaucoma and other disorders of the eye. Is the patient up to date with their annual eye exam?  No  Who is the provider or what is the name of the office in which the patient attends annual eye exams? [facility] If pt is not established with a provider, would they like to be referred to a provider to establish care? No .   Dental Screening: Recommended annual dental exams for proper oral hygiene  Community Resource Referral / Chronic Care Management: CRR required this visit?  No   CCM required this visit?  No      Plan:     I have personally reviewed and noted the following in the patients chart:   Medical and social history Use of alcohol, tobacco or illicit drugs  Current medications and supplements including opioid prescriptions.  Functional ability and status Nutritional status Physical activity Advanced directives List of other physicians Hospitalizations, surgeries, and ER visits in previous 12 months Vitals Screenings to include cognitive, depression, and falls Referrals and appointments  In addition, I have reviewed and discussed with patient certain preventive protocols, quality metrics, and best practice recommendations. A written personalized care plan for preventive services as well as general preventive health recommendations were provided to patient.     [name], LPN   [id number]   Nurse Notes: none

## 2021-05-18 ENCOUNTER — Encounter: Payer: Self-pay | Admitting: Nurse Practitioner

## 2021-05-18 ENCOUNTER — Encounter: Payer: Self-pay | Admitting: Internal Medicine

## 2021-05-18 DIAGNOSIS — H04123 Dry eye syndrome of bilateral lacrimal glands: Secondary | ICD-10-CM | POA: Diagnosis not present

## 2021-05-18 DIAGNOSIS — H11823 Conjunctivochalasis, bilateral: Secondary | ICD-10-CM | POA: Diagnosis not present

## 2021-05-18 DIAGNOSIS — E119 Type 2 diabetes mellitus without complications: Secondary | ICD-10-CM | POA: Diagnosis not present

## 2021-05-18 DIAGNOSIS — H02831 Dermatochalasis of right upper eyelid: Secondary | ICD-10-CM | POA: Diagnosis not present

## 2021-05-18 LAB — HM DIABETES EYE EXAM

## 2021-05-20 ENCOUNTER — Telehealth: Payer: Self-pay

## 2021-05-20 NOTE — Chronic Care Management (AMB) (Signed)
° ° °  Chronic Care Management Pharmacy Assistant   Name: Rebekah Avila  MRN: [id number] DOB: [birth year]  Reason for Encounter: Medication Review/ Medication coordination   Recent office visits:  05-12-2020 Rebekah Simmering, LPN. Medicare wellness exam.  Recent consult visits:  None  Hospital visits:  None in previous 6 months  Medications: Outpatient Encounter Medications as of 05/20/2021  Medication Sig   ACCU-CHEK FASTCLIX LANCETS MISC by Does not apply route.   acetaminophen (TYLENOL) 500 MG tablet Take 1,000 mg by mouth every 6 (six) hours as needed for moderate pain.   aspirin EC 81 MG tablet Take 81 mg by mouth daily.   Blood Glucose Monitoring Suppl (ACCU-CHEK NANO SMARTVIEW) w/Device KIT 1 strip by Does not apply route.   Cholecalciferol (VITAMIN D) 2000 units CAPS Take 1 capsule by mouth daily.    Fish Oil OIL Take 1 capsule by mouth daily. $RemoveBefo'1000mg'WiLyIJyNDRh$    fluticasone (FLONASE) 50 MCG/ACT nasal spray Place 1 spray into both nostrils daily as needed for allergies or rhinitis.   hydrochlorothiazide (HYDRODIURIL) 25 MG tablet Take 1 tablet (25 mg total) by mouth daily.   metoprolol tartrate (LOPRESSOR) 50 MG tablet Take 1 tablet (50 mg total) by mouth 2 (two) times daily.   olmesartan (BENICAR) 40 MG tablet Take 1 tablet (40 mg total) by mouth daily.   omeprazole (PRILOSEC OTC) 20 MG tablet Take 20 mg by mouth daily. Bedtime   pravastatin (PRAVACHOL) 80 MG tablet One tab po M-F, skip weekends   No facility-administered encounter medications on file as of 05/20/2021.  Reviewed chart for medication changes ahead of medication coordination call.  No Consults or hospital visits since last care coordination call/Pharmacist visit.  No medication changes indicated OR if recent visit, treatment plan here.  BP Readings from Last 3 Encounters:  05/12/21 132/64  07/20/20 (!) 144/80  05/07/20 138/80    Lab Results  Component Value Date   HGBA1C 6.8 (H) 07/20/2020     Patient obtains  medications through Adherence Packaging  90 Days   Last adherence delivery included:  Hydrochlorothiazide 25 mg- 1 tablet daily (breakfast) Olmesartan 40 mg- 1 tablet daily (breakfast) Pravastatin 80 mg- 1 tablet Monday- Friday, skip weekends(evening meal) Metoprolol Tartrate 50 mg- 1 tablet twice daily (breakfast and evening meal)  Patient declined (meds) last month: None  Patient is due for next adherence delivery on: 06-01-2021  Called patient and reviewed medications and coordinated delivery.  This delivery to include: Hydrochlorothiazide 25 mg- 1 tablet daily (breakfast) Olmesartan 40 mg- 1 tablet daily (breakfast) Pravastatin 80 mg- 1 tablet Monday- Friday, skip weekends(evening meal) Metoprolol Tartrate 50 mg- 1 tablet twice daily (breakfast and evening meal)  No acute/ short fill needed  Patient declined the following medications: None  Patient needs refills for: Refill request sent HCTZ Pravastatin Olmesartan Metoprolol  Confirmed delivery date of 06-01-2021 advised patient that pharmacy will contact them the morning of delivery.   Care Gaps: Shingrix overdue Tdap overdue last completed 07-07-2005 Yearly foot exam overdue last completed 04-17-2019 A1C overdue last completed 07-20-2020 PNA Vac overdue last completed 08-19-2019 AWV 05-12-2021  Star Rating Drugs: Pravastatin 80 mg- Last filled 02-23-2021 [facility] Olmesartan 40 mg- Last filled 02-23-2021 [facility]  [place] Clinical Pharmacist Assistant [phone]

## 2021-05-21 ENCOUNTER — Other Ambulatory Visit: Payer: Self-pay

## 2021-05-21 MED ORDER — HYDROCHLOROTHIAZIDE 25 MG PO TABS: 25.0000 mg | ORAL_TABLET | Freq: Every day | ORAL | 2 refills | Status: DC

## 2021-05-21 MED ORDER — PRAVASTATIN SODIUM 80 MG PO TABS: ORAL_TABLET | ORAL | 2 refills | Status: DC

## 2021-05-21 MED ORDER — METOPROLOL TARTRATE 50 MG PO TABS: 50.0000 mg | ORAL_TABLET | Freq: Two times a day (BID) | ORAL | 2 refills | Status: DC

## 2021-05-21 MED ORDER — OLMESARTAN MEDOXOMIL 40 MG PO TABS: 40.0000 mg | ORAL_TABLET | Freq: Every day | ORAL | 2 refills | Status: DC

## 2021-06-30 ENCOUNTER — Telehealth: Payer: Medicare Other

## 2021-07-22 ENCOUNTER — Telehealth: Payer: Self-pay

## 2021-07-22 ENCOUNTER — Encounter: Payer: Self-pay | Admitting: Nurse Practitioner

## 2021-07-22 ENCOUNTER — Ambulatory Visit (INDEPENDENT_AMBULATORY_CARE_PROVIDER_SITE_OTHER): Payer: Medicare Other | Admitting: Nurse Practitioner

## 2021-07-22 VITALS — BP 134/70 | HR 70 | Temp 98.7°F | Ht 61.8 in | Wt 133.2 lb

## 2021-07-22 DIAGNOSIS — Z0000 Encounter for general adult medical examination without abnormal findings: Secondary | ICD-10-CM

## 2021-07-22 DIAGNOSIS — I129 Hypertensive chronic kidney disease with stage 1 through stage 4 chronic kidney disease, or unspecified chronic kidney disease: Secondary | ICD-10-CM | POA: Diagnosis not present

## 2021-07-22 DIAGNOSIS — N183 Chronic kidney disease, stage 3 unspecified: Secondary | ICD-10-CM

## 2021-07-22 DIAGNOSIS — Z79899 Other long term (current) drug therapy: Secondary | ICD-10-CM

## 2021-07-22 DIAGNOSIS — E7800 Pure hypercholesterolemia, unspecified: Secondary | ICD-10-CM

## 2021-07-22 DIAGNOSIS — E1122 Type 2 diabetes mellitus with diabetic chronic kidney disease: Secondary | ICD-10-CM | POA: Diagnosis not present

## 2021-07-22 DIAGNOSIS — Z23 Encounter for immunization: Secondary | ICD-10-CM | POA: Diagnosis not present

## 2021-07-22 DIAGNOSIS — M353 Polymyalgia rheumatica: Secondary | ICD-10-CM

## 2021-07-22 MED ORDER — TETANUS-DIPHTH-ACELL PERTUSSIS 5-2.5-18.5 LF-MCG/0.5 IM SUSP: 0.5000 mL | Freq: Once | INTRAMUSCULAR | 0 refills | Status: AC

## 2021-07-22 NOTE — Chronic Care Management (AMB) (Signed)
Patient's Tdap prescription that was sent to [facility] will need to be transferred to another pharmacy. Unable to leave patient a voicemail. Updated [place]. ? ?Jeannette How CMA ?Clinical Pharmacist Assistant ?[phone] ? ?

## 2021-07-22 NOTE — Patient Instructions (Signed)

## 2021-07-22 NOTE — Progress Notes (Signed)
?Industrial/product designer as a Education administrator for [employer], FNP.,have documented all relevant documentation on the behalf of [name], FNP,as directed by  [name], FNP while in the presence of [name], [place]. ? ?This visit occurred during the SARS-CoV-2 public health emergency.  Safety protocols were in place, including screening questions prior to the visit, additional usage of staff PPE, and extensive cleaning of exam room while observing appropriate contact time as indicated for disinfecting solutions. ? ?Subjective:  ?  ? Patient ID: Rebekah Avila , female    DOB: 05-Aug-1931 , 88 y.o.   MRN: [id number] ? ? ?Chief Complaint  ?Patient presents with  ? Annual Exam  ? ? ?HPI ? ?Patient here for her physical. She lives alone but her family lives nearby.  ? ?Diabetes ?She presents for her follow-up diabetic visit. She has type 2 diabetes mellitus. There are no hypoglycemic associated symptoms. There are no diabetic associated symptoms. There are no diabetic complications. Risk factors for coronary artery disease include sedentary lifestyle. Current diabetic treatment includes oral agent (monotherapy).   ? ?Past Medical History:  ?Diagnosis Date  ? Diabetes ([place])   ? High cholesterol   ? Hypertension   ? Polymyalgia rheumatica ([place])   ?  ? ?Family History  ?Problem Relation Age of Onset  ? Healthy Mother   ? Heart attack Father   ? ? ? ?Current Outpatient Medications:  ?  ACCU-CHEK FASTCLIX LANCETS MISC, by Does not apply route., Disp: , Rfl:  ?  acetaminophen (TYLENOL) 500 MG tablet, Take 1,000 mg by mouth every 6 (six) hours as needed for moderate pain., Disp: , Rfl:  ?  aspirin EC 81 MG tablet, Take 81 mg by mouth daily., Disp: , Rfl:  ?  Blood Glucose Monitoring Suppl (ACCU-CHEK NANO SMARTVIEW) w/Device KIT, 1 strip by Does not apply route., Disp: , Rfl:  ?  Cholecalciferol (VITAMIN D) 2000 units CAPS, Take 1 capsule by mouth daily. , Disp: , Rfl:  ?  Fish Oil OIL, Take 1 capsule by mouth daily. 1069m,  Disp: , Rfl:  ?  fluticasone (FLONASE) 50 MCG/ACT nasal spray, Place 1 spray into both nostrils daily as needed for allergies or rhinitis., Disp: , Rfl:  ?  hydrochlorothiazide (HYDRODIURIL) 25 MG tablet, Take 1 tablet (25 mg total) by mouth daily., Disp: 90 tablet, Rfl: 2 ?  metoprolol tartrate (LOPRESSOR) 50 MG tablet, Take 1 tablet (50 mg total) by mouth 2 (two) times daily., Disp: 180 tablet, Rfl: 2 ?  olmesartan (BENICAR) 40 MG tablet, Take 1 tablet (40 mg total) by mouth daily., Disp: 90 tablet, Rfl: 2 ?  omeprazole (PRILOSEC OTC) 20 MG tablet, Take 20 mg by mouth daily. Bedtime, Disp: , Rfl:  ?  pravastatin (PRAVACHOL) 80 MG tablet, One tab po M-F, skip weekends, Disp: 75 tablet, Rfl: 2  ? ?Allergies  ?Allergen Reactions  ? Morphine And Related Rash  ?  ? ? ?The patient states she is post menopausal status.   No LMP recorded. Patient is postmenopausal.. Negative for Dysmenorrhea and Negative for Menorrhagia. Negative for: breast discharge, breast lump(s), breast pain and breast self exam. Associated symptoms include abnormal vaginal bleeding. Pertinent negatives include abnormal bleeding (hematology), anxiety, decreased libido, depression, difficulty falling sleep, dyspareunia, history of infertility, nocturia, sexual dysfunction, sleep disturbances, urinary incontinence, urinary urgency, vaginal discharge and vaginal itching. Diet regular; she tries to eat vegetables.  The patient states her exercise level is moderate daily with stretches. She has stairs in her home  and does her own house work.  ? ?The patient's tobacco use is:  ?Social History  ? ?Tobacco Use  ?Smoking Status Never  ?Smokeless Tobacco Never  ? ?She has been exposed to passive smoke. The patient's alcohol use is:  ?Social History  ? ?Substance and Sexual Activity  ?Alcohol Use No  ? ? ?Review of Systems  ?Constitutional: Negative.   ?HENT: Negative.    ?Eyes: Negative.   ?Respiratory: Negative.    ?Cardiovascular: Negative.    ?Gastrointestinal: Negative.   ?Endocrine: Negative.   ?Genitourinary: Negative.   ?Musculoskeletal: Negative.   ?Skin: Negative.   ?Allergic/Immunologic: Negative.   ?Neurological: Negative.   ?Hematological: Negative.   ?Psychiatric/Behavioral: Negative.     ? ?Today's Vitals  ? 07/22/21 1008  ?BP: 134/70  ?Pulse: 70  ?Temp: 98.7 ?F (37.1 ?C)  ?TempSrc: Oral  ?Weight: 133 lb 3.2 oz (60.4 kg)  ?Height: 5' 1.8" (1.57 m)  ? ?Body mass index is 24.52 kg/m?.  ?Wt Readings from Last 3 Encounters:  ?07/22/21 133 lb 3.2 oz (60.4 kg)  ?05/12/21 134 lb 12.8 oz (61.1 kg)  ?07/20/20 135 lb 9.6 oz (61.5 kg)  ? ? ?Objective:  ?Physical Exam ?Vitals reviewed.  ?Constitutional:   ?   General: She is not in acute distress. ?   Appearance: Normal appearance. She is well-developed.  ?HENT:  ?   Head: Normocephalic and atraumatic.  ?   Right Ear: Hearing, tympanic membrane, ear canal and external ear normal. There is no impacted cerumen.  ?   Left Ear: Hearing, tympanic membrane, ear canal and external ear normal. There is no impacted cerumen.  ?   Nose:  ?   Comments: Deferred - masked ?   Mouth/Throat:  ?   Comments: Deferred - masked ?Eyes:  ?   General: Lids are normal.  ?   Extraocular Movements: Extraocular movements intact.  ?   Conjunctiva/sclera: Conjunctivae normal.  ?   Pupils: Pupils are equal, round, and reactive to light.  ?   Funduscopic exam: ?   Right eye: No papilledema.     ?   Left eye: No papilledema.  ?Neck:  ?   Thyroid: No thyroid mass.  ?   Vascular: No carotid bruit.  ?Cardiovascular:  ?   Rate and Rhythm: Normal rate and regular rhythm.  ?   Pulses: Normal pulses.  ?   Heart sounds: Normal heart sounds. No murmur heard. ?Pulmonary:  ?   Effort: Pulmonary effort is normal. No respiratory distress.  ?   Breath sounds: Normal breath sounds. No wheezing.  ?Chest:  ?   Chest wall: No mass.  ?Breasts: ?   Tanner Score is 5.  ?   Right: Normal. No mass or tenderness.  ?   Left: Normal. No mass or tenderness.   ?Abdominal:  ?   General: Abdomen is flat. Bowel sounds are normal. There is no distension.  ?   Palpations: Abdomen is soft.  ?   Tenderness: There is no abdominal tenderness.  ?Genitourinary: ?   Rectum: Guaiac result negative.  ?Musculoskeletal:     ?   General: No swelling. Normal range of motion.  ?   Cervical back: Full passive range of motion without pain, normal range of motion and neck supple.  ?   Right lower leg: No edema.  ?   Left lower leg: No edema.  ?Lymphadenopathy:  ?   Upper Body:  ?   Right upper body: No supraclavicular, axillary or  pectoral adenopathy.  ?   Left upper body: No supraclavicular, axillary or pectoral adenopathy.  ?Skin: ?   General: Skin is warm and dry.  ?   Capillary Refill: Capillary refill takes less than 2 seconds.  ?Neurological:  ?   General: No focal deficit present.  ?   Mental Status: She is alert and oriented to person, place, and time.  ?   Cranial Nerves: No cranial nerve deficit.  ?   Sensory: No sensory deficit.  ?   Motor: No weakness.  ?Psychiatric:     ?   Mood and Affect: Mood normal.     ?   Behavior: Behavior normal.     ?   Thought Content: Thought content normal.     ?   Judgment: Judgment normal.  ?  ? ?   ?Assessment And Plan:  ?   ?1. Encounter for general adult medical examination w/o abnormal findings ?Behavior modifications discussed and diet history reviewed.   ?Pt will continue to exercise regularly and modify diet with low GI, plant based foods and decrease intake of processed foods.  ?Recommend intake of daily multivitamin, Vitamin D, and calcium.  ?Recommend mammogram and colonoscopy for preventive screenings, as well as recommend immunizations that include influenza, TDAP, and Shingles (will give at next visit had prevnar 20) ? ?2. Hypertensive nephropathy ?Comments: Blood pressure is well controlled, continue current medications ?- EKG 12-Lead ? ?3. Type 2 diabetes mellitus with stage 3 chronic kidney disease, without long-term current use of  insulin, unspecified whether stage 3a or 3b CKD ([place]) ?Comments: HgbA1c is stable and less than 7, no current medications.  ?- Hemoglobin A1c ? ?4. Pure hypercholesterolemia ?Comments: Tolerating statin well, cholesterol leve

## 2021-07-23 LAB — CMP14+EGFR
ALT: 13 IU/L (ref 0–32)
AST: 28 IU/L (ref 0–40)
Albumin/Globulin Ratio: 1.4 (ref 1.2–2.2)
Albumin: 4.7 g/dL — ABNORMAL HIGH (ref 3.6–4.6)
Alkaline Phosphatase: 57 IU/L (ref 44–121)
BUN/Creatinine Ratio: 12 (ref 12–28)
BUN: 13 mg/dL (ref 8–27)
Bilirubin Total: 0.7 mg/dL (ref 0.0–1.2)
CO2: 26 mmol/L (ref 20–29)
Calcium: 9.8 mg/dL (ref 8.7–10.3)
Chloride: 102 mmol/L (ref 96–106)
Creatinine, Ser: 1.06 mg/dL — ABNORMAL HIGH (ref 0.57–1.00)
Globulin, Total: 3.4 g/dL (ref 1.5–4.5)
Glucose: 116 mg/dL — ABNORMAL HIGH (ref 70–99)
Potassium: 3.5 mmol/L (ref 3.5–5.2)
Sodium: 141 mmol/L (ref 134–144)
Total Protein: 8.1 g/dL (ref 6.0–8.5)
eGFR: 51 mL/min/{1.73_m2} — ABNORMAL LOW (ref >59)

## 2021-07-23 LAB — LIPID PANEL
Chol/HDL Ratio: 3.6 ratio (ref 0.0–4.4)
Cholesterol, Total: 164 mg/dL (ref 100–199)
HDL: 45 mg/dL (ref >39)
LDL Chol Calc (NIH): 100 mg/dL — ABNORMAL HIGH (ref 0–99)
Triglycerides: 102 mg/dL (ref 0–149)
VLDL Cholesterol Cal: 19 mg/dL (ref 5–40)

## 2021-07-23 LAB — CBC
Hematocrit: 33.7 % — ABNORMAL LOW (ref 34.0–46.6)
Hemoglobin: 11.5 g/dL (ref 11.1–15.9)
MCH: 30.1 pg (ref 26.6–33.0)
MCHC: 34.1 g/dL (ref 31.5–35.7)
MCV: 88 fL (ref 79–97)
Platelets: 270 10*3/uL (ref 150–450)
RBC: 3.82 x10E6/uL (ref 3.77–5.28)
RDW: 12.1 % (ref 11.7–15.4)
WBC: 5 10*3/uL (ref 3.4–10.8)

## 2021-07-23 LAB — HEMOGLOBIN A1C
Est. average glucose Bld gHb Est-mCnc: 148 mg/dL
Hgb A1c MFr Bld: 6.8 % — ABNORMAL HIGH (ref 4.8–5.6)

## 2021-08-17 ENCOUNTER — Other Ambulatory Visit: Payer: Self-pay | Admitting: Nurse Practitioner

## 2021-08-17 MED ORDER — METFORMIN HCL 500 MG PO TABS: 500.0000 mg | ORAL_TABLET | Freq: Two times a day (BID) | ORAL | 2 refills | Status: DC

## 2021-08-19 ENCOUNTER — Other Ambulatory Visit: Payer: Self-pay

## 2021-08-19 ENCOUNTER — Telehealth: Payer: Self-pay

## 2021-08-19 MED ORDER — METFORMIN HCL 500 MG PO TABS: 500.0000 mg | ORAL_TABLET | Freq: Two times a day (BID) | ORAL | 2 refills | Status: DC

## 2021-08-19 NOTE — Chronic Care Management (AMB) (Signed)
Chronic Care Management Pharmacy Assistant   Name: Rebekah Avila  MRN: [id number] DOB: March 25, 1932  Reason for Encounter: Medication Review/ Medication Coordination  Recent office visits:  07-22-2021 [name], [place]. Hema= 33.7. Glucose= 116, Creatinine= 1.06, eGFR= 51, Albumin= 4.7. A1C= 6.8. LDL= 100. EKG completed. Tdap ordered. Pneumococcal injection given.  Recent consult visits:  None  Hospital visits:  None in previous 6 months  Medications: Outpatient Encounter Medications as of 08/19/2021  Medication Sig   ACCU-CHEK FASTCLIX LANCETS MISC by Does not apply route.   acetaminophen (TYLENOL) 500 MG tablet Take 1,000 mg by mouth every 6 (six) hours as needed for moderate pain.   aspirin EC 81 MG tablet Take 81 mg by mouth daily.   Blood Glucose Monitoring Suppl (ACCU-CHEK NANO SMARTVIEW) w/Device KIT 1 strip by Does not apply route.   Cholecalciferol (VITAMIN D) 2000 units CAPS Take 1 capsule by mouth daily.    Fish Oil OIL Take 1 capsule by mouth daily. 1069m   fluticasone (FLONASE) 50 MCG/ACT nasal spray Place 1 spray into both nostrils daily as needed for allergies or rhinitis.   hydrochlorothiazide (HYDRODIURIL) 25 MG tablet Take 1 tablet (25 mg total) by mouth daily.   metFORMIN (GLUCOPHAGE) 500 MG tablet Take 1 tablet (500 mg total) by mouth 2 (two) times daily with a meal.   metoprolol tartrate (LOPRESSOR) 50 MG tablet Take 1 tablet (50 mg total) by mouth 2 (two) times daily.   olmesartan (BENICAR) 40 MG tablet Take 1 tablet (40 mg total) by mouth daily.   omeprazole (PRILOSEC OTC) 20 MG tablet Take 20 mg by mouth daily. Bedtime   pravastatin (PRAVACHOL) 80 MG tablet One tab po M-F, skip weekends   No facility-administered encounter medications on file as of 08/19/2021.   Reviewed chart for medication changes ahead of medication coordination call.  No Consults, or hospital visits since last care coordination call/Pharmacist visit.   No medication changes  indicated OR if recent visit, treatment plan here.  BP Readings from Last 3 Encounters:  07/22/21 134/70  05/12/21 132/64  07/20/20 (!) 144/80    Lab Results  Component Value Date   HGBA1C 6.8 (H) 07/22/2021     Patient obtains medications through Adherence Packaging  90 Days   Last adherence delivery included:  Hydrochlorothiazide 25 mg- 1 tablet daily (breakfast) Olmesartan 40 mg- 1 tablet daily (breakfast) Pravastatin 80 mg- 1 tablet Monday- Friday, skip weekends(evening meal) Metoprolol Tartrate 50 mg- 1 tablet twice daily (breakfast and evening meal)  Patient declined (meds) last month: None  Patient is due for next adherence delivery on: 08-30-2021  Called patient and reviewed medications and coordinated delivery.  This delivery to include: Hydrochlorothiazide 25 mg- 1 tablet daily (breakfast) Olmesartan 40 mg- 1 tablet daily (breakfast) Pravastatin 80 mg- 1 tablet Monday- Friday, skip weekends(evening meal) Metoprolol Tartrate 50 mg- 1 tablet twice daily (breakfast and evening meal) Metformin 500 mg twice daily (breakfast and evening meal)  No acute/short fill needed  Patient declined the following medications: None  Patient needs refills for: Refill request sent Metformin- 90 DS  Confirmed delivery date of 08-30-2021 advised patient that pharmacy will contact them the morning of delivery.  Care Gaps: Shingrix overdue Tdap overdue last completed 07-07-2005 Yearly foot exam overdue last completed 04-17-2019 AWV 05-25-2022  Star Rating Drugs: Pravastatin 80 mg- Last filled 05-26-2021 [facility] Olmesartan 40 mg- Last filled 05-26-2021  [facility] Metformin 500 mg- Last filled 08-17-2021 14 DS [facility]  [place] Pharmacist Assistant [phone]

## 2021-10-28 ENCOUNTER — Ambulatory Visit: Payer: Medicare Other | Admitting: Nurse Practitioner

## 2021-11-16 ENCOUNTER — Encounter: Payer: Self-pay | Admitting: Nurse Practitioner

## 2021-11-16 ENCOUNTER — Ambulatory Visit (INDEPENDENT_AMBULATORY_CARE_PROVIDER_SITE_OTHER): Payer: Medicare Other | Admitting: Nurse Practitioner

## 2021-11-16 ENCOUNTER — Telehealth: Payer: Self-pay

## 2021-11-16 VITALS — BP 110/80 | HR 77 | Temp 98.1°F | Ht 61.0 in | Wt 114.0 lb

## 2021-11-16 DIAGNOSIS — E7800 Pure hypercholesterolemia, unspecified: Secondary | ICD-10-CM | POA: Diagnosis not present

## 2021-11-16 DIAGNOSIS — I129 Hypertensive chronic kidney disease with stage 1 through stage 4 chronic kidney disease, or unspecified chronic kidney disease: Secondary | ICD-10-CM | POA: Diagnosis not present

## 2021-11-16 DIAGNOSIS — N183 Chronic kidney disease, stage 3 unspecified: Secondary | ICD-10-CM | POA: Diagnosis not present

## 2021-11-16 DIAGNOSIS — E1122 Type 2 diabetes mellitus with diabetic chronic kidney disease: Secondary | ICD-10-CM

## 2021-11-16 DIAGNOSIS — M353 Polymyalgia rheumatica: Secondary | ICD-10-CM

## 2021-11-16 DIAGNOSIS — Z79899 Other long term (current) drug therapy: Secondary | ICD-10-CM

## 2021-11-16 DIAGNOSIS — R5382 Chronic fatigue, unspecified: Secondary | ICD-10-CM

## 2021-11-16 MED ORDER — METFORMIN HCL 500 MG PO TABS
500.0000 mg | ORAL_TABLET | Freq: Every day | ORAL | 2 refills | Status: DC
Start: 2021-11-16 — End: 2021-11-17

## 2021-11-16 NOTE — Progress Notes (Signed)
I,Victoria T Hamilton,acting as a Education administrator for [name], FNP.,have documented all relevant documentation on the behalf of [name], FNP,as directed by  [name], FNP while in the presence of [name], [place].    Subjective:     Patient ID: Rebekah Avila , female    DOB: June 23, 1931 , 88 y.o.   MRN: [id number]   Chief Complaint  Patient presents with   Diabetes    HPI  Pt presents today for DM f/u.  She reports the metformin recently just sent in, giving her low blood sugar readings. Reports the metformin will cause her some jitteriness, light headed.   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 She has type 2 diabetes mellitus. There are no hypoglycemic associated symptoms. There are no diabetic associated symptoms. Pertinent negatives for diabetes include no chest pain. There are no hypoglycemic complications. There are no diabetic complications. Current diabetic treatment includes oral agent (monotherapy). (Reports blood sugar has been in normal range has had some low blood sugars but not too low to cause "passing out".)     Past Medical History:  Diagnosis Date   Diabetes ([place])    High cholesterol    Hypertension    Polymyalgia rheumatica ([facility])      Family History  Problem Relation Age of Onset   Healthy Mother    Heart attack Father      Current Outpatient Medications:    ACCU-CHEK FASTCLIX LANCETS MISC, by Does not apply route., Disp: , Rfl:    acetaminophen (TYLENOL) 500 MG tablet, Take 1,000 mg by mouth every 6 (six) hours as needed for moderate pain., Disp: , Rfl:    aspirin EC 81 MG tablet, Take 81 mg by mouth daily., Disp: , Rfl:    Blood Glucose Monitoring Suppl (ACCU-CHEK NANO SMARTVIEW) w/Device KIT, 1 strip by Does not apply route., Disp: , Rfl:    Cholecalciferol (VITAMIN D) 2000 units CAPS, Take 1 capsule by mouth daily. , Disp: , Rfl:    Fish Oil OIL, Take 1 capsule by  mouth daily. 1058m, Disp: , Rfl:    fluticasone (FLONASE) 50 MCG/ACT nasal spray, Place 1 spray into both nostrils daily as needed for allergies or rhinitis., Disp: , Rfl:    hydrochlorothiazide (HYDRODIURIL) 25 MG tablet, Take 1 tablet (25 mg total) by mouth daily., Disp: 90 tablet, Rfl: 2   metoprolol tartrate (LOPRESSOR) 50 MG tablet, Take 1 tablet (50 mg total) by mouth 2 (two) times daily., Disp: 180 tablet, Rfl: 2   olmesartan (BENICAR) 40 MG tablet, Take 1 tablet (40 mg total) by mouth daily., Disp: 90 tablet, Rfl: 2   omeprazole (PRILOSEC OTC) 20 MG tablet, Take 20 mg by mouth daily. Bedtime, Disp: , Rfl:    pravastatin (PRAVACHOL) 80 MG tablet, One tab po M-F, skip weekends, Disp: 75 tablet, Rfl: 2   metFORMIN (GLUCOPHAGE) 500 MG tablet, Take 1 tablet (500 mg total) by mouth daily with breakfast., Disp: 90 tablet, Rfl: 2   Allergies  Allergen Reactions   Morphine And Related Rash     Review of Systems  Constitutional: Negative.   Respiratory: Negative.    Cardiovascular: Negative.  Negative for chest pain, palpitations and leg swelling.  Neurological: Negative.   Psychiatric/Behavioral: Negative.       Today's Vitals   11/16/21 1135  BP: 110/80  Pulse: 77  Temp: 98.1 F (36.7 C)  SpO2: 98%  Weight: 114 lb (51.7 kg)  Height: _0  (1.549 m)  PainSc: 0-No pain   Body mass index is 21.54 kg/m.  Wt Readings from Last 3 Encounters:  11/16/21 114 lb (51.7 kg)  07/22/21 133 lb 3.2 oz (60.4 kg)  05/12/21 134 lb 12.8 oz (61.1 kg)    Objective:  Physical Exam Vitals reviewed.  Constitutional:      General: She is not in acute distress.    Appearance: Normal appearance. She is well-developed.  Cardiovascular:     Rate and Rhythm: Normal rate and regular rhythm.     Pulses: Normal pulses.     Heart sounds: Normal heart sounds. No murmur heard. Pulmonary:     Effort: Pulmonary effort is normal. No respiratory distress.     Breath sounds: Normal breath sounds. No  wheezing.  Chest:     Chest wall: No tenderness.  Skin:    General: Skin is warm and dry.     Capillary Refill: Capillary refill takes less than 2 seconds.  Neurological:     General: No focal deficit present.     Mental Status: She is alert and oriented to person, place, and time.     Cranial Nerves: No cranial nerve deficit.     Motor: No weakness.  Psychiatric:        Mood and Affect: Mood normal.        Behavior: Behavior normal.        Thought Content: Thought content normal.        Judgment: Judgment normal.         Assessment And Plan:     1. Type 2 diabetes mellitus with stage 3 chronic kidney disease, without long-term current use of insulin, unspecified whether stage 3a or 3b CKD ([place]) Comments: HgbA1c is improving, continue current medications - CMP14+EGFR - Hemoglobin A1c  2. Hypertensive nephropathy - CMP14+EGFR  3. Pure hypercholesterolemia Comments: Stable, continue statin, tolerating medications well.  - CMP14+EGFR - Lipid panel  4. Polymyalgia rheumatica ([facility]) Comments: Continue follow up with Dr. Estanislado Pandy  5. Chronic fatigue Comments: Will check iron studies and vitamin B and vitamin D - VITAMIN D 25 Hydroxy (Vit-D Deficiency, Fractures) - Vitamin B12 - Iron, TIBC and Ferritin Panel  6. Other long term (current) drug therapy - CBC     Patient was given opportunity to ask questions. Patient verbalized understanding of the plan and was able to repeat key elements of the plan. All questions were answered to their satisfaction.  [name], FNP   I, [name], FNP, have reviewed all documentation for this visit. The documentation on 11/16/21 for the exam, diagnosis, procedures, and orders are all accurate and complete.   IF YOU HAVE BEEN REFERRED TO A SPECIALIST, IT MAY TAKE 1-2 WEEKS TO SCHEDULE/PROCESS THE REFERRAL. IF YOU HAVE NOT HEARD FROM US/SPECIALIST IN TWO WEEKS, PLEASE GIVE [place] A CALL AT [phone] X [phone].   THE PATIENT IS  ENCOURAGED TO PRACTICE SOCIAL DISTANCING DUE TO THE COVID-19 PANDEMIC.

## 2021-11-16 NOTE — Chronic Care Management (AMB) (Signed)
    Chronic Care Management Pharmacy Assistant   Name: Rebekah Avila  MRN: [id number] DOB: [birth year]  Reason for Encounter: Medication Review/ Medication coordination   Recent office visits:  11-16-2021 [name], [place]. DECREASE metformin 500 mg twice daily TO 500 mg daily.  Recent consult visits:  None  Hospital visits:  None in previous 6 months  Medications: Outpatient Encounter Medications as of 11/16/2021  Medication Sig   ACCU-CHEK FASTCLIX LANCETS MISC by Does not apply route.   acetaminophen (TYLENOL) 500 MG tablet Take 1,000 mg by mouth every 6 (six) hours as needed for moderate pain.   aspirin EC 81 MG tablet Take 81 mg by mouth daily.   Blood Glucose Monitoring Suppl (ACCU-CHEK NANO SMARTVIEW) w/Device KIT 1 strip by Does not apply route.   Cholecalciferol (VITAMIN D) 2000 units CAPS Take 1 capsule by mouth daily.    Fish Oil OIL Take 1 capsule by mouth daily. 1071m   fluticasone (FLONASE) 50 MCG/ACT nasal spray Place 1 spray into both nostrils daily as needed for allergies or rhinitis.   hydrochlorothiazide (HYDRODIURIL) 25 MG tablet Take 1 tablet (25 mg total) by mouth daily.   metFORMIN (GLUCOPHAGE) 500 MG tablet Take 1 tablet (500 mg total) by mouth 2 (two) times daily with a meal.   metoprolol tartrate (LOPRESSOR) 50 MG tablet Take 1 tablet (50 mg total) by mouth 2 (two) times daily.   olmesartan (BENICAR) 40 MG tablet Take 1 tablet (40 mg total) by mouth daily.   omeprazole (PRILOSEC OTC) 20 MG tablet Take 20 mg by mouth daily. Bedtime   pravastatin (PRAVACHOL) 80 MG tablet One tab po M-F, skip weekends   No facility-administered encounter medications on file as of 11/16/2021.  Reviewed chart for medication changes ahead of medication coordination call.  No Consults, or hospital visits since last care coordination call/Pharmacist visit.   No medication changes indicated   BP Readings from Last 3 Encounters:  11/16/21 110/80  07/22/21 134/70  05/12/21  132/64    Lab Results  Component Value Date   HGBA1C 6.8 (H) 07/22/2021     Patient obtains medications through Adherence Packaging  90 Days   Last adherence delivery included:  Hydrochlorothiazide 25 mg- 1 tablet daily (breakfast) Olmesartan 40 mg- 1 tablet daily (breakfast) Pravastatin 80 mg- 1 tablet Monday- Friday, skip weekends(evening meal) Metoprolol Tartrate 50 mg- 1 tablet twice daily (breakfast and evening meal) Metformin 500 mg twice daily (breakfast and evening meal)  Patient declined (meds) last month: None  Patient is due for next adherence delivery on: 11-26-2021  Called patient and reviewed medications and coordinated delivery.  This delivery to include: HCTZ 25 mg at breakfast Metoprolol 50 mg at breakfast and evening meal Olmesartan 40 mg at breakfast Metformin 500 mg at breakfast Pravastatin 80 mg with evening meals M-F  No acute/short fill needed  Patient declined the following medications: None  Patient needs refills for: None  Confirmed delivery date of 11-26-2021 advised patient that pharmacy will contact them the morning of delivery.  Care Gaps: None  Star Rating Drugs: Olmesartan 40 mg- Last filled 08-23-2021 [facility] Pravastatin 80 mg- Last filled 08-23-2021 [facility] Metformin 500 mg- Last filled 08-23-2021 [facility]  MFred[place]Pharmacist Assistant [phone]

## 2021-11-17 ENCOUNTER — Other Ambulatory Visit: Payer: Self-pay

## 2021-11-17 ENCOUNTER — Other Ambulatory Visit: Payer: Self-pay | Admitting: Nurse Practitioner

## 2021-11-17 DIAGNOSIS — E7800 Pure hypercholesterolemia, unspecified: Secondary | ICD-10-CM

## 2021-11-17 DIAGNOSIS — M353 Polymyalgia rheumatica: Secondary | ICD-10-CM

## 2021-11-17 DIAGNOSIS — E1122 Type 2 diabetes mellitus with diabetic chronic kidney disease: Secondary | ICD-10-CM

## 2021-11-17 DIAGNOSIS — I129 Hypertensive chronic kidney disease with stage 1 through stage 4 chronic kidney disease, or unspecified chronic kidney disease: Secondary | ICD-10-CM

## 2021-11-17 LAB — LIPID PANEL
Chol/HDL Ratio: 4.1 ratio (ref 0.0–4.4)
Cholesterol, Total: 149 mg/dL (ref 100–199)
HDL: 36 mg/dL — ABNORMAL LOW (ref >39)
LDL Chol Calc (NIH): 96 mg/dL (ref 0–99)
Triglycerides: 89 mg/dL (ref 0–149)
VLDL Cholesterol Cal: 17 mg/dL (ref 5–40)

## 2021-11-17 LAB — CMP14+EGFR
ALT: 14 IU/L (ref 0–32)
AST: 29 IU/L (ref 0–40)
Albumin/Globulin Ratio: 1.4 (ref 1.2–2.2)
Albumin: 4.6 g/dL (ref 3.7–4.7)
Alkaline Phosphatase: 39 IU/L — ABNORMAL LOW (ref 44–121)
BUN/Creatinine Ratio: 21 (ref 12–28)
BUN: 36 mg/dL — ABNORMAL HIGH (ref 8–27)
Bilirubin Total: 0.7 mg/dL (ref 0.0–1.2)
CO2: 20 mmol/L (ref 20–29)
Calcium: 10.6 mg/dL — ABNORMAL HIGH (ref 8.7–10.3)
Chloride: 101 mmol/L (ref 96–106)
Creatinine, Ser: 1.71 mg/dL — ABNORMAL HIGH (ref 0.57–1.00)
Globulin, Total: 3.4 g/dL (ref 1.5–4.5)
Glucose: 113 mg/dL — ABNORMAL HIGH (ref 70–99)
Potassium: 3.7 mmol/L (ref 3.5–5.2)
Sodium: 140 mmol/L (ref 134–144)
Total Protein: 8 g/dL (ref 6.0–8.5)
eGFR: 29 mL/min/{1.73_m2} — ABNORMAL LOW (ref >59)

## 2021-11-17 LAB — IRON,TIBC AND FERRITIN PANEL
Ferritin: 215 ng/mL — ABNORMAL HIGH (ref 15–150)
Iron Saturation: 34 % (ref 15–55)
Iron: 85 ug/dL (ref 27–139)
Total Iron Binding Capacity: 251 ug/dL (ref 250–450)
UIBC: 166 ug/dL (ref 118–369)

## 2021-11-17 LAB — VITAMIN D 25 HYDROXY (VIT D DEFICIENCY, FRACTURES): Vit D, 25-Hydroxy: 59.6 ng/mL (ref 30.0–100.0)

## 2021-11-17 LAB — CBC
Hematocrit: 32.4 % — ABNORMAL LOW (ref 34.0–46.6)
Hemoglobin: 11 g/dL — ABNORMAL LOW (ref 11.1–15.9)
MCH: 29.9 pg (ref 26.6–33.0)
MCHC: 34 g/dL (ref 31.5–35.7)
MCV: 88 fL (ref 79–97)
Platelets: 228 10*3/uL (ref 150–450)
RBC: 3.68 x10E6/uL — ABNORMAL LOW (ref 3.77–5.28)
RDW: 12.7 % (ref 11.7–15.4)
WBC: 4.3 10*3/uL (ref 3.4–10.8)

## 2021-11-17 LAB — HEMOGLOBIN A1C
Est. average glucose Bld gHb Est-mCnc: 140 mg/dL
Hgb A1c MFr Bld: 6.5 % — ABNORMAL HIGH (ref 4.8–5.6)

## 2021-11-17 LAB — VITAMIN B12: Vitamin B-12: >2000 pg/mL — ABNORMAL HIGH (ref 232–1245)

## 2021-11-21 ENCOUNTER — Emergency Department (HOSPITAL_COMMUNITY): Payer: Medicare Other

## 2021-11-21 ENCOUNTER — Encounter (HOSPITAL_COMMUNITY): Payer: Self-pay

## 2021-11-21 ENCOUNTER — Observation Stay (HOSPITAL_COMMUNITY)
Admission: EM | Admit: 2021-11-21 | Discharge: 2021-11-22 | Disposition: A | Discharge status: Home / Self Care | Payer: Medicare Other | Attending: Internal Medicine | Admitting: Internal Medicine

## 2021-11-21 ENCOUNTER — Other Ambulatory Visit: Payer: Self-pay

## 2021-11-21 DIAGNOSIS — E8809 Other disorders of plasma-protein metabolism, not elsewhere classified: Secondary | ICD-10-CM | POA: Insufficient documentation

## 2021-11-21 DIAGNOSIS — E119 Type 2 diabetes mellitus without complications: Secondary | ICD-10-CM | POA: Insufficient documentation

## 2021-11-21 DIAGNOSIS — R9082 White matter disease, unspecified: Secondary | ICD-10-CM | POA: Diagnosis not present

## 2021-11-21 DIAGNOSIS — E872 Acidosis, unspecified: Secondary | ICD-10-CM | POA: Diagnosis not present

## 2021-11-21 DIAGNOSIS — D500 Iron deficiency anemia secondary to blood loss (chronic): Secondary | ICD-10-CM | POA: Diagnosis not present

## 2021-11-21 DIAGNOSIS — E785 Hyperlipidemia, unspecified: Secondary | ICD-10-CM | POA: Diagnosis present

## 2021-11-21 DIAGNOSIS — I10 Essential (primary) hypertension: Secondary | ICD-10-CM | POA: Diagnosis present

## 2021-11-21 DIAGNOSIS — D649 Anemia, unspecified: Secondary | ICD-10-CM | POA: Diagnosis present

## 2021-11-21 DIAGNOSIS — E8729 Other acidosis: Secondary | ICD-10-CM | POA: Diagnosis present

## 2021-11-21 DIAGNOSIS — I6782 Cerebral ischemia: Secondary | ICD-10-CM | POA: Diagnosis not present

## 2021-11-21 DIAGNOSIS — E876 Hypokalemia: Secondary | ICD-10-CM | POA: Diagnosis not present

## 2021-11-21 DIAGNOSIS — I129 Hypertensive chronic kidney disease with stage 1 through stage 4 chronic kidney disease, or unspecified chronic kidney disease: Secondary | ICD-10-CM | POA: Diagnosis not present

## 2021-11-21 DIAGNOSIS — N179 Acute kidney failure, unspecified: Principal | ICD-10-CM | POA: Insufficient documentation

## 2021-11-21 DIAGNOSIS — Z79899 Other long term (current) drug therapy: Secondary | ICD-10-CM | POA: Insufficient documentation

## 2021-11-21 DIAGNOSIS — R000 Tachycardia, unspecified: Secondary | ICD-10-CM | POA: Diagnosis not present

## 2021-11-21 DIAGNOSIS — R4182 Altered mental status, unspecified: Secondary | ICD-10-CM | POA: Diagnosis not present

## 2021-11-21 DIAGNOSIS — R42 Dizziness and giddiness: Secondary | ICD-10-CM | POA: Diagnosis not present

## 2021-11-21 DIAGNOSIS — Z7982 Long term (current) use of aspirin: Secondary | ICD-10-CM | POA: Diagnosis not present

## 2021-11-21 DIAGNOSIS — N1831 Chronic kidney disease, stage 3a: Secondary | ICD-10-CM | POA: Diagnosis present

## 2021-11-21 HISTORY — DX: Disorder of kidney and ureter, unspecified: N28.9

## 2021-11-21 LAB — COMPREHENSIVE METABOLIC PANEL
ALT: 21 U/L (ref 0–44)
AST: 34 U/L (ref 15–41)
Albumin: 4.8 g/dL (ref 3.5–5.0)
Alkaline Phosphatase: 37 U/L — ABNORMAL LOW (ref 38–126)
Anion gap: 18 — ABNORMAL HIGH (ref 5–15)
BUN: 44 mg/dL — ABNORMAL HIGH (ref 8–23)
CO2: 20 mmol/L — ABNORMAL LOW (ref 22–32)
Calcium: 10.3 mg/dL (ref 8.9–10.3)
Chloride: 103 mmol/L (ref 98–111)
Creatinine, Ser: 1.72 mg/dL — ABNORMAL HIGH (ref 0.44–1.00)
GFR, Estimated: 28 mL/min — ABNORMAL LOW (ref >60)
Glucose, Bld: 126 mg/dL — ABNORMAL HIGH (ref 70–99)
Potassium: 3.3 mmol/L — ABNORMAL LOW (ref 3.5–5.1)
Sodium: 141 mmol/L (ref 135–145)
Total Bilirubin: 1.3 mg/dL — ABNORMAL HIGH (ref 0.3–1.2)
Total Protein: 9.4 g/dL — ABNORMAL HIGH (ref 6.5–8.1)

## 2021-11-21 LAB — CBC WITH DIFFERENTIAL/PLATELET
Abs Immature Granulocytes: 0.01 10*3/uL (ref 0.00–0.07)
Basophils Absolute: 0 10*3/uL (ref 0.0–0.1)
Basophils Relative: 1 %
Eosinophils Absolute: 0 10*3/uL (ref 0.0–0.5)
Eosinophils Relative: 0 %
HCT: 35.7 % — ABNORMAL LOW (ref 36.0–46.0)
Hemoglobin: 11.9 g/dL — ABNORMAL LOW (ref 12.0–15.0)
Immature Granulocytes: 0 %
Lymphocytes Relative: 27 %
Lymphs Abs: 1.8 10*3/uL (ref 0.7–4.0)
MCH: 30.1 pg (ref 26.0–34.0)
MCHC: 33.3 g/dL (ref 30.0–36.0)
MCV: 90.4 fL (ref 80.0–100.0)
Monocytes Absolute: 0.7 10*3/uL (ref 0.1–1.0)
Monocytes Relative: 11 %
Neutro Abs: 4 10*3/uL (ref 1.7–7.7)
Neutrophils Relative %: 61 %
Platelets: 264 10*3/uL (ref 150–400)
RBC: 3.95 MIL/uL (ref 3.87–5.11)
RDW: 12.5 % (ref 11.5–15.5)
WBC: 6.5 10*3/uL (ref 4.0–10.5)
nRBC: 0 % (ref 0.0–0.2)

## 2021-11-21 LAB — CBG MONITORING, ED
Glucose-Capillary: 121 mg/dL — ABNORMAL HIGH (ref 70–99)
Glucose-Capillary: 96 mg/dL (ref 70–99)

## 2021-11-21 LAB — TROPONIN I (HIGH SENSITIVITY): Troponin I (High Sensitivity): 9 ng/L (ref <18)

## 2021-11-21 LAB — GLUCOSE, CAPILLARY: Glucose-Capillary: 137 mg/dL — ABNORMAL HIGH (ref 70–99)

## 2021-11-21 MED ORDER — HYDRALAZINE HCL 20 MG/ML IJ SOLN
10.0000 mg | Freq: Once | INTRAMUSCULAR | Status: AC
  Administered 2021-11-21: 10 mg via INTRAVENOUS
  Filled 2021-11-21: qty 1

## 2021-11-21 MED ORDER — ONDANSETRON HCL 4 MG/2ML IJ SOLN: 4.0000 mg | Freq: Four times a day (QID) | INTRAMUSCULAR | Status: DC | PRN

## 2021-11-21 MED ORDER — LACTATED RINGERS IV SOLN: INTRAVENOUS | Status: DC

## 2021-11-21 MED ORDER — ACETAMINOPHEN 325 MG PO TABS: 650.0000 mg | ORAL_TABLET | Freq: Four times a day (QID) | ORAL | Status: DC | PRN

## 2021-11-21 MED ORDER — POTASSIUM CHLORIDE CRYS ER 20 MEQ PO TBCR
20.0000 meq | EXTENDED_RELEASE_TABLET | Freq: Once | ORAL | Status: AC
Start: 2021-11-21 — End: 2021-11-21
  Administered 2021-11-21: 20 meq via ORAL
  Filled 2021-11-21: qty 1

## 2021-11-21 MED ORDER — PANTOPRAZOLE SODIUM 40 MG PO TBEC
40.0000 mg | DELAYED_RELEASE_TABLET | Freq: Every day | ORAL | Status: DC
  Administered 2021-11-21 – 2021-11-22 (×2): 40 mg via ORAL
  Filled 2021-11-21 (×2): qty 1

## 2021-11-21 MED ORDER — ACETAMINOPHEN 650 MG RE SUPP: 650.0000 mg | Freq: Four times a day (QID) | RECTAL | Status: DC | PRN

## 2021-11-21 MED ORDER — PRAVASTATIN SODIUM 20 MG PO TABS
80.0000 mg | ORAL_TABLET | ORAL | Status: DC
  Administered 2021-11-22: 80 mg via ORAL
  Filled 2021-11-21: qty 4

## 2021-11-21 MED ORDER — ASPIRIN 81 MG PO TBEC
81.0000 mg | DELAYED_RELEASE_TABLET | Freq: Every day | ORAL | Status: DC
  Administered 2021-11-22: 81 mg via ORAL
  Filled 2021-11-21: qty 1

## 2021-11-21 MED ORDER — ONDANSETRON HCL 4 MG PO TABS: 4.0000 mg | ORAL_TABLET | Freq: Four times a day (QID) | ORAL | Status: DC | PRN

## 2021-11-21 MED ORDER — LACTATED RINGERS IV BOLUS
500.0000 mL | Freq: Once | INTRAVENOUS | Status: AC
  Administered 2021-11-21: 500 mL via INTRAVENOUS

## 2021-11-21 MED ORDER — METOPROLOL TARTRATE 25 MG PO TABS
50.0000 mg | ORAL_TABLET | Freq: Two times a day (BID) | ORAL | Status: DC
Start: 2021-11-21 — End: 2021-11-22
  Administered 2021-11-21 – 2021-11-22 (×2): 50 mg via ORAL
  Filled 2021-11-21 (×2): qty 2

## 2021-11-21 MED ORDER — SODIUM CHLORIDE 0.45 % IV SOLN: INTRAVENOUS | Status: DC

## 2021-11-21 MED ORDER — AMLODIPINE BESYLATE 5 MG PO TABS
2.5000 mg | ORAL_TABLET | Freq: Every day | ORAL | Status: DC
  Administered 2021-11-21 – 2021-11-22 (×2): 2.5 mg via ORAL
  Filled 2021-11-21 (×2): qty 1

## 2021-11-21 NOTE — ED Triage Notes (Signed)
Patient c/o dizziness since last week.   Patient denies any CP, SOB, LOC.

## 2021-11-21 NOTE — ED Provider Notes (Signed)
[facility] DEPT Provider Note   CSN: [id number] Arrival date & time: 11/21/21  1307     History  Chief Complaint  Patient presents with   Dizziness    Rebekah Avila is a 88 y.o. female.  HPI 88 year old female presents with lightheadedness and concern for low blood sugar.  History is initially from patient and later from the niece who is at the bedside.  Patient seems to have had lightheadedness for a full week according to the niece.  Patient states that she was first prescribed metformin a few days ago when she saw doctor this past week.  She is concerned that her glucoses have been going low and that the lightheadedness she gets is directly after taking metformin.  However she cannot show me any actual hypoglycemia numbers on the records she keeps.  She is very fixated on the metformin and lightheadedness.  She is very vague on when the lightheadedness occurs and is not currently present.  She denies any passing out or trouble walking.  Niece indicates that the patient seems like she is more confused over the last week or so, primarily forgetful about metformin and why this was prescribed as the patient thinks this was inappropriately prescribed to her.    Home Medications Prior to Admission medications   Medication Sig Start Date End Date Taking? Authorizing Provider  ACCU-CHEK FASTCLIX LANCETS MISC by Does not apply route.    [provider]  acetaminophen (TYLENOL) 500 MG tablet Take 1,000 mg by mouth every 6 (six) hours as needed for moderate pain.    [provider]  aspirin EC 81 MG tablet Take 81 mg by mouth daily.    [provider]  Blood Glucose Monitoring Suppl (ACCU-CHEK NANO SMARTVIEW) w/Device KIT 1 strip by Does not apply route.    [provider]  Cholecalciferol (VITAMIN D) 2000 units CAPS Take 1 capsule by mouth daily.     [provider]  Fish Oil OIL Take 1 capsule by mouth daily. 1024m     [provider]  fluticasone (FLONASE) 50 MCG/ACT nasal spray Place 1 spray into both nostrils daily as needed for allergies or rhinitis.    [provider]  hydrochlorothiazide (HYDRODIURIL) 25 MG tablet Take 1 tablet (25 mg total) by mouth daily. 05/21/21   MMinette Brine FNP  metoprolol tartrate (LOPRESSOR) 50 MG tablet Take 1 tablet (50 mg total) by mouth 2 (two) times daily. 05/21/21   MMinette Brine FNP  olmesartan (BENICAR) 40 MG tablet Take 1 tablet (40 mg total) by mouth daily. 05/21/21   MMinette Brine FNP  omeprazole (PRILOSEC OTC) 20 MG tablet Take 20 mg by mouth daily. Bedtime    [provider]  pravastatin (PRAVACHOL) 80 MG tablet One tab po M-F, skip weekends 05/21/21   MMinette Brine FNP      Allergies    Morphine and related    Review of Systems   Review of Systems  Constitutional:  Negative for fever.  Respiratory:  Negative for shortness of breath.   Cardiovascular:  Negative for chest pain.  Gastrointestinal:  Negative for abdominal pain and vomiting.  Genitourinary:  Negative for dysuria.  Neurological:  Positive for light-headedness. Negative for headaches.    Physical Exam Updated Vital Signs BP (!) 157/84 (BP Location: Right Arm)   Pulse (!) 108   Temp 98.7 F (37.1 C) (Oral)   Resp 16   Ht '5\' 1"'  (1.549 m)   Wt 51.7  kg   SpO2 100%   BMI 21.54 kg/m  Physical Exam Vitals and nursing note reviewed.  Constitutional:      Appearance: She is well-developed.  HENT:     Head: Normocephalic and atraumatic.     Mouth/Throat:     Mouth: Mucous membranes are dry.  Cardiovascular:     Rate and Rhythm: Regular rhythm. Tachycardia present.     Heart sounds: Normal heart sounds.  Pulmonary:     Effort: Pulmonary effort is normal.     Breath sounds: Normal breath sounds.  Abdominal:     General: There is no distension.     Palpations: Abdomen is soft.     Tenderness: There is no abdominal tenderness.  Musculoskeletal:     Cervical  back: No rigidity.  Skin:    General: Skin is warm and dry.  Neurological:     Mental Status: She is alert. She is disoriented.     Comments: Oriented to self and place, disoriented to time. CN 3-12 grossly intact. 5/5 strength in all 4 extremities. Grossly normal sensation. Normal finger to nose.      ED Results / Procedures / Treatments   Labs (all labs ordered are listed, but only abnormal results are displayed) Labs Reviewed  CBC WITH DIFFERENTIAL/PLATELET  COMPREHENSIVE METABOLIC PANEL  URINALYSIS, ROUTINE W REFLEX MICROSCOPIC  CBG MONITORING, ED    EKG EKG Interpretation  Date/Time:  Sunday November 21 2021 13:25:19 EDT Ventricular Rate:  102 PR Interval:  180 QRS Duration: 87 QT Interval:  336 QTC Calculation: 438 R Axis:   19 Text Interpretation: Sinus tachycardia Low voltage, precordial leads Abnormal R-wave progression, early transition Borderline repolarization abnormality overall similar to Mar 2015, though data quality today is poor Confirmed by Sherwood Gambler [phone]) on 11/21/2021 3:06:03 PM  Radiology No results found.  Procedures Procedures    Medications Ordered in ED Medications  lactated ringers bolus 500 mL (has no administration in time range)    ED Course/ Medical Decision Making/ A&P                           Medical Decision Making Amount and/or Complexity of Data Reviewed Independent Historian:     Details: Niece provides further history that it seems like she is a little confused, primarily and that she does not remember why she was prescribed metformin. Labs:     Details: Normal WBC.  Slight hyperglycemia but not concerning.  Creatinine is 1.7 which is similar to 5 days ago when she also had symptoms but higher than baseline significantly.  BUN of 44 suggests hypovolemia. Radiology: independent interpretation performed.    Details: CT head without acute head bleed, no pneumonia ECG/medicine tests: independent interpretation performed.     Details: Nonspecific ST changes, could be rate related but seem to be new, but most recent EKG was 8 years ago  Risk Prescription drug management.   I suspect patient's lightheadedness is all coming from acute kidney injury as above.  She was given a 500 cc IV fluid bolus and we will start her on a rate.  My suspicion of acute delirium is a little lower having looked at the notes from her PCP.  In March 2022 there was an indication that she had memory changes back then.  I am less suspicious of an acute CNS emergency.  Will check urine as well but at this point I think she needs to be admitted for some  fluids and recheck her creatinine.  Discussed with Dr. Olevia Bowens who will admit.        Final Clinical Impression(s) / ED Diagnoses Final diagnoses:  Acute kidney injury [facility])    Rx / DC Orders ED Discharge Orders     None         Sherwood Gambler, MD 11/21/21 1642

## 2021-11-21 NOTE — ED Notes (Signed)
When reviewing the chart patient went to doctor march 30 and Dr wrote that patients HGB A1C was less than 7 and no need for medication.  Patient did not go back to doctor in April and on April 25 Dr ordered metformin 500mg  BID and patient was not told about this but received medication in the mail.  Then on 4/27 the order was discontinued without patient knowledge.  Then patient starting having symptoms of dizziness fatigue in July and went to Dr July 25 and had bloodwork which still revealed her HGB A1C less than 7 and Dr changed Metformin to 500mg  qd.  Patient still was not aware of this change or as to why she needed to be taking Metformin.  In the chart in March labs were mostly normal other than creat 1.14 and then 1.06 7/25 but now has elevated BUN creat and decreased GFR.  Patient still does not believe she needs to be on this medication.  Patient reports she has never been diagnosed with diabetes only pre diabetic.

## 2021-11-21 NOTE — ED Notes (Signed)
Kendal Hymen RN from floor accepted patient but does not know how to turn purple man green

## 2021-11-21 NOTE — H&P (Signed)
History and Physical    Patient: Rebekah Avila YYT:[id number] DOB: 1931/06/08 DOA: 11/21/2021 DOS: the patient was seen and examined on 11/21/2021 PCP: [name], [place]  Patient coming from: Home  Chief Complaint:  Chief Complaint  Patient presents with   Dizziness   HPI: Rebekah Avila is a 88 y.o. female with medical history significant of diet-controlled type 2 diabetes, hyperlipidemia, hypertension, polymyalgia rheumatica, stage 3a CKD due to hypertensive kidney disease who presented to the emergency department complaints of dizziness since last week after she took newly prescribed metformin by her PCP.  She believes that she has been having hypoglycemia, but does not have any hypoglycemic numbers on her records.  She has been eating and has not been diaphoretic.  However, her renal function seems to be off from baseline and she has a mild decrease in CO2 with an anion gap of 18.  She was unable to specify whether her lightheadedness was postural and not.  She denied fever, chills, rhinorrhea, sore throat, wheezing or hemoptysis.  No chest pain, palpitations, diaphoresis, PND, orthopnea or pitting edema of the lower extremities.  No abdominal pain, nausea, emesis, diarrhea, constipation, melena or hematochezia.  No flank pain, dysuria, frequency or hematuria.  No polyuria, polydipsia, polyphagia or blurred vision.   ED course: Initial vital signs were temperature 98.7 F, pulse 108, respirations 16, BP 157/84 mmHg O2 sat 100% on room air.  Lab work: Her CBC is her white count 6.5, hemoglobin 11.9 g/dL platelets 264.  Troponin was normal.  EKG not significantly changed and nonischemic.  CMP showed normal sodium, chloride and calcium.  Potassium was 3.3 and CO2 20 mmol/L.  Glucose 126, BUN 44 creatinine 1.72.  Her creatinine was 1.78 few days ago.  In March her creatinine was 1.06 mg/dL.  Imaging: Two-view chest radiograph shows normal heart size and mediastinal contours.  Clear lungs  bilaterally.  There is no active cardiopulmonary disease.  CT head without contrast does not show any acute intracranial abnormality, but there is increased atrophy and areas of white matter disease most likely related to chronic microvascular ischemic changes.  There is a chronic left sphenoid sinusitis.   Review of Systems: As mentioned in the history of present illness. All other systems reviewed and are negative. Past Medical History:  Diagnosis Date   Diabetes ([place])    High cholesterol    Hypertension    Polymyalgia rheumatica ([place])    Renal disorder    Past Surgical History:  Procedure Laterality Date   ECTOPIC PREGNANCY SURGERY     HERNIA REPAIR     Social History:  reports that she has never smoked. She has never used smokeless tobacco. She reports that she does not drink alcohol and does not use drugs.  Allergies  Allergen Reactions   Morphine And Related Rash and Other (See Comments)    Syncope, also    Family History  Problem Relation Age of Onset   Healthy Mother    Heart attack Father     Prior to Admission medications   Medication Sig Start Date End Date Taking? Authorizing Provider  ACCU-CHEK FASTCLIX LANCETS MISC by Does not apply route.    [provider]  acetaminophen (TYLENOL) 500 MG tablet Take 1,000 mg by mouth every 6 (six) hours as needed for moderate pain.    [provider]  aspirin EC 81 MG tablet Take 81 mg by mouth daily.    [provider]  Blood Glucose Monitoring Suppl (ACCU-CHEK  NANO SMARTVIEW) w/Device KIT 1 strip by Does not apply route.    [provider]  Cholecalciferol (VITAMIN D) 2000 units CAPS Take 1 capsule by mouth daily.     [provider]  Fish Oil OIL Take 1 capsule by mouth daily. 109m    [provider]  fluticasone (FLONASE) 50 MCG/ACT nasal spray Place 1 spray into both nostrils daily as needed for allergies or rhinitis.    [provider]  hydrochlorothiazide  (HYDRODIURIL) 25 MG tablet Take 1 tablet (25 mg total) by mouth daily. 05/21/21   MMinette Brine FNP  metoprolol tartrate (LOPRESSOR) 50 MG tablet Take 1 tablet (50 mg total) by mouth 2 (two) times daily. 05/21/21   MMinette Brine FNP  olmesartan (BENICAR) 40 MG tablet Take 1 tablet (40 mg total) by mouth daily. 05/21/21   MMinette Brine FNP  omeprazole (PRILOSEC OTC) 20 MG tablet Take 20 mg by mouth daily. Bedtime    [provider]  pravastatin (PRAVACHOL) 80 MG tablet One tab po M-F, skip weekends 05/21/21   MMinette Brine FNP    Physical Exam: Vitals:   11/21/21 1515 11/21/21 1545 11/21/21 1714 11/21/21 1720  BP: (!) 183/77 (!) 184/70  (!) 170/80  Pulse: (!) 110 93  94  Resp: 19 17  (!) 21  Temp:   (!) 97.4 F (36.3 C)   TempSrc:   Oral   SpO2: 100% 98%  98%  Weight:      Height:       Physical Exam Vitals and nursing note reviewed.  Constitutional:      General: She is awake.     Appearance: Normal appearance. She is not ill-appearing.  HENT:     Head: Normocephalic.     Mouth/Throat:     Mouth: Mucous membranes are moist.  Eyes:     General: No scleral icterus.    Pupils: Pupils are equal, round, and reactive to light.  Neck:     Vascular: No JVD.  Cardiovascular:     Rate and Rhythm: Normal rate and regular rhythm.     Heart sounds: S1 normal and S2 normal.  Pulmonary:     Effort: Pulmonary effort is normal.     Breath sounds: No wheezing, rhonchi or rales.  Abdominal:     General: Bowel sounds are normal. There is no distension.     Palpations: Abdomen is soft.     Tenderness: There is no abdominal tenderness. There is no right CVA tenderness, left CVA tenderness or guarding.  Musculoskeletal:     Cervical back: Neck supple.     Right lower leg: No edema.     Left lower leg: No edema.  Skin:    General: Skin is warm and dry.  Neurological:     General: No focal deficit present.     Mental Status: She is alert and oriented to person, place, and time.   Psychiatric:        Mood and Affect: Mood normal.        Behavior: Behavior normal. Behavior is cooperative.   Data Reviewed:  There are no new results to review at this time.  Vent. rate 102 BPM PR interval 180 ms QRS duration 87 ms QT/QTcB 336/438 ms P-R-T axes 64 19 -42 Sinus tachycardia Low voltage, precordial leads Abnormal R-wave progression, early transition Borderline repolarization abnormality  Assessment and Plan: Principal Problem:   AKI ([place] kidney injury) [place] Superimposed on:   Chronic kidney disease, stage 3a (  [place]) Associated with   Increased anion gap metabolic acidosis Observation/telemetry. Continue IV fluids. Hold ARB/ACE. Hold diuretic. Avoid hypotension. Avoid nephrotoxins. Monitor intake and output. Monitor renal function electrolytes.  Active Problems:   HTN (hypertension) Holding ARB and HCTZ. Hydralazine 10 mg IVP x1 now. Begin amlodipine 2.5 mg p.o. daily.    Hypokalemia Replacement given earlier. Follow potassium level.    Type 2 diabetes mellitus ([facility]) Most recent hemoglobin A1c of 6.5%. Carbohydrate modified diet. CBG monitoring before meals and bedtime.    Hyperlipidemia Continue pravastatin 80 mg p.o. daily Monday through Friday.    Normocytic anemia Monitor hematocrit and hemoglobin.    Hyperbilirubinemia Recheck LFTs in AM.    Hyperproteinemia Volume depletion? Recheck LFTs in AM.     Advance Care Planning:   Code Status: Full Code   Consults:   Family Communication: Her niece was at bedside and provided some information.  Severity of Illness: The appropriate patient status for this patient is OBSERVATION. Observation status is judged to be reasonable and necessary in order to provide the required intensity of service to ensure the patient's safety. The patient's presenting symptoms, physical exam findings, and initial radiographic and laboratory data in the context of their medical condition is felt to place  them at decreased risk for further clinical deterioration. Furthermore, it is anticipated that the patient will be medically stable for discharge from the hospital within 2 midnights of admission.   Author: Reubin Milan, MD 11/21/2021 5:22 PM  For on call review [email or web address].   This document was prepared using Dragon voice recognition software and may contain some unintended transcription errors.

## 2021-11-21 NOTE — ED Provider Triage Note (Signed)
Emergency Medicine Provider Triage Evaluation Note  Rebekah Avila , a 88 y.o. female  was evaluated in triage.  Pt complains of medication management. The patient reports that she has been having lightheadedness associated with her metformin use. She reports that someone has been sneaking in metformin into her medication regimen and that she doesn't take it. Record show that it was added on 08/30/21 for 500mg  bid. Then was shown it was decreased recently. She was seen by her PCP on 11/16/21 and was told to maintain on the medications. The patient reports that she hasn't been taking it, but took it today and then felt lightheaded afterwards and think her blood sugar was lowered. Spoke with daughter on the phone and she reports she has been more confused recently over the past few weeks and has been "fixated on somethings".   Review of Systems  Positive:  Negative:   Physical Exam  BP (!) 157/84 (BP Location: Right Arm)   Pulse (!) 108   Temp 98.7 F (37.1 C) (Oral)   Resp 16   Ht 5\' 1"  (1.549 m)   Wt 51.7 kg   SpO2 100%   BMI 21.54 kg/m  Gen:   Awake, no distress   Resp:  Normal effort  MSK:   Moves extremities without difficulty  Other:    Medical Decision Making  Medically screening exam initiated at 2:53 PM.  Appropriate orders placed.  11/18/21 was informed that the remainder of the evaluation will be completed by another provider, this initial triage assessment does not replace that evaluation, and the importance of remaining in the ED until their evaluation is complete.  Unsure what the true complaint is. Patient is a poor historian. Unsure if this is paranoia. Daughter would liked her to be evaluated. Given the lightheadedness, could be hypoglycemia, dementia, UTI. Notes show that the patient was lowered from BID to QD with metformin. The patient reports that she was never on this and that it was an "error".   , Yolonda Kida 11/21/21 1458

## 2021-11-22 DIAGNOSIS — N179 Acute kidney failure, unspecified: Secondary | ICD-10-CM | POA: Diagnosis not present

## 2021-11-22 LAB — URINALYSIS, ROUTINE W REFLEX MICROSCOPIC
Bacteria, UA: NONE SEEN
Bilirubin Urine: NEGATIVE
Glucose, UA: NEGATIVE mg/dL
Hgb urine dipstick: NEGATIVE
Ketones, ur: 20 mg/dL — AB
Nitrite: NEGATIVE
Protein, ur: NEGATIVE mg/dL
Specific Gravity, Urine: 1.013 (ref 1.005–1.030)
pH: 5 (ref 5.0–8.0)

## 2021-11-22 LAB — BASIC METABOLIC PANEL
Anion gap: 13 (ref 5–15)
BUN: 30 mg/dL — ABNORMAL HIGH (ref 8–23)
CO2: 19 mmol/L — ABNORMAL LOW (ref 22–32)
Calcium: 9.2 mg/dL (ref 8.9–10.3)
Chloride: 104 mmol/L (ref 98–111)
Creatinine, Ser: 1.21 mg/dL — ABNORMAL HIGH (ref 0.44–1.00)
GFR, Estimated: 43 mL/min — ABNORMAL LOW (ref >60)
Glucose, Bld: 100 mg/dL — ABNORMAL HIGH (ref 70–99)
Potassium: 3.6 mmol/L (ref 3.5–5.1)
Sodium: 136 mmol/L (ref 135–145)

## 2021-11-22 LAB — GLUCOSE, CAPILLARY
Glucose-Capillary: 116 mg/dL — ABNORMAL HIGH (ref 70–99)
Glucose-Capillary: 124 mg/dL — ABNORMAL HIGH (ref 70–99)

## 2021-11-22 LAB — LACTIC ACID, PLASMA: Lactic Acid, Venous: 1.3 mmol/L (ref 0.5–1.9)

## 2021-11-22 MED ORDER — AMLODIPINE BESYLATE 2.5 MG PO TABS: 5.0000 mg | ORAL_TABLET | Freq: Every day | ORAL | 0 refills | Status: DC

## 2021-11-22 NOTE — Discharge Summary (Signed)
Rebekah Avila:[id number] DOB: October 01, 1931 DOA: 11/21/2021  PCP: [name], FNP  Admit date: 11/21/2021  Discharge date: 11/22/2021  Admitted From: Home   disposition: Home   Recommendations for Outpatient Follow-up:   Follow up with PCP in 1-2 weeks for blood pressure and BMP check. PCP: Please note BP med changes made in hospital, please monitor patient's blood pressure and titrate meds as is necessary.   Home Health: N/A Equipment/Devices: N/A Consultations: None Discharge Condition: Improved CODE STATUS: Full Diet Recommendation: Heart Healthy   Diet Order             Diet heart healthy/carb modified Room service appropriate? Yes; Fluid consistency: Thin  Diet effective now                    Chief Complaint  Patient presents with   Dizziness     Brief history of present illness from the day of admission and additional interim summary     Rebekah Avila is a 88 y.o. female with medical history significant of diet-controlled type 2 diabetes, hyperlipidemia, hypertension, polymyalgia rheumatica, stage 3a CKD due to hypertensive kidney disease who presented to the emergency department complaints of dizziness since last week after she took newly prescribed metformin by her PCP.  She believes that she has been having hypoglycemia, but does not have any hypoglycemic numbers on her records.  She has been eating and has not been diaphoretic.  However, her renal function seems to be off from baseline and she has a mild decrease in CO2 with an anion gap of 18.  She was unable to specify whether her lightheadedness was postural and not.  She denied fever, chills, rhinorrhea, sore throat, wheezing or hemoptysis.  No chest pain, palpitations, diaphoresis, PND, orthopnea or pitting edema of the lower  extremities.  No abdominal pain, nausea, emesis, diarrhea, constipation, melena or hematochezia.  No flank pain, dysuria, frequency or hematuria.  No polyuria, polydipsia, polyphagia or blurred vision.                                                                  Hospital Course   Patient was treated with IV fluid resuscitation, holding of metformin, holding of all losartan and HCTZ.  Patient states she felt much improved the day after admission and notes she has not felt this well in several months.  Creatinine improved to baseline with IV fluid resuscitation and holding of diuretic and ARB.  Patient's blood pressure was managed with addition of amlodipine.  Acute kidney injury Fattening back to baseline/improved over baseline with holding of ARB and HCTZ and fluid resuscitation ACE and ARB were discontinued  Dizziness/malaise  Thought to be possibly secondary to anion gap metabolic acidosis AGMA entirely resolved with holding of metformin and IV  fluids Lactate was 1.2 Metformin was discontinued and patient was told not to take metformin in the future  HTN Metoprolol continued Amlodipine 5 mg daily added DC HCTZ and telmisartan Follow-up with PCP in 1 to 2 weeks for titration of meds.   Discharge diagnosis     Principal Problem:   AKI (acute kidney injury) ([place]) Active Problems:   HTN (hypertension)   Hypokalemia   Type 2 diabetes mellitus (HCC)   Hyperlipidemia   Chronic kidney disease, stage 3a (HCC)   Normocytic anemia   Hyperbilirubinemia   Hyperproteinemia   Increased anion gap metabolic acidosis    Discharge instructions    Discharge Instructions     Discharge instructions   Complete by: As directed    1.  Do not ever take Metformin ever again.  2.  We have made some changes to your blood pressure medicines in the hospital.  We have stopped hydrochlorothiazide and olmesartan (also called Benicar) because they can have an effect on your kidneys.  Your kidney  function is much improved after we gave you IV fluids. 3.  We have continued you on metoprolol which you have been taking. 4.  We have started you on a new medicine called amlodipine.  This is a good blood pressure medicine however it can sometimes cause people to have swelling in their feet.  If this happens to you, do not stop taking the medicine but you discuss it with your PCP.  The leg swelling, if it does happen, is not dangerous but it can be bothersome. 5.  Your blood pressure might still be elevated despite metoprolol and amlodipine, your PCP might need to add another medicine but that will be up to them as an outpatient.   Increase activity slowly   Complete by: As directed        Discharge Medications   Allergies as of 11/22/2021       Reactions   Morphine And Related Rash, Other (See Comments)   Syncope, also        Medication List     STOP taking these medications    hydrochlorothiazide 25 MG tablet Commonly known as: HYDRODIURIL   olmesartan 40 MG tablet Commonly known as: BENICAR       TAKE these medications    Accu-Chek FastClix Lancets Misc by Does not apply route.   Accu-Chek Nano SmartView w/Device Kit 1 strip by Does not apply route.   acetaminophen 500 MG tablet Commonly known as: TYLENOL Take 1,000 mg by mouth every 6 (six) hours as needed for moderate pain.   amLODipine 2.5 MG tablet Commonly known as: NORVASC Take 2 tablets (5 mg total) by mouth daily. Start taking on: November 23, 2021   aspirin EC 81 MG tablet Take 81 mg by mouth every evening.   Fish Oil 1000 MG Caps Take 1,000 mg by mouth daily with breakfast.   fluticasone 50 MCG/ACT nasal spray Commonly known as: FLONASE Place 1 spray into both nostrils daily as needed for allergies or rhinitis.   metoprolol tartrate 50 MG tablet Commonly known as: LOPRESSOR Take 1 tablet (50 mg total) by mouth 2 (two) times daily. What changed:  when to take this additional instructions    omeprazole 20 MG tablet Commonly known as: PRILOSEC OTC Take 20 mg by mouth at bedtime.   pravastatin 80 MG tablet Commonly known as: PRAVACHOL One tab po M-F, skip weekends What changed:  how much to take how to take this when to take  this additional instructions          Major procedures and Radiology Reports - PLEASE review detailed and final reports thoroughly  -       DG Chest 2 View  Result Date: 11/21/2021 CLINICAL DATA:  In 89-year-old female presents with dizziness since last week. EXAM: CHEST - 2 VIEW COMPARISON:  June 30, 2013. FINDINGS: The heart size and mediastinal contours are within normal limits. Both lungs are clear. The visualized skeletal structures are unremarkable. IMPRESSION: No active cardiopulmonary disease. Electronically Signed   By: Geoffrey  Wile M.D.   On: 11/21/2021 15:20   CT Head Wo Contrast  Result Date: 11/21/2021 CLINICAL DATA:  An 89-year-old female presents for evaluation of mental status changes and dizziness. EXAM: CT HEAD WITHOUT CONTRAST TECHNIQUE: Contiguous axial images were obtained from the base of the skull through the vertex without intravenous contrast. RADIATION DOSE REDUCTION: This exam was performed according to the departmental dose-optimization program which includes automated exposure control, adjustment of the mA and/or kV according to patient size and/or use of iterative reconstruction technique. COMPARISON:  June 30, 2013. FINDINGS: Brain: No evidence of acute infarction, hemorrhage, hydrocephalus, extra-axial collection or mass lesion/mass effect. Increased atrophy and areas of white matter disease. Vascular: No hyperdense vessel or unexpected calcification. Skull: Normal. Negative for fracture or focal lesion. Sinuses/Orbits: Visualized paranasal sinuses and orbits without acute process. Opacification of LEFT sphenoid sinus with chronic appearance compatible with chronic sinusitis. Other: None IMPRESSION: 1. No acute  intracranial abnormality. 2. Increased atrophy and areas of white matter disease, most commonly related to chronic microvascular ischemic changes. 3. Chronic LEFT sphenoid sinusitis. Electronically Signed   By: Geoffrey  Wile M.D.   On: 11/21/2021 15:19    Micro Results    No results found for this or any previous visit (from the past 240 hour(s)).  Today   Subjective    Markeya Coad feels much improved since admission.  Feels ready to go home.  Denies chest pain, shortness of breath or abdominal pain.  Feels they can take care of themselves with the resources they have at home.  Objective   Blood pressure (!) 140/103, pulse 72, temperature 97.6 F (36.4 C), resp. rate 18, height 5' 1" (1.549 m), weight 51.7 kg, SpO2 100 %.   Intake/Output Summary (Last 24 hours) at 11/22/2021 1818 Last data filed at 11/22/2021 1308 Gross per 24 hour  Intake 823.48 ml  Output 750 ml  Net 73.48 ml    Exam General: Patient appears well and in good spirits sitting up in bed in no acute distress.  Eyes: sclera anicteric, conjuctiva mild injection bilaterally CVS: S1-S2, regular  Respiratory:  decreased air entry bilaterally secondary to decreased inspiratory effort, rales at bases  GI: NABS, soft, NT  LE: No edema.  Neuro: A/O x 3, Moving all extremities equally with normal strength, CN 3-12 intact, grossly nonfocal.  Psych: patient is logical and coherent, judgement and insight appear normal, mood and affect appropriate to situation.    Data Review   CBC w Diff:  Lab Results  Component Value Date   WBC 6.5 11/21/2021   HGB 11.9 (L) 11/21/2021   HGB 11.0 (L) 11/16/2021   HCT 35.7 (L) 11/21/2021   HCT 32.4 (L) 11/16/2021   PLT 264 11/21/2021   PLT 228 11/16/2021   LYMPHOPCT 27 11/21/2021   MONOPCT 11 11/21/2021   EOSPCT 0 11/21/2021   BASOPCT 1 11/21/2021    CMP:  Lab Results  Component Value Date     NA 136 11/22/2021   NA 140 11/16/2021   K 3.6 11/22/2021   CL 104 11/22/2021    CO2 19 (L) 11/22/2021   BUN 30 (H) 11/22/2021   BUN 36 (H) 11/16/2021   CREATININE 1.21 (H) 11/22/2021   GLU 122 08/03/2017   PROT 9.4 (H) 11/21/2021   PROT 8.0 11/16/2021   ALBUMIN 4.8 11/21/2021   ALBUMIN 4.6 11/16/2021   BILITOT 1.3 (H) 11/21/2021   BILITOT 0.7 11/16/2021   ALKPHOS 37 (L) 11/21/2021   AST 34 11/21/2021   ALT 21 11/21/2021  .   Total Time in preparing paper work, data evaluation and todays exam - 35 minutes   [place]  M.D on 11/22/2021 at 6:18 PM  [facility]    

## 2021-11-22 NOTE — Progress Notes (Signed)
  Transition of Care [facility]) Screening Note   Patient Details  Name: Rebekah Avila Date of Birth: 1932-01-05   Transition of [facility]) CM/SW Contact:    Lanier Clam, RN Phone Number: 11/22/2021, 1:20 PM    Transition of Care Department [facility]) has reviewed patient and no TOC needs have been identified at this time. We will continue to monitor patient advancement through interdisciplinary progression rounds. If new patient transition needs arise, please place a TOC consult.

## 2021-11-22 NOTE — Plan of Care (Signed)

## 2021-11-23 ENCOUNTER — Other Ambulatory Visit: Payer: Medicare Other

## 2021-11-23 ENCOUNTER — Telehealth: Payer: Self-pay

## 2021-11-23 NOTE — Telephone Encounter (Signed)
Transition Care Management Unsuccessful Follow-up Telephone Call  Date of discharge and from where:  11/22/2021 [place]   Attempts:  1st Attempt  Reason for unsuccessful TCM follow-up call:  Left voice message

## 2021-12-20 ENCOUNTER — Telehealth: Payer: Self-pay

## 2021-12-20 NOTE — Chronic Care Management (AMB) (Signed)
    Chronic Care Management Pharmacy Assistant   Name: Rebekah Avila  MRN: [id number] DOB: April 16, 1932  Reason for Encounter: Medication Review/ Medication Coordination  Recent office visits:  11-16-2021 Rebekah Avila, [place]. RBC= 3.68, Hemo= 11.0, Hema= 32.4. Glucose= 113, BUN= 36, Creatinine= 1.71, eGFR= 29, Calcium= 10.6, Alkaline Phosphatase= 39. A1C= 6.5. Ferritin= 215. HDL= 36. B12= >2000. CHANGE metformin 500 mg twice daily TO 500 mg daily.  Recent consult visits:  None  Hospital visits:  Medication Reconciliation was completed by comparing discharge summary, patient's EMR and Pharmacy list, and upon discussion with patient.  Admitted to the hospital on 11-21-2021 due to AKI. Discharge date was 11-22-2021 Discharged from [place]?Medications Started at [facility] Discharge:?? Amlodipine 2.5 mg take 2 tablets daily  Medication Changes at Hospital Discharge: None  Medications Discontinued at Hospital Discharge: HCTZ Olmesartan  Medications that remain the same after Hospital Discharge:??  -All other medications will remain the same.    Medications: Outpatient Encounter Medications as of 12/20/2021  Medication Sig   ACCU-CHEK FASTCLIX LANCETS MISC by Does not apply route.   acetaminophen (TYLENOL) 500 MG tablet Take 1,000 mg by mouth every 6 (six) hours as needed for moderate pain.   amLODipine (NORVASC) 2.5 MG tablet Take 2 tablets (5 mg total) by mouth daily.   aspirin EC 81 MG tablet Take 81 mg by mouth every evening.   Blood Glucose Monitoring Suppl (ACCU-CHEK NANO SMARTVIEW) w/Device KIT 1 strip by Does not apply route.   fluticasone (FLONASE) 50 MCG/ACT nasal spray Place 1 spray into both nostrils daily as needed for allergies or rhinitis.   metoprolol tartrate (LOPRESSOR) 50 MG tablet Take 1 tablet (50 mg total) by mouth 2 (two) times daily. (Patient taking differently: Take 50 mg by mouth See admin instructions. Take 50 mg by mouth with breakfast and  supper)   Omega-3 Fatty Acids (FISH OIL) 1000 MG CAPS Take 1,000 mg by mouth daily with breakfast.   omeprazole (PRILOSEC OTC) 20 MG tablet Take 20 mg by mouth at bedtime.   pravastatin (PRAVACHOL) 80 MG tablet One tab po M-F, skip weekends (Patient taking differently: Take 80 mg by mouth See admin instructions. Take 80 mg by mouth on Mon/Tues/Wed/Thurs/Fri only with supper)   No facility-administered encounter medications on file as of 12/20/2021.  Reviewed chart for medication changes ahead of medication coordination call.  No since last care coordination call/Pharmacist visit.   BP Readings from Last 3 Encounters:  11/22/21 (!) 140/103  11/16/21 110/80  07/22/21 134/70    Lab Results  Component Value Date   HGBA1C 6.5 (H) 11/16/2021    Contacted patient's daughter for delivery and patient is no longer at [facility]. Patient's new PCP is Rebekah Avila, Rebekah American, NP at [facility]. First appointment is 12-24-2021. Updated upstream and CCM team.  [place] Clinical Pharmacist Assistant [phone]

## 2021-12-24 ENCOUNTER — Ambulatory Visit (INDEPENDENT_AMBULATORY_CARE_PROVIDER_SITE_OTHER): Payer: Medicare Other | Admitting: Nurse Practitioner

## 2021-12-24 ENCOUNTER — Encounter: Payer: Self-pay | Admitting: Nurse Practitioner

## 2021-12-24 VITALS — BP 142/78 | HR 93 | Temp 97.8°F | Ht 60.05 in | Wt 117.0 lb

## 2021-12-24 DIAGNOSIS — J302 Other seasonal allergic rhinitis: Secondary | ICD-10-CM

## 2021-12-24 DIAGNOSIS — N183 Chronic kidney disease, stage 3 unspecified: Secondary | ICD-10-CM

## 2021-12-24 DIAGNOSIS — D631 Anemia in chronic kidney disease: Secondary | ICD-10-CM

## 2021-12-24 DIAGNOSIS — K219 Gastro-esophageal reflux disease without esophagitis: Secondary | ICD-10-CM

## 2021-12-24 DIAGNOSIS — E1122 Type 2 diabetes mellitus with diabetic chronic kidney disease: Secondary | ICD-10-CM | POA: Diagnosis not present

## 2021-12-24 DIAGNOSIS — E7800 Pure hypercholesterolemia, unspecified: Secondary | ICD-10-CM | POA: Diagnosis not present

## 2021-12-24 DIAGNOSIS — I10 Essential (primary) hypertension: Secondary | ICD-10-CM | POA: Diagnosis not present

## 2021-12-24 DIAGNOSIS — M19012 Primary osteoarthritis, left shoulder: Secondary | ICD-10-CM

## 2021-12-24 DIAGNOSIS — N1832 Chronic kidney disease, stage 3b: Secondary | ICD-10-CM

## 2021-12-24 DIAGNOSIS — M19011 Primary osteoarthritis, right shoulder: Secondary | ICD-10-CM

## 2021-12-24 MED ORDER — METOPROLOL TARTRATE 50 MG PO TABS: 50.0000 mg | ORAL_TABLET | Freq: Two times a day (BID) | ORAL | 2 refills | Status: AC

## 2021-12-24 MED ORDER — PRAVASTATIN SODIUM 80 MG PO TABS: ORAL_TABLET | ORAL | 2 refills | Status: AC

## 2021-12-24 MED ORDER — AMLODIPINE BESYLATE 5 MG PO TABS: 5.0000 mg | ORAL_TABLET | Freq: Every day | ORAL | 1 refills | Status: DC

## 2021-12-24 NOTE — Progress Notes (Signed)
Careteam: Patient Care Team: Lauree Chandler, NP as PCP - General (Geriatric Medicine) Mayford Knife, [facility] (Pharmacist)  PLACE OF SERVICE:  [place] Directive information Does Patient Have a Medical Advance Directive?: No, Would patient like information on creating a medical advance directive?: Yes (MAU/Ambulatory/Procedural Areas - Information given)  Allergies  Allergen Reactions   Morphine And Related Rash and Other (See Comments)    Syncope, also    Chief Complaint  Patient presents with   Establish Care    New patient to establish care. Pill bottles present/not present at initial appointment. NCIR verified.      HPI: Patient is a 88 y.o. female to establish care.   Htn- blood pressure slightly elevated at home but generally controlled. She is taking norvasc 5 mg daily, metoprolol tartrate 50 mg daily also on ASA 81 mg daily  Using ginkgo biloba for memory. Taking co q-10, fish oil, and mag but not sure why.   Allergies- using flonase PRN.   Has arthritis in shoulders- occasionally will use tylenol PRN  GERD- using omeprazole rarely   Hyperlipidemia- on pravastatin  DM-. Recently was started on metformin and did not have a good reaction to it. Felt like she was having hypoglycemic episodes based on hospital noted.   Daughter lives in [place] here today during appt.  She was driving up until recent hospitalization.     Review of Systems:  Review of Systems  Constitutional:  Negative for chills, fever and weight loss.  HENT:  Negative for tinnitus.   Respiratory:  Negative for cough, sputum production and shortness of breath.   Cardiovascular:  Negative for chest pain, palpitations and leg swelling.  Gastrointestinal:  Negative for abdominal pain, constipation, diarrhea and heartburn.  Genitourinary:  Negative for dysuria, frequency and urgency.  Musculoskeletal:  Negative for back pain, falls, joint pain and myalgias.  Skin: Negative.    Neurological:  Negative for dizziness and headaches.  Psychiatric/Behavioral:  Negative for depression and memory loss. The patient does not have insomnia.    Past Medical History:  Diagnosis Date   Diabetes ([place])    High cholesterol    Hypertension    Polymyalgia rheumatica ([place])    Renal disorder    Past Surgical History:  Procedure Laterality Date   ECTOPIC PREGNANCY SURGERY     HERNIA REPAIR     Social History:   reports that she has never smoked. She has never used smokeless tobacco. She reports that she does not drink alcohol and does not use drugs.  Family History  Problem Relation Age of Onset   Healthy Mother    Heart attack Father     Medications: Patient's Medications  New Prescriptions   No medications on file  Previous Medications   ACCU-CHEK FASTCLIX LANCETS MISC    by Does not apply route.   ACETAMINOPHEN (TYLENOL) 500 MG TABLET    Take 1,000 mg by mouth every 6 (six) hours as needed for moderate pain.   AMLODIPINE (NORVASC) 2.5 MG TABLET    Take 2 tablets (5 mg total) by mouth daily.   ASPIRIN EC 81 MG TABLET    Take 81 mg by mouth every evening.   BLOOD GLUCOSE MONITORING SUPPL (ACCU-CHEK NANO SMARTVIEW) W/DEVICE KIT    1 strip by Does not apply route.   FLUTICASONE (FLONASE) 50 MCG/ACT NASAL SPRAY    Place 1 spray into both nostrils daily as needed for allergies or rhinitis.   METOPROLOL TARTRATE (  LOPRESSOR) 50 MG TABLET    Take 1 tablet (50 mg total) by mouth 2 (two) times daily.   OMEGA-3 FATTY ACIDS (FISH OIL) 1000 MG CAPS    Take 1,000 mg by mouth daily with breakfast.   OMEPRAZOLE (PRILOSEC OTC) 20 MG TABLET    Take 20 mg by mouth at bedtime.   PRAVASTATIN (PRAVACHOL) 80 MG TABLET    One tab po M-F, skip weekends  Modified Medications   No medications on file  Discontinued Medications   No medications on file    Physical Exam:  Vitals:   12/24/21 1016  BP: (!) 142/78  Pulse: 93  Temp: 97.8 F (36.6 C)  TempSrc: Temporal  SpO2: 99%   Weight: 117 lb (53.1 kg)  Height: 5' 0.05" (1.525 m)   Body mass index is 22.81 kg/m. Wt Readings from Last 3 Encounters:  12/24/21 117 lb (53.1 kg)  11/21/21 114 lb (51.7 kg)  11/16/21 114 lb (51.7 kg)    Physical Exam Constitutional:      General: She is not in acute distress.    Appearance: She is well-developed. She is not diaphoretic.  HENT:     Head: Normocephalic and atraumatic.     Right Ear: External ear normal.     Left Ear: External ear normal.     Nose: Nose normal.     Mouth/Throat:     Pharynx: No oropharyngeal exudate.  Eyes:     Conjunctiva/sclera: Conjunctivae normal.     Pupils: Pupils are equal, round, and reactive to light.  Cardiovascular:     Rate and Rhythm: Normal rate and regular rhythm.     Heart sounds: Normal heart sounds.  Pulmonary:     Effort: Pulmonary effort is normal.     Breath sounds: Normal breath sounds.  Abdominal:     General: Bowel sounds are normal.     Palpations: Abdomen is soft.  Musculoskeletal:     Cervical back: Normal range of motion and neck supple.     Right lower leg: No edema.     Left lower leg: No edema.  Skin:    General: Skin is warm and dry.  Neurological:     Mental Status: She is alert. Mental status is at baseline.  Psychiatric:        Mood and Affect: Mood normal.     Labs reviewed: Basic Metabolic Panel: Recent Labs    11/16/21 1214 11/21/21 1453 11/22/21 0431  NA 140 141 136  K 3.7 3.3* 3.6  CL 101 103 104  CO2 20 20* 19*  GLUCOSE 113* 126* 100*  BUN 36* 44* 30*  CREATININE 1.71* 1.72* 1.21*  CALCIUM 10.6* 10.3 9.2   Liver Function Tests: Recent Labs    07/22/21 1121 11/16/21 1214 11/21/21 1453  AST 28 29 34  ALT _0 ALKPHOS 57 39* 37*  BILITOT 0.7 0.7 1.3*  PROT 8.1 8.0 9.4*  ALBUMIN 4.7* 4.6 4.8   No results for input(s): "LIPASE", "AMYLASE" in the last 8760 hours. No results for input(s): "AMMONIA" in the last 8760 hours. CBC: Recent Labs    07/22/21 1121  11/16/21 1214 11/21/21 1453  WBC 5.0 4.3 6.5  NEUTROABS  --   --  4.0  HGB 11.5 11.0* 11.9*  HCT 33.7* 32.4* 35.7*  MCV 88 88 90.4  PLT 270 228 264   Lipid Panel: Recent Labs    07/22/21 1121 11/16/21 1214  CHOL 164 149  HDL 45 36*  LDLCALC 100* 96  TRIG 102 89  CHOLHDL 3.6 4.1   TSH: No results for input(s): "TSH" in the last 8760 hours. A1C: Lab Results  Component Value Date   HGBA1C 6.5 (H) 11/16/2021     Assessment/Plan 1. Type 2 diabetes mellitus with stage 3 chronic kidney disease, without long-term current use of insulin, unspecified whether stage 3a or 3b CKD ([place]) -diet controlled, became hypoglycemic on metformin which was stopped.  Encouraged dietary compliance, routine foot care/monitoring and to keep up with diabetic eye exams through ophthalmology  - Hemoglobin A1c; Future  2. Pure hypercholesterolemia Continues on pravastatin.  - pravastatin (PRAVACHOL) 80 MG tablet; One tab po M-F, skip weekends  Dispense: 75 tablet; Refill: 2  3. Primary hypertension -Blood pressure controlled, goal bp <140/90 Continue current medications and dietary modifications follow metabolic panel - CBC with Differential/Platelet - CMP with eGFR([facility]) - amLODipine (NORVASC) 5 MG tablet; Take 1 tablet (5 mg total) by mouth daily.  Dispense: 90 tablet; Refill: 1 - metoprolol tartrate (LOPRESSOR) 50 MG tablet; Take 1 tablet (50 mg total) by mouth 2 (two) times daily.  Dispense: 180 tablet; Refill: 2 - CBC with Differential/Platelet; Future - CMP with eGFR([facility]); Future  4. Anemia due to stage 3b chronic kidney disease ([place]) Will follow up CBC today  5. Gastroesophageal reflux disease without esophagitis Stable, not requiring any routine medications.   6. Seasonal allergies Controlled using flonase PRN.  7. Primary osteoarthritis of both shoulders Stable, uses tylenol if needed.    Return in about 4 months (around 04/25/2022). Labs prior AWV in [place] with labs  prior to appt.  [name]. [place], [facility] [phone]

## 2021-12-24 NOTE — Patient Instructions (Addendum)
Schedule awv for after January 8th 2024-- schedule labs with AWV  Schedule 4 month follow up after AWV

## 2021-12-25 LAB — CBC WITH DIFFERENTIAL/PLATELET
Absolute Monocytes: 520 cells/uL (ref 200–950)
Basophils Absolute: 31 cells/uL (ref 0–200)
Basophils Relative: 0.6 %
Eosinophils Absolute: 20 cells/uL (ref 15–500)
Eosinophils Relative: 0.4 %
HCT: 32 % — ABNORMAL LOW (ref 35.0–45.0)
Hemoglobin: 10.7 g/dL — ABNORMAL LOW (ref 11.7–15.5)
Lymphs Abs: 1056 cells/uL (ref 850–3900)
MCH: 30.4 pg (ref 27.0–33.0)
MCHC: 33.4 g/dL (ref 32.0–36.0)
MCV: 90.9 fL (ref 80.0–100.0)
MPV: 9 fL (ref 7.5–12.5)
Monocytes Relative: 10.2 %
Neutro Abs: 3473 cells/uL (ref 1500–7800)
Neutrophils Relative %: 68.1 %
Platelets: 256 10*3/uL (ref 140–400)
RBC: 3.52 10*6/uL — ABNORMAL LOW (ref 3.80–5.10)
RDW: 13.5 % (ref 11.0–15.0)
Total Lymphocyte: 20.7 %
WBC: 5.1 10*3/uL (ref 3.8–10.8)

## 2021-12-25 LAB — COMPLETE METABOLIC PANEL WITH GFR
AG Ratio: 1.3 (calc) (ref 1.0–2.5)
ALT: 14 U/L (ref 6–29)
AST: 22 U/L (ref 10–35)
Albumin: 3.9 g/dL (ref 3.6–5.1)
Alkaline phosphatase (APISO): 38 U/L (ref 37–153)
BUN/Creatinine Ratio: 9 (calc) (ref 6–22)
BUN: 9 mg/dL (ref 7–25)
CO2: 24 mmol/L (ref 20–32)
Calcium: 9.3 mg/dL (ref 8.6–10.4)
Chloride: 107 mmol/L (ref 98–110)
Creat: 1 mg/dL — ABNORMAL HIGH (ref 0.60–0.95)
Globulin: 2.9 g/dL (calc) (ref 1.9–3.7)
Glucose, Bld: 115 mg/dL — ABNORMAL HIGH (ref 65–99)
Potassium: 3.5 mmol/L (ref 3.5–5.3)
Sodium: 144 mmol/L (ref 135–146)
Total Bilirubin: 1.2 mg/dL (ref 0.2–1.2)
Total Protein: 6.8 g/dL (ref 6.1–8.1)
eGFR: 55 mL/min/{1.73_m2} — ABNORMAL LOW (ref >=60)

## 2022-02-10 DIAGNOSIS — H524 Presbyopia: Secondary | ICD-10-CM | POA: Diagnosis not present

## 2022-02-15 ENCOUNTER — Ambulatory Visit (INDEPENDENT_AMBULATORY_CARE_PROVIDER_SITE_OTHER): Payer: Medicare Other | Admitting: Family

## 2022-02-15 ENCOUNTER — Encounter: Payer: Medicare Other | Admitting: Family

## 2022-02-15 ENCOUNTER — Encounter: Payer: Self-pay | Admitting: Family

## 2022-02-15 VITALS — BP 128/68 | HR 74 | Temp 97.8°F | Resp 16 | Ht 60.05 in | Wt 118.8 lb

## 2022-02-15 DIAGNOSIS — R600 Localized edema: Secondary | ICD-10-CM | POA: Diagnosis not present

## 2022-02-15 DIAGNOSIS — I10 Essential (primary) hypertension: Secondary | ICD-10-CM | POA: Diagnosis not present

## 2022-02-15 DIAGNOSIS — Z23 Encounter for immunization: Secondary | ICD-10-CM

## 2022-02-15 MED ORDER — AMLODIPINE BESYLATE 2.5 MG PO TABS: 2.5000 mg | ORAL_TABLET | Freq: Every day | ORAL | 0 refills | Status: DC

## 2022-02-15 NOTE — Patient Instructions (Signed)
-   check Blood pressure at home and record on log provided and notify provider if B/p > 140/90  ? ?

## 2022-02-15 NOTE — Progress Notes (Signed)
Provider: Lamari Beckles FNP-C  Lauree Chandler, NP  Patient Care Team: Lauree Chandler, NP as PCP - General (Geriatric Medicine) Mayford Knife, [facility] (Pharmacist) Warden Fillers, MD as Consulting Physician (Ophthalmology)  Extended Emergency Contact Information Primary Emergency Contact: Waldroup,Gail Mobile Phone: [phone] Relation: Daughter Secondary Emergency Contact: Pennix,Vanessa Mobile Phone: [phone] Relation: Niece  Code Status: Full Code  Goals of care: Advanced Directive information    02/15/2022    2:52 PM  Advanced Directives  Does Patient Have a Medical Advance Directive? No  Would patient like information on creating a medical advance directive? No - Patient declined     Chief Complaint  Patient presents with   Acute Visit    Patient complains of blood pressure, and ankle swelling from medication she was given.    HPI:  Pt is a 88 y.o. female seen today for an acute visit for evaluation of blood pressure and ankle swelling. She denies any headache,dizziness,vision changes,fatigue,chest tightness,palpitation,chest pain or shortness of breath.    No abrupt weight gain noted.  Past Medical History:  Diagnosis Date   Diabetes ([place])    High cholesterol    Hypertension    Polymyalgia rheumatica (HCC)    Renal disorder    Past Surgical History:  Procedure Laterality Date   ECTOPIC PREGNANCY SURGERY     HERNIA REPAIR      Allergies  Allergen Reactions   Morphine And Related Rash and Other (See Comments)    Syncope, also    Outpatient Encounter Medications as of 02/15/2022  Medication Sig   ACCU-CHEK FASTCLIX LANCETS MISC 1 Device by Does not apply route daily at 12 noon.   acetaminophen (TYLENOL) 500 MG tablet Take 1,000 mg by mouth as needed.   amLODipine (NORVASC) 5 MG tablet Take 1 tablet (5 mg total) by mouth daily.   aspirin EC 81 MG tablet Take 81 mg by mouth every evening.   Blood Glucose Monitoring Suppl (ACCU-CHEK  NANO SMARTVIEW) w/Device KIT 1 strip by Does not apply route.   Coenzyme Q10 (COQ10) 100 MG CAPS Take 1 capsule by mouth daily at 12 noon.   fluticasone (FLONASE) 50 MCG/ACT nasal spray Place 1 spray into both nostrils daily as needed for allergies or rhinitis.   Ginkgo Biloba (GINKOBA PO) Take 120 mg by mouth daily at 12 noon.   metoprolol tartrate (LOPRESSOR) 50 MG tablet Take 1 tablet (50 mg total) by mouth 2 (two) times daily.   Omega-3 Fatty Acids (FISH OIL) 1000 MG CAPS Take 1,000 mg by mouth daily with breakfast.   pravastatin (PRAVACHOL) 80 MG tablet One tab po M-F, skip weekends   No facility-administered encounter medications on file as of 02/15/2022.    Review of Systems  Constitutional:  Negative for appetite change, chills, fatigue, fever and unexpected weight change.  HENT:  Negative for congestion, dental problem, ear discharge, ear pain, facial swelling, hearing loss, nosebleeds, postnasal drip, rhinorrhea, sinus pressure, sinus pain, sneezing, sore throat, tinnitus and trouble swallowing.   Eyes:  Negative for pain, discharge, redness, itching and visual disturbance.  Respiratory:  Negative for cough, chest tightness, shortness of breath and wheezing.   Cardiovascular:  Positive for leg swelling. Negative for chest pain and palpitations.  Gastrointestinal:  Negative for abdominal distention, abdominal pain, blood in stool, constipation, diarrhea, nausea and vomiting.  Genitourinary:  Negative for difficulty urinating, dysuria, flank pain, frequency and urgency.  Musculoskeletal:  Negative for arthralgias, back pain, gait problem, joint swelling, myalgias, neck pain  and neck stiffness.  Skin:  Negative for color change, pallor, rash and wound.  Neurological:  Negative for dizziness, syncope, speech difficulty, weakness, light-headedness and headaches.    Immunization History  Administered Date(s) Administered   Fluad Quad(high Dose 65+) 02/11/2020, 05/12/2021, 02/15/2022    Influenza, High Dose Seasonal PF 02/05/2018, 12/17/2018   PFIZER(Purple Top)SARS-COV-2 Vaccination 05/18/2019, 06/21/2019, 02/08/2020, 09/08/2020   PNEUMOCOCCAL CONJUGATE-20 07/22/2021   Pfizer Covid-19 Vaccine Bivalent Booster 12yrs & up 02/05/2021   Pneumococcal Polysaccharide-23 08/19/2019   Pertinent  Health Maintenance Due  Topic Date Due   OPHTHALMOLOGY EXAM  05/18/2022   HEMOGLOBIN A1C  05/19/2022   FOOT EXAM  11/17/2022   INFLUENZA VACCINE  Completed   DEXA SCAN  Completed      11/21/2021    6:15 PM 11/21/2021    9:26 PM 11/22/2021    7:47 AM 12/24/2021   10:10 AM 02/15/2022    2:52 PM  Fall Risk  Falls in the past year?    0 0  Was there an injury with Fall?    0 0  Fall Risk Category Calculator    0 0  Fall Risk Category    Low Low  Patient Fall Risk Level Moderate fall risk Moderate fall risk Moderate fall risk Low fall risk Low fall risk  Patient at Risk for Falls Due to    No Fall Risks No Fall Risks  Fall risk Follow up    Falls evaluation completed Falls evaluation completed   Functional Status Survey:    Vitals:   02/15/22 1448  BP: 128/68  Pulse: 74  Resp: 16  Temp: 97.8 F (36.6 C)  SpO2: 97%  Weight: 118 lb 12.8 oz (53.9 kg)  Height: 5' 0.05" (1.525 m)   Body mass index is 23.16 kg/m. Physical Exam Vitals reviewed.  Constitutional:      General: She is not in acute distress.    Appearance: Normal appearance. She is normal weight. She is not ill-appearing or diaphoretic.  HENT:     Head: Normocephalic.     Right Ear: Tympanic membrane, ear canal and external ear normal. There is no impacted cerumen.     Left Ear: Tympanic membrane, ear canal and external ear normal. There is no impacted cerumen.     Nose: Nose normal. No congestion or rhinorrhea.     Mouth/Throat:     Mouth: Mucous membranes are moist.     Pharynx: Oropharynx is clear. No oropharyngeal exudate or posterior oropharyngeal erythema.  Eyes:     General: No scleral icterus.        Right eye: No discharge.        Left eye: No discharge.     Extraocular Movements: Extraocular movements intact.     Conjunctiva/sclera: Conjunctivae normal.     Pupils: Pupils are equal, round, and reactive to light.  Neck:     Vascular: No carotid bruit.  Cardiovascular:     Rate and Rhythm: Normal rate and regular rhythm.     Pulses: Normal pulses.     Heart sounds: Normal heart sounds. No murmur heard.    No friction rub. No gallop.  Pulmonary:     Effort: Pulmonary effort is normal. No respiratory distress.     Breath sounds: Normal breath sounds. No wheezing, rhonchi or rales.  Chest:     Chest wall: No tenderness.  Abdominal:     General: Bowel sounds are normal. There is no distension.     Palpations: Abdomen   is soft. There is no mass.     Tenderness: There is no abdominal tenderness. There is no right CVA tenderness, left CVA tenderness, guarding or rebound.  Musculoskeletal:        General: No swelling or tenderness. Normal range of motion.     Cervical back: Normal range of motion. No rigidity or tenderness.     Right lower leg: Edema present.     Left lower leg: Edema present.  Lymphadenopathy:     Cervical: No cervical adenopathy.  Skin:    General: Skin is warm and dry.     Coloration: Skin is not pale.     Findings: No bruising, erythema, lesion or rash.  Neurological:     Mental Status: She is alert and oriented to person, place, and time.     Cranial Nerves: No cranial nerve deficit.     Sensory: No sensory deficit.     Motor: No weakness.     Coordination: Coordination normal.     Gait: Gait abnormal.  Psychiatric:        Mood and Affect: Mood normal.        Speech: Speech normal.        Behavior: Behavior normal.        Thought Content: Thought content normal.        Judgment: Judgment normal.     Labs reviewed: Recent Labs    11/21/21 1453 11/22/21 0431 12/24/21 1112  NA 141 136 144  K 3.3* 3.6 3.5  CL 103 104 107  CO2 20* 19* 24  GLUCOSE  126* 100* 115*  BUN 44* 30* 9  CREATININE 1.72* 1.21* 1.00*  CALCIUM 10.3 9.2 9.3   Recent Labs    07/22/21 1121 11/16/21 1214 11/21/21 1453 12/24/21 1112  AST 28 29 34 22  ALT 13 14 21 14  ALKPHOS 57 39* 37*  --   BILITOT 0.7 0.7 1.3* 1.2  PROT 8.1 8.0 9.4* 6.8  ALBUMIN 4.7* 4.6 4.8  --    Recent Labs    11/16/21 1214 11/21/21 1453 12/24/21 1112  WBC 4.3 6.5 5.1  NEUTROABS  --  4.0 3,473  HGB 11.0* 11.9* 10.7*  HCT 32.4* 35.7* 32.0*  MCV 88 90.4 90.9  PLT 228 264 256   Lab Results  Component Value Date   TSH 2.510 07/20/2020   Lab Results  Component Value Date   HGBA1C 6.5 (H) 11/16/2021   Lab Results  Component Value Date   CHOL 149 11/16/2021   HDL 36 (L) 11/16/2021   LDLCALC 96 11/16/2021   TRIG 89 11/16/2021   CHOLHDL 4.1 11/16/2021    Significant Diagnostic Results in last 30 days:  No results found.  Assessment/Plan 1. Need for influenza vaccination Afebrile Flut shot administered by CMA no acute reaction reported.  Flu Vaccine QUAD High Dose(Fluad)  2. Primary hypertension Blood pressure well controlled Recommend reducing amlodipine as below due to leg swelling. Continue to monitor blood pressure notify provider if trending up - amLODipine (NORVASC) 2.5 MG tablet; Take 1 tablet (tablet daily total) by mouth daily.  Dispense: 30 tablet; Refill: 0  3. Bilateral lower extremity edema No signs of fluid overload Recommend knee-high compression stockings on in the morning and off at bedtime to keep the swelling down. Check weight at home and notify provider for any abrupt weight gain greater than 3 pounds in 1 day - Compression stockings -prescription written given to patient to take to pharmacy or medical supply  Family/   staff Communication: Reviewed plan of care with patient verbalized understanding  Labs/tests ordered: None   Next Appointment: Return in about 2 weeks (around 03/01/2022) for blood pressure check .    C , NP 

## 2022-02-18 ENCOUNTER — Telehealth: Payer: Self-pay | Admitting: *Deleted

## 2022-02-18 NOTE — Telephone Encounter (Signed)
Patient was advised on recent visit to reduce Amlodipine from 5 mg to 2.5 mg tablet due to ankle swelling possible side effects from amlodipine.

## 2022-02-18 NOTE — Telephone Encounter (Signed)
[facility] notified, spoke with Caroline More.   Patient notified and understands the dosage change.

## 2022-02-18 NOTE — Telephone Encounter (Signed)
[facility] called and stated that they received a Rx for patient for Amlodipine 2.5mg .   Stated that patient has been taking Amlodipine 5mg  and they called and confirmed with patient and she told them that she is suppose to be taking 5mg  and did not know anything about a change to the 2.5mg .   Need Clarification. (Forwarded to [facility], was seen on 10/24)  Please Advise.

## 2022-02-21 ENCOUNTER — Emergency Department (HOSPITAL_COMMUNITY)
Admission: EM | Admit: 2022-02-21 | Discharge: 2022-02-21 | Disposition: A | Discharge status: Home / Self Care | Payer: Medicare Other | Attending: Emergency Medicine | Admitting: Emergency Medicine

## 2022-02-21 ENCOUNTER — Emergency Department (HOSPITAL_COMMUNITY): Payer: Medicare Other

## 2022-02-21 ENCOUNTER — Encounter (HOSPITAL_COMMUNITY): Payer: Self-pay

## 2022-02-21 ENCOUNTER — Other Ambulatory Visit: Payer: Self-pay

## 2022-02-21 DIAGNOSIS — R42 Dizziness and giddiness: Secondary | ICD-10-CM | POA: Diagnosis not present

## 2022-02-21 DIAGNOSIS — Z7982 Long term (current) use of aspirin: Secondary | ICD-10-CM | POA: Diagnosis not present

## 2022-02-21 DIAGNOSIS — R4182 Altered mental status, unspecified: Secondary | ICD-10-CM | POA: Insufficient documentation

## 2022-02-21 DIAGNOSIS — I159 Secondary hypertension, unspecified: Secondary | ICD-10-CM | POA: Diagnosis not present

## 2022-02-21 DIAGNOSIS — Z79899 Other long term (current) drug therapy: Secondary | ICD-10-CM | POA: Diagnosis not present

## 2022-02-21 DIAGNOSIS — E876 Hypokalemia: Secondary | ICD-10-CM | POA: Diagnosis not present

## 2022-02-21 DIAGNOSIS — I10 Essential (primary) hypertension: Secondary | ICD-10-CM | POA: Diagnosis not present

## 2022-02-21 LAB — BASIC METABOLIC PANEL
Anion gap: 10 (ref 5–15)
BUN: 8 mg/dL (ref 8–23)
CO2: 24 mmol/L (ref 22–32)
Calcium: 9 mg/dL (ref 8.9–10.3)
Chloride: 107 mmol/L (ref 98–111)
Creatinine, Ser: 0.69 mg/dL (ref 0.44–1.00)
GFR, Estimated: >60 mL/min (ref >60)
Glucose, Bld: 115 mg/dL — ABNORMAL HIGH (ref 70–99)
Potassium: 3.2 mmol/L — ABNORMAL LOW (ref 3.5–5.1)
Sodium: 141 mmol/L (ref 135–145)

## 2022-02-21 LAB — CBC
HCT: 33.6 % — ABNORMAL LOW (ref 36.0–46.0)
Hemoglobin: 11.2 g/dL — ABNORMAL LOW (ref 12.0–15.0)
MCH: 31.1 pg (ref 26.0–34.0)
MCHC: 33.3 g/dL (ref 30.0–36.0)
MCV: 93.3 fL (ref 80.0–100.0)
Platelets: 225 10*3/uL (ref 150–400)
RBC: 3.6 MIL/uL — ABNORMAL LOW (ref 3.87–5.11)
RDW: 13 % (ref 11.5–15.5)
WBC: 7.5 10*3/uL (ref 4.0–10.5)
nRBC: 0 % (ref 0.0–0.2)

## 2022-02-21 LAB — DIFFERENTIAL
Abs Immature Granulocytes: 0.02 10*3/uL (ref 0.00–0.07)
Basophils Absolute: 0 10*3/uL (ref 0.0–0.1)
Basophils Relative: 1 %
Eosinophils Absolute: 0 10*3/uL (ref 0.0–0.5)
Eosinophils Relative: 0 %
Immature Granulocytes: 0 %
Lymphocytes Relative: 14 %
Lymphs Abs: 1 10*3/uL (ref 0.7–4.0)
Monocytes Absolute: 0.7 10*3/uL (ref 0.1–1.0)
Monocytes Relative: 9 %
Neutro Abs: 5.7 10*3/uL (ref 1.7–7.7)
Neutrophils Relative %: 76 %

## 2022-02-21 LAB — PROTIME-INR
INR: 1.1 (ref 0.8–1.2)
Prothrombin Time: 13.6 seconds (ref 11.4–15.2)

## 2022-02-21 LAB — COMPREHENSIVE METABOLIC PANEL
ALT: 14 U/L (ref 0–44)
AST: 28 U/L (ref 15–41)
Albumin: 4.3 g/dL (ref 3.5–5.0)
Alkaline Phosphatase: 43 U/L (ref 38–126)
Anion gap: 13 (ref 5–15)
BUN: 10 mg/dL (ref 8–23)
CO2: 25 mmol/L (ref 22–32)
Calcium: 9.2 mg/dL (ref 8.9–10.3)
Chloride: 102 mmol/L (ref 98–111)
Creatinine, Ser: 0.88 mg/dL (ref 0.44–1.00)
GFR, Estimated: >60 mL/min (ref >60)
Glucose, Bld: 129 mg/dL — ABNORMAL HIGH (ref 70–99)
Potassium: 2.4 mmol/L — CL (ref 3.5–5.1)
Sodium: 140 mmol/L (ref 135–145)
Total Bilirubin: 1.3 mg/dL — ABNORMAL HIGH (ref 0.3–1.2)
Total Protein: 8.3 g/dL — ABNORMAL HIGH (ref 6.5–8.1)

## 2022-02-21 LAB — ETHANOL: Alcohol, Ethyl (B): <10 mg/dL (ref <10)

## 2022-02-21 LAB — MAGNESIUM: Magnesium: 1.5 mg/dL — ABNORMAL LOW (ref 1.7–2.4)

## 2022-02-21 LAB — APTT: aPTT: 30 seconds (ref 24–36)

## 2022-02-21 MED ORDER — POTASSIUM CHLORIDE 10 MEQ/100ML IV SOLN
10.0000 meq | INTRAVENOUS | Status: AC
  Administered 2022-02-21 (×2): 10 meq via INTRAVENOUS
  Filled 2022-02-21 (×2): qty 100

## 2022-02-21 MED ORDER — METOPROLOL TARTRATE 25 MG PO TABS
50.0000 mg | ORAL_TABLET | Freq: Once | ORAL | Status: AC
  Administered 2022-02-21: 50 mg via ORAL
  Filled 2022-02-21: qty 2

## 2022-02-21 MED ORDER — AMLODIPINE BESYLATE 5 MG PO TABS
2.5000 mg | ORAL_TABLET | Freq: Once | ORAL | Status: AC
  Administered 2022-02-21: 2.5 mg via ORAL
  Filled 2022-02-21: qty 1

## 2022-02-21 MED ORDER — MAGNESIUM SULFATE 2 GM/50ML IV SOLN
2.0000 g | Freq: Once | INTRAVENOUS | Status: AC
  Administered 2022-02-21: 2 g via INTRAVENOUS
  Filled 2022-02-21: qty 50

## 2022-02-21 MED ORDER — POTASSIUM CHLORIDE ER 10 MEQ PO TBCR: 20.0000 meq | EXTENDED_RELEASE_TABLET | Freq: Every day | ORAL | 0 refills | Status: DC

## 2022-02-21 MED ORDER — POTASSIUM CHLORIDE CRYS ER 20 MEQ PO TBCR
40.0000 meq | EXTENDED_RELEASE_TABLET | Freq: Once | ORAL | Status: AC
  Administered 2022-02-21: 40 meq via ORAL
  Filled 2022-02-21: qty 2

## 2022-02-21 MED ORDER — SODIUM CHLORIDE 0.9 % IV BOLUS
500.0000 mL | Freq: Once | INTRAVENOUS | Status: AC
  Administered 2022-02-21: 500 mL via INTRAVENOUS

## 2022-02-21 MED ORDER — SODIUM CHLORIDE 0.9 % IV SOLN
100.0000 mL/h | INTRAVENOUS | Status: DC
  Administered 2022-02-21: 100 mL/h via INTRAVENOUS

## 2022-02-21 NOTE — ED Provider Notes (Signed)
[facility] DEPT Provider Note   CSN: [id number] Arrival date & time: 02/21/22  1049     History  Chief Complaint  Patient presents with   Dizziness    Rebekah Avila is a 88 y.o. female.  Patient here with concern for high blood pressure.  She had high blood pressure was not feeling well.  Right now she does not have any symptoms.  She denies any nausea or vomiting or headache or dizziness or weakness or numbness or tingling.  She has had some adjustments to her blood pressure medications recently and she was worried about her blood pressure being high.  She denies any nausea, vomiting, weakness, chills  History of high blood pressure, diabetes, high cholesterol.  The history is provided by the patient.       Home Medications Prior to Admission medications   Medication Sig Start Date End Date Taking? Authorizing Provider  potassium chloride (KLOR-CON) 10 MEQ tablet Take 2 tablets (20 mEq total) by mouth daily for 3 days. 02/21/22 02/24/22 Yes Rosie Golson, DO  ACCU-CHEK FASTCLIX LANCETS MISC 1 Device by Does not apply route daily at 12 noon.    [provider]  acetaminophen (TYLENOL) 500 MG tablet Take 1,000 mg by mouth as needed.    [provider]  amLODipine (NORVASC) 2.5 MG tablet Take 1 tablet (2.5 mg total) by mouth daily. 02/15/22   Ngetich, Dinah C, NP  aspirin EC 81 MG tablet Take 81 mg by mouth every evening.    [provider]  Blood Glucose Monitoring Suppl (ACCU-CHEK NANO SMARTVIEW) w/Device KIT 1 strip by Does not apply route.    [provider]  Coenzyme Q10 (COQ10) 100 MG CAPS Take 1 capsule by mouth daily at 12 noon.    [provider]  fluticasone (FLONASE) 50 MCG/ACT nasal spray Place 1 spray into both nostrils daily as needed for allergies or rhinitis.    [provider]  Ginkgo Biloba (GINKOBA PO) Take 120 mg by mouth daily at 12 noon.    [provider]   metoprolol tartrate (LOPRESSOR) 50 MG tablet Take 1 tablet (50 mg total) by mouth 2 (two) times daily. 12/24/21   Lauree Chandler, NP  Omega-3 Fatty Acids (FISH OIL) 1000 MG CAPS Take 1,000 mg by mouth daily with breakfast.    [provider]  pravastatin (PRAVACHOL) 80 MG tablet One tab po M-F, skip weekends 12/24/21   Lauree Chandler, NP      Allergies    Morphine and related    Review of Systems   Review of Systems  Physical Exam Updated Vital Signs BP (!) 163/80   Pulse 70   Temp 98.5 F (36.9 C)   Resp 14   SpO2 100%  Physical Exam Vitals and nursing note reviewed.  Constitutional:      General: She is not in acute distress.    Appearance: She is well-developed. She is not ill-appearing.  HENT:     Head: Normocephalic and atraumatic.     Nose: Nose normal.     Mouth/Throat:     Mouth: Mucous membranes are moist.  Eyes:     Extraocular Movements: Extraocular movements intact.     Conjunctiva/sclera: Conjunctivae normal.     Pupils: Pupils are equal, round, and reactive to light.  Cardiovascular:     Rate and Rhythm: Normal rate and regular rhythm.     Pulses: Normal pulses.     Heart sounds: Normal  heart sounds. No murmur heard. Pulmonary:     Effort: Pulmonary effort is normal. No respiratory distress.     Breath sounds: Normal breath sounds.  Abdominal:     Palpations: Abdomen is soft.     Tenderness: There is no abdominal tenderness.  Musculoskeletal:        General: No swelling.     Cervical back: Normal range of motion and neck supple.  Skin:    General: Skin is warm and dry.     Capillary Refill: Capillary refill takes less than 2 seconds.  Neurological:     General: No focal deficit present.     Mental Status: She is alert and oriented to person, place, and time.     Cranial Nerves: No cranial nerve deficit.     Sensory: No sensory deficit.     Motor: No weakness.     Coordination: Coordination normal.     Comments: 5+ out of 5 strength  throughout, normal sensation, no drift, normal finger-nose-finger, normal speech  Psychiatric:        Mood and Affect: Mood normal.     ED Results / Procedures / Treatments   Labs (all labs ordered are listed, but only abnormal results are displayed) Labs Reviewed  CBC - Abnormal; Notable for the following components:      Result Value   RBC 3.60 (*)    Hemoglobin 11.2 (*)    HCT 33.6 (*)    All other components within normal limits  COMPREHENSIVE METABOLIC PANEL - Abnormal; Notable for the following components:   Potassium 2.4 (*)    Glucose, Bld 129 (*)    Total Protein 8.3 (*)    Total Bilirubin 1.3 (*)    All other components within normal limits  MAGNESIUM - Abnormal; Notable for the following components:   Magnesium 1.5 (*)    All other components within normal limits  BASIC METABOLIC PANEL - Abnormal; Notable for the following components:   Potassium 3.2 (*)    Glucose, Bld 115 (*)    All other components within normal limits  PROTIME-INR  APTT  DIFFERENTIAL  ETHANOL  URINALYSIS, ROUTINE W REFLEX MICROSCOPIC    EKG EKG Interpretation  Date/Time:  Monday February 21 2022 11:45:57 EDT Ventricular Rate:  78 PR Interval:  50 QRS Duration: 148 QT Interval:  409 QTC Calculation: 466 R Axis:   -9 Text Interpretation: Sinus rhythm Short PR interval Nonspecific intraventricular conduction delay Borderline abnrm T, anterolateral leads Artifact in lead(s) I II III aVR aVL aVF V1 V2 Artifact Confirmed by Gareth Morgan [phone]) on 02/21/2022 1:17:26 PM  Radiology CT HEAD WO CONTRAST  Result Date: 02/21/2022 CLINICAL DATA:  Dizziness, altered mental status. EXAM: CT HEAD WITHOUT CONTRAST TECHNIQUE: Contiguous axial images were obtained from the base of the skull through the vertex without intravenous contrast. RADIATION DOSE REDUCTION: This exam was performed according to the departmental dose-optimization program which includes automated exposure control, adjustment of  the mA and/or kV according to patient size and/or use of iterative reconstruction technique. COMPARISON:  November 21, 2021. FINDINGS: Brain: Mild chronic ischemic white matter disease is noted. No mass effect or midline shift is noted. Ventricular size is within normal limits. There is no evidence of mass lesion, hemorrhage or acute infarction. Vascular: No hyperdense vessel or unexpected calcification. Skull: Normal. Negative for fracture or focal lesion. Sinuses/Orbits: No acute finding. Other: None. IMPRESSION: No acute intracranial abnormality seen. Electronically Signed   By: Bobbe Medico.D.  On: 02/21/2022 11:53    Procedures Procedures    Medications Ordered in ED Medications  sodium chloride 0.9 % bolus 500 mL (0 mLs Intravenous Stopped 02/21/22 2229)    Followed by  0.9 %  sodium chloride infusion (0 mL/hr Intravenous Stopped 02/21/22 2230)  potassium chloride SA (KLOR-CON M) CR tablet 40 mEq (40 mEq Oral Given 02/21/22 2012)  potassium chloride 10 mEq in 100 mL IVPB (0 mEq Intravenous Stopped 02/21/22 2230)  metoprolol tartrate (LOPRESSOR) tablet 50 mg (50 mg Oral Given 02/21/22 2009)  amLODipine (NORVASC) tablet 2.5 mg (2.5 mg Oral Given 02/21/22 2010)  magnesium sulfate IVPB 2 g 50 mL (0 g Intravenous Stopped 02/21/22 2230)    ED Course/ Medical Decision Making/ A&P                           Medical Decision Making Amount and/or Complexity of Data Reviewed Labs: ordered.  Risk Prescription drug management.   [name] is here with weakness and high blood pressure.  Blood pressure mildly elevated but otherwise normal vitals.  History of hypertension and diabetes.  Patient with normal neurological exam.  She is all overall asymptomatic.  No chest pain or shortness of breath.  She was nervous about her blood pressure due to recent medication changes including decreasing her amlodipine due to swelling in her legs.  She denies any nausea or vomiting or tingling.   Neurologically she is intact.  She is already had basic labs including head CT ordered prior to my evaluation.  EKG shows sinus rhythm, no ischemic changes per my review and interpretation.  Differential diagnosis likely asymptomatic hypertension, electrolyte abnormality, dehydration.  Per my review interpretation of labs she has a potassium of 2.4 but otherwise there is no significant anemia, electrolyte abnormality, kidney injury.  Will replete potassium both orally and IV and recheck.  This seems fairly incidental.  We will give her her home doses of her blood pressure medication as she has not taken any today.  Her head CT per radiology report is unremarkable.  Potassium rechecked and improved to 3.2.  Discharged in good condition.  We will have her take potassium for the next few days.  Recommend follow-up with her primary care doctor for further blood pressure management.  This chart was dictated using voice recognition software.  Despite best efforts to proofread,  errors can occur which can change the documentation meaning.         Final Clinical Impression(s) / ED Diagnoses Final diagnoses:  Hypokalemia  Secondary hypertension    Rx / DC Orders ED Discharge Orders          Ordered    potassium chloride (KLOR-CON) 10 MEQ tablet  Daily        02/21/22 2315              Lennice Sites, DO 02/21/22 2322

## 2022-02-21 NOTE — ED Provider Triage Note (Signed)
Emergency Medicine Provider Triage Evaluation Note  REMMIE BEMBENEK , a 88 y.o. female  was evaluated in triage.  Pt complains of dizziness.  Review of Systems  Positive: dizzy Negative: No syncope, no weakness  Physical Exam  BP (!) 159/86 (BP Location: Left Arm)   Pulse 86   Temp 97.9 F (36.6 C) (Oral)   Resp 16   SpO2 100%  Gen:   Awake, no distress   Resp:  Normal effort  MSK:   Moves extremities without difficulty  Other:  Pleasantly interactive  Medical Decision Making  Medically screening exam initiated at 11:24 AM.  Appropriate orders placed.  [name] was informed that the remainder of the evaluation will be completed by another provider, this initial triage assessment does not replace that evaluation, and the importance of remaining in the ED until their evaluation is complete.   Carmin Muskrat, MD 02/21/22 1124

## 2022-02-21 NOTE — Discharge Instructions (Signed)
Follow up with your primary care doctor.

## 2022-02-21 NOTE — ED Triage Notes (Signed)
Pt reports intermittent dizziness for weeks. Pt reports seeing PCP for this and has been making med changes without relief.

## 2022-02-27 NOTE — Progress Notes (Signed)
  This encounter was created in error - please disregard. No show 

## 2022-03-01 ENCOUNTER — Ambulatory Visit (INDEPENDENT_AMBULATORY_CARE_PROVIDER_SITE_OTHER): Payer: Medicare Other | Admitting: Family

## 2022-03-01 ENCOUNTER — Encounter: Payer: Self-pay | Admitting: Family

## 2022-03-01 VITALS — BP 160/80 | HR 76 | Temp 97.3°F | Resp 16 | Ht 60.05 in | Wt 112.0 lb

## 2022-03-01 DIAGNOSIS — E876 Hypokalemia: Secondary | ICD-10-CM | POA: Diagnosis not present

## 2022-03-01 DIAGNOSIS — I10 Essential (primary) hypertension: Secondary | ICD-10-CM

## 2022-03-01 LAB — BASIC METABOLIC PANEL WITH GFR
BUN: 9 mg/dL (ref 7–25)
CO2: 29 mmol/L (ref 20–32)
Calcium: 9.7 mg/dL (ref 8.6–10.4)
Chloride: 104 mmol/L (ref 98–110)
Creat: 0.91 mg/dL (ref 0.60–0.95)
Glucose, Bld: 101 mg/dL — ABNORMAL HIGH (ref 65–99)
Potassium: 3.4 mmol/L — ABNORMAL LOW (ref 3.5–5.3)
Sodium: 144 mmol/L (ref 135–146)
eGFR: 61 mL/min/{1.73_m2} (ref >=60)

## 2022-03-01 MED ORDER — LOSARTAN POTASSIUM 25 MG PO TABS: 25.0000 mg | ORAL_TABLET | Freq: Every day | ORAL | 0 refills | Status: AC

## 2022-03-01 NOTE — Progress Notes (Signed)
Provider: Clois Montavon FNP-C  Lauree Chandler, NP  Patient Care Team: Lauree Chandler, NP as PCP - General (Geriatric Medicine) Mayford Knife, [facility] (Pharmacist) Warden Fillers, MD as Consulting Physician (Ophthalmology)  Extended Emergency Contact Information Primary Emergency Contact: Ludwig,Gail Mobile Phone: [phone] Relation: Daughter Secondary Emergency Contact: Pennix,Vanessa Mobile Phone: [phone] Relation: Niece  Code Status:  DNR Goals of care: Advanced Directive information    03/01/2022   10:52 AM  Advanced Directives  Does Patient Have a Medical Advance Directive? No  Would patient like information on creating a medical advance directive? No - Patient declined     Chief Complaint  Patient presents with   Follow-up    2 week follow up on blood pressure.     HPI:  Pt is a 88 y.o. female seen today for an acute visit for 2 weeks follow up for high blood pressure.states was seen for high blood pressure and dizziness.Her potassium was 2.4 she was given potassium IV and oral.states dizziness has resolved. Brought in her home B/p readings which are still high corresponding with today's reading in the office 140's/60's - 150's/70's with few readings in the 180's/80's and HR in the 60's-80's.  She denies any headache,dizziness,vision changes,fatigue,chest tightness,palpitation,chest pain or shortness of breath.      Past Medical History:  Diagnosis Date   Diabetes ([place])    High cholesterol    Hypertension    Polymyalgia rheumatica (HCC)    Renal disorder    Past Surgical History:  Procedure Laterality Date   ECTOPIC PREGNANCY SURGERY     HERNIA REPAIR      Allergies  Allergen Reactions   Morphine And Related Rash and Other (See Comments)    Syncope, also    Outpatient Encounter Medications as of 03/01/2022  Medication Sig   ACCU-CHEK FASTCLIX LANCETS MISC 1 Device by Does not apply route daily at 12 noon.   acetaminophen  (TYLENOL) 500 MG tablet Take 1,000 mg by mouth as needed.   amLODipine (NORVASC) 2.5 MG tablet Take 1 tablet (2.5 mg total) by mouth daily.   aspirin EC 81 MG tablet Take 81 mg by mouth every evening.   Blood Glucose Monitoring Suppl (ACCU-CHEK NANO SMARTVIEW) w/Device KIT 1 strip by Does not apply route.   Coenzyme Q10 (COQ10) 100 MG CAPS Take 1 capsule by mouth daily at 12 noon.   fluticasone (FLONASE) 50 MCG/ACT nasal spray Place 1 spray into both nostrils daily as needed for allergies or rhinitis.   Ginkgo Biloba (GINKOBA PO) Take 120 mg by mouth daily at 12 noon.   metoprolol tartrate (LOPRESSOR) 50 MG tablet Take 1 tablet (50 mg total) by mouth 2 (two) times daily.   Omega-3 Fatty Acids (FISH OIL) 1000 MG CAPS Take 1,000 mg by mouth daily with breakfast.   pravastatin (PRAVACHOL) 80 MG tablet One tab po M-F, skip weekends   [DISCONTINUED] potassium chloride (KLOR-CON) 10 MEQ tablet Take 2 tablets (20 mEq total) by mouth daily for 3 days.   No facility-administered encounter medications on file as of 03/01/2022.    Review of Systems  Constitutional:  Negative for appetite change, chills, fatigue, fever and unexpected weight change.  HENT:  Negative for congestion, dental problem, ear discharge, hearing loss, nosebleeds, postnasal drip, rhinorrhea, sinus pressure, sinus pain, sneezing, sore throat, tinnitus and trouble swallowing.   Eyes:  Negative for pain, discharge, redness, itching and visual disturbance.  Respiratory:  Negative for cough, chest tightness, shortness of breath and wheezing.  Cardiovascular:  Positive for leg swelling. Negative for chest pain and palpitations.  Gastrointestinal:  Negative for abdominal distention, abdominal pain, constipation, diarrhea, nausea and vomiting.  Genitourinary:  Negative for difficulty urinating, dysuria, flank pain, frequency and urgency.  Musculoskeletal:  Negative for arthralgias, back pain, gait problem, joint swelling, myalgias, neck  pain and neck stiffness.  Skin:  Negative for color change, pallor and rash.  Neurological:  Negative for dizziness, syncope, speech difficulty, weakness, light-headedness, numbness and headaches.  Hematological:  Does not bruise/bleed easily.  Psychiatric/Behavioral:  Negative for agitation, behavioral problems, confusion and sleep disturbance. The patient is not nervous/anxious.     Immunization History  Administered Date(s) Administered   Fluad Quad(high Dose 65+) 02/11/2020, 05/12/2021, 02/15/2022   Influenza, High Dose Seasonal PF 02/05/2018, 12/17/2018   PFIZER(Purple Top)SARS-COV-2 Vaccination 05/18/2019, 06/21/2019, 02/08/2020, 09/08/2020   PNEUMOCOCCAL CONJUGATE-20 07/22/2021   Pfizer Covid-19 Vaccine Bivalent Booster 68yr & up 02/05/2021   Pneumococcal Polysaccharide-23 08/19/2019   Pertinent  Health Maintenance Due  Topic Date Due   OPHTHALMOLOGY EXAM  05/18/2022   HEMOGLOBIN A1C  05/19/2022   FOOT EXAM  11/17/2022   INFLUENZA VACCINE  Completed   DEXA SCAN  Completed      11/21/2021    9:26 PM 11/22/2021    7:47 AM 12/24/2021   10:10 AM 02/15/2022    2:52 PM 03/01/2022   10:52 AM  Fall Risk  Falls in the past year?   0 0 0  Was there an injury with Fall?   0 0 0  Fall Risk Category Calculator   0 0 0  Fall Risk Category   Low Low Low  Patient Fall Risk Level Moderate fall risk Moderate fall risk Low fall risk Low fall risk Low fall risk  Patient at Risk for Falls Due to   No Fall Risks No Fall Risks No Fall Risks  Fall risk Follow up   Falls evaluation completed Falls evaluation completed Falls evaluation completed   Functional Status Survey:    Vitals:   03/01/22 1048  BP: (!) 160/80  Pulse: 76  Resp: 16  Temp: (!) 97.3 F (36.3 C)  SpO2: 99%  Weight: 112 lb (50.8 kg)  Height: 5' 0.05" (1.525 m)   Body mass index is 21.84 kg/m. Physical Exam Vitals reviewed.  Constitutional:      General: She is not in acute distress.    Appearance: Normal  appearance. She is normal weight. She is not ill-appearing or diaphoretic.  HENT:     Head: Normocephalic.     Nose: Nose normal. No congestion or rhinorrhea.     Mouth/Throat:     Mouth: Mucous membranes are moist.     Pharynx: Oropharynx is clear. No oropharyngeal exudate or posterior oropharyngeal erythema.  Eyes:     General: No scleral icterus.       Right eye: No discharge.        Left eye: No discharge.     Conjunctiva/sclera: Conjunctivae normal.     Pupils: Pupils are equal, round, and reactive to light.  Neck:     Vascular: No carotid bruit.  Cardiovascular:     Rate and Rhythm: Normal rate and regular rhythm.     Pulses: Normal pulses.     Heart sounds: Normal heart sounds. No murmur heard.    No friction rub. No gallop.  Pulmonary:     Effort: Pulmonary effort is normal. No respiratory distress.     Breath sounds: Normal breath sounds. No  wheezing, rhonchi or rales.  Chest:     Chest wall: No tenderness.  Abdominal:     General: Bowel sounds are normal. There is no distension.     Palpations: Abdomen is soft. There is no mass.     Tenderness: There is no abdominal tenderness. There is no right CVA tenderness, left CVA tenderness, guarding or rebound.  Musculoskeletal:        General: No swelling or tenderness. Normal range of motion.     Cervical back: Normal range of motion. No rigidity or tenderness.     Right lower leg: No edema.     Left lower leg: No edema.  Lymphadenopathy:     Cervical: No cervical adenopathy.  Skin:    General: Skin is warm and dry.     Coloration: Skin is not pale.     Findings: No erythema or rash.  Neurological:     Mental Status: She is alert and oriented to person, place, and time.     Motor: No weakness.     Gait: Gait normal.  Psychiatric:        Mood and Affect: Mood normal.        Speech: Speech normal.        Behavior: Behavior normal.     Labs reviewed: Recent Labs    12/24/21 1112 02/21/22 1157 02/21/22 1930  02/21/22 2228  NA 144 140  --  141  K 3.5 2.4*  --  3.2*  CL 107 102  --  107  CO2 24 25  --  24  GLUCOSE 115* 129*  --  115*  BUN 9 10  --  8  CREATININE 1.00* 0.88  --  0.69  CALCIUM 9.3 9.2  --  9.0  MG  --   --  1.5*  --    Recent Labs    11/16/21 1214 11/21/21 1453 12/24/21 1112 02/21/22 1157  AST 29 34 22 28  ALT _0 ALKPHOS 39* 37*  --  43  BILITOT 0.7 1.3* 1.2 1.3*  PROT 8.0 9.4* 6.8 8.3*  ALBUMIN 4.6 4.8  --  4.3   Recent Labs    11/21/21 1453 12/24/21 1112 02/21/22 1157  WBC 6.5 5.1 7.5  NEUTROABS 4.0 3,473 5.7  HGB 11.9* 10.7* 11.2*  HCT 35.7* 32.0* 33.6*  MCV 90.4 90.9 93.3  PLT 264 256 225   Lab Results  Component Value Date   TSH 2.510 07/20/2020   Lab Results  Component Value Date   HGBA1C 6.5 (H) 11/16/2021   Lab Results  Component Value Date   CHOL 149 11/16/2021   HDL 36 (L) 11/16/2021   LDLCALC 96 11/16/2021   TRIG 89 11/16/2021   CHOLHDL 4.1 11/16/2021    Significant Diagnostic Results in last 30 days:  CT HEAD WO CONTRAST  Result Date: 02/21/2022 CLINICAL DATA:  Dizziness, altered mental status. EXAM: CT HEAD WITHOUT CONTRAST TECHNIQUE: Contiguous axial images were obtained from the base of the skull through the vertex without intravenous contrast. RADIATION DOSE REDUCTION: This exam was performed according to the departmental dose-optimization program which includes automated exposure control, adjustment of the mA and/or kV according to patient size and/or use of iterative reconstruction technique. COMPARISON:  November 21, 2021. FINDINGS: Brain: Mild chronic ischemic white matter disease is noted. No mass effect or midline shift is noted. Ventricular size is within normal limits. There is no evidence of mass lesion, hemorrhage or acute infarction. Vascular: No hyperdense vessel  or unexpected calcification. Skull: Normal. Negative for fracture or focal lesion. Sinuses/Orbits: No acute finding. Other: None. IMPRESSION: No acute  intracranial abnormality seen. Electronically Signed   By: Marijo Conception M.D.   On: 02/21/2022 11:53    Assessment/Plan  1. Primary hypertension Asymptomatic  B/p still elevated s/p ED visit 02/21/2022 for High blood pressure,dizziness and hypokalemia. - lower extremities edema  - advised to discontinue Amlodipine 2.5 mg tablet then start on losartan as below  - will f/u in 4 weeks to recheck B/p  - continue on metoprolol  - losartan (COZAAR) 25 MG tablet; Take 1 tablet (25 mg total) by mouth daily.  Dispense: 90 tablet; Refill: 0  2. Hypokalemia K+ 2.4 in ED was replete. Will recheck level today  - BMP with eGFR([facility])  Family/ staff Communication: Reviewed plan of care with patient verbalized understanding   Labs/tests ordered: - BMP with eGFR([facility])  Next Appointment: Return in about 4 weeks (around 03/29/2022) for Blood presure .   Sandrea Hughs, NP

## 2022-03-02 ENCOUNTER — Other Ambulatory Visit: Payer: Self-pay

## 2022-03-04 ENCOUNTER — Other Ambulatory Visit: Payer: Self-pay

## 2022-03-04 MED ORDER — POTASSIUM CHLORIDE CRYS ER 20 MEQ PO TBCR: 20.0000 meq | EXTENDED_RELEASE_TABLET | Freq: Two times a day (BID) | ORAL | 0 refills | Status: DC

## 2022-03-05 ENCOUNTER — Emergency Department (HOSPITAL_COMMUNITY): Payer: Medicare Other

## 2022-03-05 ENCOUNTER — Other Ambulatory Visit: Payer: Self-pay

## 2022-03-05 ENCOUNTER — Encounter (HOSPITAL_COMMUNITY): Payer: Self-pay | Admitting: *Deleted

## 2022-03-05 ENCOUNTER — Observation Stay (HOSPITAL_COMMUNITY): Payer: Medicare Other

## 2022-03-05 ENCOUNTER — Inpatient Hospital Stay (HOSPITAL_COMMUNITY)
Admission: EM | Admit: 2022-03-05 | Discharge: 2022-03-14 | DRG: 101 | Disposition: A | Discharge status: Skilled Nursing Facility | Payer: Medicare Other | Attending: Internal Medicine | Admitting: Internal Medicine

## 2022-03-05 DIAGNOSIS — R4701 Aphasia: Secondary | ICD-10-CM | POA: Diagnosis present

## 2022-03-05 DIAGNOSIS — H53461 Homonymous bilateral field defects, right side: Secondary | ICD-10-CM | POA: Diagnosis not present

## 2022-03-05 DIAGNOSIS — R414 Neurologic neglect syndrome: Secondary | ICD-10-CM | POA: Diagnosis not present

## 2022-03-05 DIAGNOSIS — Z8249 Family history of ischemic heart disease and other diseases of the circulatory system: Secondary | ICD-10-CM | POA: Diagnosis not present

## 2022-03-05 DIAGNOSIS — E7800 Pure hypercholesterolemia, unspecified: Secondary | ICD-10-CM | POA: Diagnosis present

## 2022-03-05 DIAGNOSIS — E871 Hypo-osmolality and hyponatremia: Secondary | ICD-10-CM | POA: Diagnosis not present

## 2022-03-05 DIAGNOSIS — Z885 Allergy status to narcotic agent status: Secondary | ICD-10-CM

## 2022-03-05 DIAGNOSIS — M6259 Muscle wasting and atrophy, not elsewhere classified, multiple sites: Secondary | ICD-10-CM | POA: Diagnosis not present

## 2022-03-05 DIAGNOSIS — E876 Hypokalemia: Secondary | ICD-10-CM | POA: Diagnosis present

## 2022-03-05 DIAGNOSIS — I639 Cerebral infarction, unspecified: Secondary | ICD-10-CM | POA: Diagnosis not present

## 2022-03-05 DIAGNOSIS — M353 Polymyalgia rheumatica: Secondary | ICD-10-CM | POA: Diagnosis not present

## 2022-03-05 DIAGNOSIS — E785 Hyperlipidemia, unspecified: Secondary | ICD-10-CM | POA: Diagnosis not present

## 2022-03-05 DIAGNOSIS — Z7189 Other specified counseling: Secondary | ICD-10-CM

## 2022-03-05 DIAGNOSIS — G9340 Encephalopathy, unspecified: Secondary | ICD-10-CM | POA: Diagnosis not present

## 2022-03-05 DIAGNOSIS — D638 Anemia in other chronic diseases classified elsewhere: Secondary | ICD-10-CM | POA: Diagnosis present

## 2022-03-05 DIAGNOSIS — R2681 Unsteadiness on feet: Secondary | ICD-10-CM | POA: Diagnosis not present

## 2022-03-05 DIAGNOSIS — H518 Other specified disorders of binocular movement: Secondary | ICD-10-CM | POA: Diagnosis not present

## 2022-03-05 DIAGNOSIS — Z66 Do not resuscitate: Secondary | ICD-10-CM | POA: Diagnosis not present

## 2022-03-05 DIAGNOSIS — Z7982 Long term (current) use of aspirin: Secondary | ICD-10-CM | POA: Diagnosis not present

## 2022-03-05 DIAGNOSIS — I10 Essential (primary) hypertension: Secondary | ICD-10-CM | POA: Diagnosis present

## 2022-03-05 DIAGNOSIS — R41841 Cognitive communication deficit: Secondary | ICD-10-CM | POA: Diagnosis not present

## 2022-03-05 DIAGNOSIS — R410 Disorientation, unspecified: Secondary | ICD-10-CM | POA: Diagnosis not present

## 2022-03-05 DIAGNOSIS — R339 Retention of urine, unspecified: Secondary | ICD-10-CM | POA: Diagnosis not present

## 2022-03-05 DIAGNOSIS — R569 Unspecified convulsions: Principal | ICD-10-CM | POA: Diagnosis present

## 2022-03-05 DIAGNOSIS — R2990 Unspecified symptoms and signs involving the nervous system: Secondary | ICD-10-CM | POA: Insufficient documentation

## 2022-03-05 DIAGNOSIS — R1311 Dysphagia, oral phase: Secondary | ICD-10-CM | POA: Diagnosis not present

## 2022-03-05 DIAGNOSIS — E1122 Type 2 diabetes mellitus with diabetic chronic kidney disease: Secondary | ICD-10-CM | POA: Diagnosis not present

## 2022-03-05 DIAGNOSIS — Z79899 Other long term (current) drug therapy: Secondary | ICD-10-CM

## 2022-03-05 DIAGNOSIS — E1169 Type 2 diabetes mellitus with other specified complication: Secondary | ICD-10-CM

## 2022-03-05 DIAGNOSIS — R4182 Altered mental status, unspecified: Secondary | ICD-10-CM | POA: Diagnosis not present

## 2022-03-05 DIAGNOSIS — E119 Type 2 diabetes mellitus without complications: Secondary | ICD-10-CM | POA: Diagnosis not present

## 2022-03-05 DIAGNOSIS — Z741 Need for assistance with personal care: Secondary | ICD-10-CM | POA: Diagnosis not present

## 2022-03-05 DIAGNOSIS — M6281 Muscle weakness (generalized): Secondary | ICD-10-CM | POA: Diagnosis not present

## 2022-03-05 DIAGNOSIS — Z515 Encounter for palliative care: Secondary | ICD-10-CM | POA: Diagnosis not present

## 2022-03-05 DIAGNOSIS — R42 Dizziness and giddiness: Secondary | ICD-10-CM | POA: Diagnosis not present

## 2022-03-05 DIAGNOSIS — B379 Candidiasis, unspecified: Secondary | ICD-10-CM | POA: Diagnosis not present

## 2022-03-05 LAB — DIFFERENTIAL
Abs Immature Granulocytes: 0.01 10*3/uL (ref 0.00–0.07)
Basophils Absolute: 0 10*3/uL (ref 0.0–0.1)
Basophils Relative: 0 %
Eosinophils Absolute: 0 10*3/uL (ref 0.0–0.5)
Eosinophils Relative: 0 %
Immature Granulocytes: 0 %
Lymphocytes Relative: 12 %
Lymphs Abs: 0.8 10*3/uL (ref 0.7–4.0)
Monocytes Absolute: 0.6 10*3/uL (ref 0.1–1.0)
Monocytes Relative: 8 %
Neutro Abs: 5.2 10*3/uL (ref 1.7–7.7)
Neutrophils Relative %: 80 %

## 2022-03-05 LAB — I-STAT CHEM 8, ED
BUN: 6 mg/dL — ABNORMAL LOW (ref 8–23)
Calcium, Ion: 0.91 mmol/L — ABNORMAL LOW (ref 1.15–1.40)
Chloride: 100 mmol/L (ref 98–111)
Creatinine, Ser: 0.9 mg/dL (ref 0.44–1.00)
Glucose, Bld: 101 mg/dL — ABNORMAL HIGH (ref 70–99)
HCT: 26 % — ABNORMAL LOW (ref 36.0–46.0)
Hemoglobin: 8.8 g/dL — ABNORMAL LOW (ref 12.0–15.0)
Potassium: 3 mmol/L — ABNORMAL LOW (ref 3.5–5.1)
Sodium: 141 mmol/L (ref 135–145)
TCO2: 24 mmol/L (ref 22–32)

## 2022-03-05 LAB — COMPREHENSIVE METABOLIC PANEL
ALT: 11 U/L (ref 0–44)
AST: 23 U/L (ref 15–41)
Albumin: 3.4 g/dL — ABNORMAL LOW (ref 3.5–5.0)
Alkaline Phosphatase: 32 U/L — ABNORMAL LOW (ref 38–126)
Anion gap: 12 (ref 5–15)
BUN: 7 mg/dL — ABNORMAL LOW (ref 8–23)
CO2: 26 mmol/L (ref 22–32)
Calcium: 8.7 mg/dL — ABNORMAL LOW (ref 8.9–10.3)
Chloride: 103 mmol/L (ref 98–111)
Creatinine, Ser: 0.93 mg/dL (ref 0.44–1.00)
GFR, Estimated: 59 mL/min — ABNORMAL LOW (ref >60)
Glucose, Bld: 107 mg/dL — ABNORMAL HIGH (ref 70–99)
Potassium: 3 mmol/L — ABNORMAL LOW (ref 3.5–5.1)
Sodium: 141 mmol/L (ref 135–145)
Total Bilirubin: 0.8 mg/dL (ref 0.3–1.2)
Total Protein: 6.5 g/dL (ref 6.5–8.1)

## 2022-03-05 LAB — CBC
HCT: 26.8 % — ABNORMAL LOW (ref 36.0–46.0)
Hemoglobin: 9.1 g/dL — ABNORMAL LOW (ref 12.0–15.0)
MCH: 31.3 pg (ref 26.0–34.0)
MCHC: 34 g/dL (ref 30.0–36.0)
MCV: 92.1 fL (ref 80.0–100.0)
Platelets: 204 10*3/uL (ref 150–400)
RBC: 2.91 MIL/uL — ABNORMAL LOW (ref 3.87–5.11)
RDW: 13 % (ref 11.5–15.5)
WBC: 6.6 10*3/uL (ref 4.0–10.5)
nRBC: 0 % (ref 0.0–0.2)

## 2022-03-05 LAB — APTT: aPTT: 25 seconds (ref 24–36)

## 2022-03-05 LAB — CBG MONITORING, ED: Glucose-Capillary: 110 mg/dL — ABNORMAL HIGH (ref 70–99)

## 2022-03-05 LAB — PROTIME-INR
INR: 1.2 (ref 0.8–1.2)
Prothrombin Time: 14.9 seconds (ref 11.4–15.2)

## 2022-03-05 MED ORDER — ENOXAPARIN SODIUM 40 MG/0.4ML IJ SOSY
40.0000 mg | PREFILLED_SYRINGE | INTRAMUSCULAR | Status: DC
  Administered 2022-03-05 – 2022-03-06 (×2): 40 mg via SUBCUTANEOUS
  Filled 2022-03-05 (×2): qty 0.4

## 2022-03-05 MED ORDER — STROKE: EARLY STAGES OF RECOVERY BOOK: Freq: Once | Status: DC

## 2022-03-05 MED ORDER — SODIUM CHLORIDE 0.9 % IV SOLN: INTRAVENOUS | Status: DC

## 2022-03-05 MED ORDER — ACETAMINOPHEN 160 MG/5ML PO SOLN: 650.0000 mg | ORAL | Status: DC | PRN

## 2022-03-05 MED ORDER — ASPIRIN 81 MG PO TBEC
81.0000 mg | DELAYED_RELEASE_TABLET | Freq: Every evening | ORAL | Status: DC
  Administered 2022-03-09 – 2022-03-10 (×2): 81 mg via ORAL
  Filled 2022-03-05 (×3): qty 1

## 2022-03-05 MED ORDER — LEVETIRACETAM IN NACL 500 MG/100ML IV SOLN: 500.0000 mg | Freq: Two times a day (BID) | INTRAVENOUS | Status: DC

## 2022-03-05 MED ORDER — CALCIUM GLUCONATE-NACL 1-0.675 GM/50ML-% IV SOLN
1.0000 g | Freq: Once | INTRAVENOUS | Status: AC
  Administered 2022-03-05: 1000 mg via INTRAVENOUS
  Filled 2022-03-05: qty 50

## 2022-03-05 MED ORDER — ASPIRIN 300 MG RE SUPP: 300.0000 mg | Freq: Once | RECTAL | Status: DC

## 2022-03-05 MED ORDER — ACETAMINOPHEN 650 MG RE SUPP: 650.0000 mg | RECTAL | Status: DC | PRN

## 2022-03-05 MED ORDER — LEVETIRACETAM IN NACL 1000 MG/100ML IV SOLN
1000.0000 mg | Freq: Once | INTRAVENOUS | Status: AC
  Administered 2022-03-05: 1000 mg via INTRAVENOUS
  Filled 2022-03-05: qty 100

## 2022-03-05 MED ORDER — SODIUM CHLORIDE 0.9% FLUSH: 3.0000 mL | Freq: Once | INTRAVENOUS | Status: DC

## 2022-03-05 MED ORDER — POTASSIUM CHLORIDE CRYS ER 20 MEQ PO TBCR: 40.0000 meq | EXTENDED_RELEASE_TABLET | Freq: Once | ORAL | Status: DC

## 2022-03-05 MED ORDER — HYDRALAZINE HCL 20 MG/ML IJ SOLN: 10.0000 mg | INTRAMUSCULAR | Status: DC | PRN

## 2022-03-05 MED ORDER — ACETAMINOPHEN 325 MG PO TABS: 650.0000 mg | ORAL_TABLET | ORAL | Status: DC | PRN

## 2022-03-05 MED ORDER — POTASSIUM CHLORIDE 10 MEQ/100ML IV SOLN
10.0000 meq | INTRAVENOUS | Status: AC
  Administered 2022-03-05 (×3): 10 meq via INTRAVENOUS
  Filled 2022-03-05 (×4): qty 100

## 2022-03-05 MED ORDER — IOHEXOL 350 MG/ML SOLN
100.0000 mL | Freq: Once | INTRAVENOUS | Status: AC | PRN
  Administered 2022-03-05: 100 mL via INTRAVENOUS

## 2022-03-05 MED ORDER — ASPIRIN 81 MG PO CHEW: 324.0000 mg | CHEWABLE_TABLET | Freq: Once | ORAL | Status: DC

## 2022-03-05 NOTE — H&P (Addendum)
History and Physical    Patient: Rebekah Avila WCB:[id number] DOB: March 22, 1932 DOA: 03/05/2022 DOS: the patient was seen and examined on 03/05/2022 PCP: Lauree Chandler, NP  Patient coming from: Home via EMS  Chief Complaint:  Chief Complaint  Patient presents with   Code Stroke   HPI: Rebekah Avila is a 88 y.o. female with medical history significant of hypertension, hyperlipidemia, diabetes mellitus type 2, and polymyalgia rheumatica who presents after being found to be acutely altered.  At baseline patient lives alone and is able to complete all of her ADLs without assistance.  One of her daughters lives approximately 2 doors down and she has a neighbor who checks on her from time to time.  Patient had been noted to be in her normal state of health yesterday evening.  Today the patient's home alarm system went off and when EMS arrived found her to naked and confused with difficulty speaking.  They also noted patient had left gaze preference with right-sided neglect.  Patient presented as a code stroke.  Patient was noted to be afebrile with mild tachypnea and blood pressure was elevated up to 176/78.  CT scan of the brain did not note any acute abnormality.  She was outside the window for thrombolytics.  Labs noted hemoglobin 8.8, potassium 3, and ionized calcium 0.91.  CT angiogram of the head and neck did not note any large vessel occlusion.  Patient was set up on continuous EEG monitoring ordered.  Patient has been given full dose aspirin, loaded with Keppra 1000 mg IV, and given potassium chloride 40 mEq p.o.  Review of Systems: unable to review all systems due to the inability of the patient to answer questions. Past Medical History:  Diagnosis Date   Diabetes ([place])    High cholesterol    Hypertension    Polymyalgia rheumatica ([place])    Renal disorder    Past Surgical History:  Procedure Laterality Date   ECTOPIC PREGNANCY SURGERY     HERNIA REPAIR     Social History:   reports that she has never smoked. She has never used smokeless tobacco. She reports that she does not drink alcohol and does not use drugs.  Allergies  Allergen Reactions   Morphine And Related Rash and Other (See Comments)    Syncope, also    Family History  Problem Relation Age of Onset   Healthy Mother    Heart disease Father    Heart attack Father     Prior to Admission medications   Medication Sig Start Date End Date Taking? Authorizing Provider  ACCU-CHEK FASTCLIX LANCETS MISC 1 Device by Does not apply route daily at 12 noon.    [provider]  acetaminophen (TYLENOL) 500 MG tablet Take 1,000 mg by mouth as needed.    [provider]  aspirin EC 81 MG tablet Take 81 mg by mouth every evening.    [provider]  Blood Glucose Monitoring Suppl (ACCU-CHEK NANO SMARTVIEW) w/Device KIT 1 strip by Does not apply route.    [provider]  Coenzyme Q10 (COQ10) 100 MG CAPS Take 1 capsule by mouth daily at 12 noon.    [provider]  fluticasone (FLONASE) 50 MCG/ACT nasal spray Place 1 spray into both nostrils daily as needed for allergies or rhinitis.    [provider]  Ginkgo Biloba (GINKOBA PO) Take 120 mg by mouth daily at 12 noon.    [provider]  losartan (COZAAR) 25 MG  tablet Take 1 tablet (25 mg total) by mouth daily. 03/01/22   Ngetich, Dinah C, NP  metoprolol tartrate (LOPRESSOR) 50 MG tablet Take 1 tablet (50 mg total) by mouth 2 (two) times daily. 12/24/21   Lauree Chandler, NP  Omega-3 Fatty Acids (FISH OIL) 1000 MG CAPS Take 1,000 mg by mouth daily with breakfast.    [provider]  potassium chloride SA (KLOR-CON M) 20 MEQ tablet Take 1 tablet (20 mEq total) by mouth 2 (two) times daily. Take 2 tablets daily for 3 days 03/04/22   Ngetich, Dinah C, NP  pravastatin (PRAVACHOL) 80 MG tablet One tab po M-F, skip weekends 12/24/21   Lauree Chandler, NP    Physical Exam: Vitals:   03/05/22 1615  03/05/22 1620  BP:  (!) 176/78  Pulse:  88  Resp:  13  Temp:  98.7 F (37.1 C)  TempSrc:  Oral  SpO2:  100%  Weight: 50.8 kg   Height: 5' (1.524 m)     Constitutional: Elderly female currently in no acute distress Eyes: Left gaze preference.  Right-sided hemianopsia ENMT: Mucous membranes are dry. Posterior pharynx clear of any exudate or lesions.  Neck: normal, supple,   Respiratory: clear to auscultation bilaterally, no wheezing, no crackles. Normal respiratory effort. No accessory muscle use.  Cardiovascular: Regular rate and rhythm, no murmurs / rubs / gallops.  Trace bilateral lower extremity edema. 2+ pedal pulses. No carotid bruits.  Abdomen: no tenderness, no masses palpated. No hepatosplenomegaly. Bowel sounds positive.  Musculoskeletal: no clubbing / cyanosis. No joint deformity upper and lower extremities. Good ROM, no contractures. Normal muscle tone.  Skin: no rashes, lesions, ulcers. No induration Neurologic: CN 2-12 grossly intact.  Moves all extremities.  Able to speak but does not make sense with what she is saying. Psychiatric: Confused.  Alert, but unable to test for orientation.  Data Reviewed:  EKG reveals sinus rhythm at 88 bpm with first-degree heart block.  Reviewed labs, imaging, and pertinent records as noted above in the HPI. Assessment and Plan:  Acute encephalopathy secondary to suspected seizure Acute.  Patient presents after being noted to be acutely altered with aphasia, right-sided hemianopsia, and left gaze preference.  CT scan of the head and subsequent CTA of the head and neck did not note any signs of acute stroke or large vessel occlusion.  Patient was not a candidate for thrombolytics due to being out of the window.  Neurology question possibility of seizure versus stroke as a cause of patient's symptoms.  Patient was loaded with 1 g of Keppra, given full dose aspirin, and placed on continuous EEG monitoring. -Admit to a telemetry  bed -Neurochecks -N.p.o. until able to follow swallow eval -Add on lipid panel, hemoglobin A1c, and TSH -Follow-up continuous EEG monitoring -Follow-up MRI of the brain -PT/OT/speech to evaluate and treat -325 mg of aspirin and changed to suppository as patient not felt safe to swallow at this time. -[place] neurology consultative services,  will follow-up for any further recommendations  Hypokalemia hypocalcemia Acute.  Initial potassium 3 and ionized calcium noted to be low at 0.91.Marland Kitchen  Patient had been ordered 40 mEq of potassium chloride p.o. but unable to safely swallow. -Give potassium chloride 40 mEq IV -Give calcium gluconate 1 g IV  Essential hypertension -Allowing for permissive hypertension at this time with hydralazine IV as needed for elevated blood pressures greater than 220 -Restart home blood pressure when medically appropriate  Controlled diabetes mellitus type 2 Patient was  not on any medications for treatment.  Glucose 101 on admission.  Last hemoglobin A1c 6.5 on 11/16/2021. -Continue to monitor at this time.  Hyperlipidemia Patient had been on pravastatin 80 mg daily Monday through Friday skipping weekends. -Follow-up lipid panel -Goal LDL less than 70   DVT prophylaxis: Lovenox Advance Care Planning:   Code Status: Full Code   Consults: Neurology  Family Communication: Patient daughters updated  Severity of Illness: The appropriate patient status for this patient is INPATIENT. Inpatient status is judged to be reasonable and necessary in order to provide the required intensity of service to ensure the patient's safety. The patient's presenting symptoms, physical exam findings, and initial radiographic and laboratory data in the context of their chronic comorbidities is felt to place them at high risk for further clinical deterioration. Furthermore, it is not anticipated that the patient will be medically stable for discharge from the hospital within 2  midnights of admission.   * I certify that at the point of admission it is my clinical judgment that the patient will require inpatient hospital care spanning beyond 2 midnights from the point of admission due to high intensity of service, high risk for further deterioration and high frequency of surveillance required.*  Author: Norval Morton, MD 03/05/2022 4:46 PM  For on call review [email or web address].

## 2022-03-05 NOTE — ED Notes (Signed)
The pt did not pass her swallow screen  she is unable to follow instructions   she is also drowsy   more than  her arrival

## 2022-03-05 NOTE — ED Notes (Signed)
The pt was taken to mri at 1730  she returned at 1815  the pts daughter had arrived while we were gone  the pt immediately  responded to her daughter  and immedaitely started attempting to talk to her.  Speech is still garbled.  The daughter reports that her speech was normal and clear at 2100   much different today

## 2022-03-05 NOTE — ED Notes (Signed)
The pt connected back to the eeg on arrival to room from [facility]

## 2022-03-05 NOTE — ED Triage Notes (Signed)
The pt arrived by [facility] from  home where the pt lives alone she was last seen normal 2100   a call from her neighbor  found the pt to be more confused andshe was walking  around naked  the daughter is coming from [place]  pts speech is garbled at present  she  gives  her first and last names clearly

## 2022-03-05 NOTE — Consult Note (Signed)
Neurology Consultation  Reason for Consult: aphasia Referring Physician: Dr. Philip Aspen  CC: none  History is obtained from: EMS and chart  HPI: Rebekah Avila is a 88 y.o. female with history of hypertension, hyperlipidemia and diabetes who was last known to be well at 2100 last night when she spoke with her daughter.  Today, her home alarm system went off and EMS came to her house to find her confused and aphasic.  Exam reveals left gaze deviation and right hemianopsia as well as difficulties with word finding and confusion.  Patient is oriented to self only.  CT head was negative for acute abnormality.  CTA/P was performed and revealed no LVO and no perfusion deficit.     LKW: 11/10 2100 TNK given?: no, outside of window IR Thrombectomy? No, No LVO Modified Rankin Scale: 0-Completely asymptomatic and back to baseline post- stroke  ROS: A complete ROS was performed and is negative except as noted in the HPI.   Past Medical History:  Diagnosis Date   Diabetes ([place])    High cholesterol    Hypertension    Polymyalgia rheumatica (HCC)    Renal disorder      Family History  Problem Relation Age of Onset   Healthy Mother    Heart disease Father    Heart attack Father      Social History:   reports that she has never smoked. She has never used smokeless tobacco. She reports that she does not drink alcohol and does not use drugs.  Medications  Current Facility-Administered Medications:    sodium chloride flush (NS) 0.9 % injection 3 mL, 3 mL, Intravenous, Once, Fransico Meadow, MD  Current Outpatient Medications:    ACCU-CHEK FASTCLIX LANCETS MISC, 1 Device by Does not apply route daily at 12 noon., Disp: , Rfl:    acetaminophen (TYLENOL) 500 MG tablet, Take 1,000 mg by mouth as needed., Disp: , Rfl:    aspirin EC 81 MG tablet, Take 81 mg by mouth every evening., Disp: , Rfl:    Blood Glucose Monitoring Suppl (ACCU-CHEK NANO SMARTVIEW) w/Device KIT, 1 strip by Does not  apply route., Disp: , Rfl:    Coenzyme Q10 (COQ10) 100 MG CAPS, Take 1 capsule by mouth daily at 12 noon., Disp: , Rfl:    fluticasone (FLONASE) 50 MCG/ACT nasal spray, Place 1 spray into both nostrils daily as needed for allergies or rhinitis., Disp: , Rfl:    Ginkgo Biloba (GINKOBA PO), Take 120 mg by mouth daily at 12 noon., Disp: , Rfl:    losartan (COZAAR) 25 MG tablet, Take 1 tablet (25 mg total) by mouth daily., Disp: 90 tablet, Rfl: 0   metoprolol tartrate (LOPRESSOR) 50 MG tablet, Take 1 tablet (50 mg total) by mouth 2 (two) times daily., Disp: 180 tablet, Rfl: 2   Omega-3 Fatty Acids (FISH OIL) 1000 MG CAPS, Take 1,000 mg by mouth daily with breakfast., Disp: , Rfl:    potassium chloride SA (KLOR-CON M) 20 MEQ tablet, Take 1 tablet (20 mEq total) by mouth 2 (two) times daily. Take 2 tablets daily for 3 days, Disp: 6 tablet, Rfl: 0   pravastatin (PRAVACHOL) 80 MG tablet, One tab po M-F, skip weekends, Disp: 75 tablet, Rfl: 2   Exam: Current vital signs: There were no vitals taken for this visit. Vital signs in last 24 hours:    GENERAL: Awake, alert, in no acute distress Psych: Patient is calm and cooperative with examination Head: Normocephalic and atraumatic, without  obvious abnormality EENT: Normal conjunctivae, moist mucous membranes, no OP obstruction LUNGS: Normal respiratory effort. Non-labored breathing on room air Extremities: warm, well perfused, without obvious deformity  NEURO:  Mental Status: Awake, alert, and oriented to person only, able to follow simple commands intermittently She is not able to provide a clear and coherent history of present illness. Speech/Language: speech is nonsensical with word finding difficulties.   Unable to name or repeat, cannot answer questions or follow complex commands No neglect is noted Cranial Nerves:  II: PERRL right hemianopia III, IV, VI: Left gaze deviation  VII: Face is symmetric resting and smiling.   VIII: Hearing  intact to voice IX, X: Phonation normal.  XII: Tongue protrudes midline without fasciculations.   Motor: 5/5 strength is all muscle groups.  Tone is normal. Bulk is normal.  Sensation: Intact to light touch bilaterally in all four extremities.  Unable to test for extinction Coordination: Unable to test Gait: Deferred  NIHSS: 1a Level of Conscious.: 0 1b LOC Questions: 2 1c LOC Commands: 0 2 Best Gaze: 1 3 Visual: 2 4 Facial Palsy: 0 5a Motor Arm - left: 0 5b Motor Arm - Right: 0 6a Motor Leg - Left: 0 6b Motor Leg - Right: 0 7 Limb Ataxia: 0 8 Sensory: 0 9 Best Language: 2 10 Dysarthria: 0 11 Extinct. and Inatten.: 0 TOTAL: 7   Labs I have reviewed labs in epic and the results pertinent to this consultation are:  CBC    Component Value Date/Time   WBC 7.5 02/21/2022 1157   RBC 3.60 (L) 02/21/2022 1157   HGB 11.2 (L) 02/21/2022 1157   HGB 11.0 (L) 11/16/2021 1214   HCT 33.6 (L) 02/21/2022 1157   HCT 32.4 (L) 11/16/2021 1214   PLT 225 02/21/2022 1157   PLT 228 11/16/2021 1214   MCV 93.3 02/21/2022 1157   MCV 88 11/16/2021 1214   MCH 31.1 02/21/2022 1157   MCHC 33.3 02/21/2022 1157   RDW 13.0 02/21/2022 1157   RDW 12.7 11/16/2021 1214   LYMPHSABS 1.0 02/21/2022 1157   MONOABS 0.7 02/21/2022 1157   EOSABS 0.0 02/21/2022 1157   BASOSABS 0.0 02/21/2022 1157    CMP     Component Value Date/Time   NA 144 03/01/2022 1136   NA 140 11/16/2021 1214   K 3.4 (L) 03/01/2022 1136   CL 104 03/01/2022 1136   CO2 29 03/01/2022 1136   GLUCOSE 101 (H) 03/01/2022 1136   BUN 9 03/01/2022 1136   BUN 36 (H) 11/16/2021 1214   CREATININE 0.91 03/01/2022 1136   CALCIUM 9.7 03/01/2022 1136   PROT 8.3 (H) 02/21/2022 1157   PROT 8.0 11/16/2021 1214   ALBUMIN 4.3 02/21/2022 1157   ALBUMIN 4.6 11/16/2021 1214   AST 28 02/21/2022 1157   ALT 14 02/21/2022 1157   ALKPHOS 43 02/21/2022 1157   BILITOT 1.3 (H) 02/21/2022 1157   BILITOT 0.7 11/16/2021 1214   GFRNONAA >60  02/21/2022 2228   GFRAA 62 12/17/2019 1008    Lipid Panel     Component Value Date/Time   CHOL 149 11/16/2021 1214   TRIG 89 11/16/2021 1214   HDL 36 (L) 11/16/2021 1214   CHOLHDL 4.1 11/16/2021 1214   LDLCALC 96 11/16/2021 1214     Imaging I have reviewed the images obtained:  CT-scan of the brain: No acute abnormality  CTA head and neck:No LVO  CTP: No core or penumbra  MRI examination of the brain: Pending  Assessment: 88 year old  patient with history of hypertension, hyperlipidemia and diabetes presents after being found confused and aphasic at home after her home alarm system went off.  Last known well time was at 2100 last night when she spoke with her daughter.  On exam, patient has left gaze deviation, right hemianopia as well as aphasia and inability to follow some commands.  She appears to be oriented to self only.  CT head reveals no acute abnormalities, CTA and CTP show no LVO and no infarct.  MRI is pending.  Will obtain LTM EEG to investigate for seizure activity.  Will load with Keppra 20 mg/kg and continue 500 mg Keppra twice daily  Impression: Possible seizure activity with prolonged postictal confusion versus stroke  Recommendations: -MRI brain -Overnight EEG with MRI safe leads. -Load with Keppra 20 mg/kg  Pt seen by NP/Neuro and later by MD. Note/plan to be edited by MD as needed.  [place] , MSN, AGACNP-BC [facility] See Amion for schedule and pager information 03/05/2022 3:28 PM   NEUROHOSPITALIST ADDENDUM Performed a face to face diagnostic evaluation.   I have reviewed the contents of history and physical exam as documented by PA/ARNP/Resident and agree with above documentation.  I have discussed and formulated the above plan as documented. Edits to the note have been made as needed.  Impression/Key exam findings/Plan: 56F with history of hypertension, hyperlipidemia, diabetes who lives by herself and was found aphasic  and confused at home after her home alarm went off.  In addition to aphasia noted when conversing with her, we also noted that she has a left gaze preference and will not cross the midline.  She also has right hemianopsia and does not blink to threat on the right.  Work-up so far with CT head with no acute abnormalities and aspects of 10.  CT A&P with no LVO and no mismatch.  She was outside the window for [facility] and is not a candidate for thrombectomy given no LVO.  Etiology of her focal deficit is not entirely clear at this time.  It may have been that she had a thrombus in the left PCA that broke down by the time she got to the ED but I would expect to see at least some loss of gray-white differentiation on the CT head given her last known well is about 16 hours from presentation.  Another possibility is that she could have had a focal seizure and may still be in focal status or this could very well be a postictal phenomena.  Would typically expect her to be more somnolent however if this truly was a seizure but I see in several instances where focal seizure may not cause a lot of somnolence.  Our plan is to get an MRI of her brain without contrast and also a continuous EEG.  We will load her up with 1 g of Keppra.  Maintenance AEDs based on continuous EEG findings.  Donnetta Simpers, MD [facility] [phone]   If 7pm to 7am, please call on call as listed on AMION.

## 2022-03-05 NOTE — ED Notes (Signed)
The pt  does not follow commands   only tells me her name  her daughter remains at  the bedside

## 2022-03-05 NOTE — ED Notes (Signed)
The pts speech is garbled she follows some commands but not others she could tell me her name but nothing else very difficult for me to evaluate her  she attempts to follow commands as she understands it

## 2022-03-05 NOTE — ED Notes (Signed)
Eeg is here

## 2022-03-05 NOTE — ED Provider Notes (Signed)
[facility] EMERGENCY DEPARTMENT Provider Note   CSN: [id number] Arrival date & time: 03/05/22  1522     History  Chief Complaint  Patient presents with   Code Stroke    Rebekah Avila is a 88 y.o. female.  88 year old female with a history of HTN, HLD, DM who presents emergency department with difficulty speaking.  Patient was last known well last night at 9 PM when family doctor on the phone.  Today her burglary alarm went off and went police arrived she was walking around naked and appeared to be confused.  On EMS arrival they noticed that she had right-sided neglect and code stroke was called.  They do not believe that she has on blood thinners.    Past Medical History:  Diagnosis Date   Diabetes ([place])    High cholesterol    Hypertension    Polymyalgia rheumatica ([place])    Renal disorder       Home Medications Prior to Admission medications   Medication Sig Start Date End Date Taking? Authorizing Provider  ACCU-CHEK FASTCLIX LANCETS MISC 1 Device by Does not apply route daily at 12 noon.    [provider]  acetaminophen (TYLENOL) 500 MG tablet Take 1,000 mg by mouth as needed.    [provider]  aspirin EC 81 MG tablet Take 81 mg by mouth every evening.    [provider]  Blood Glucose Monitoring Suppl (ACCU-CHEK NANO SMARTVIEW) w/Device KIT 1 strip by Does not apply route.    [provider]  Coenzyme Q10 (COQ10) 100 MG CAPS Take 1 capsule by mouth daily at 12 noon.    [provider]  fluticasone (FLONASE) 50 MCG/ACT nasal spray Place 1 spray into both nostrils daily as needed for allergies or rhinitis.    [provider]  Ginkgo Biloba (GINKOBA PO) Take 120 mg by mouth daily at 12 noon.    [provider]  losartan (COZAAR) 25 MG tablet Take 1 tablet (25 mg total) by mouth daily. 03/01/22   Ngetich, Dinah C, NP  metoprolol tartrate (LOPRESSOR) 50 MG tablet Take 1 tablet (50 mg total) by  mouth 2 (two) times daily. 12/24/21   Lauree Chandler, NP  Omega-3 Fatty Acids (FISH OIL) 1000 MG CAPS Take 1,000 mg by mouth daily with breakfast.    [provider]  potassium chloride SA (KLOR-CON M) 20 MEQ tablet Take 1 tablet (20 mEq total) by mouth 2 (two) times daily. Take 2 tablets daily for 3 days 03/04/22   Ngetich, Dinah C, NP  pravastatin (PRAVACHOL) 80 MG tablet One tab po M-F, skip weekends 12/24/21   Lauree Chandler, NP      Allergies    Morphine and related    Review of Systems   Review of Systems  Physical Exam Updated Vital Signs BP (!) 152/102   Pulse 89   Temp 98.7 F (37.1 C) (Oral)   Resp (!) 22   Ht 5' (1.524 m)   Wt 50.8 kg   SpO2 100%   BMI 21.87 kg/m  Physical Exam Vitals and nursing note reviewed.  Constitutional:      General: She is not in acute distress.    Appearance: She is well-developed.     Comments: Alert and conversant  HENT:     Head: Normocephalic and atraumatic.     Right Ear: External ear normal.     Left Ear: External ear normal.     Nose:  Nose normal.  Eyes:     Extraocular Movements: Extraocular movements intact.     Conjunctiva/sclera: Conjunctivae normal.     Pupils: Pupils are equal, round, and reactive to light.  Cardiovascular:     Rate and Rhythm: Normal rate and regular rhythm.     Heart sounds: No murmur heard. Pulmonary:     Effort: Pulmonary effort is normal. No respiratory distress.     Breath sounds: Normal breath sounds.  Abdominal:     General: Abdomen is flat.  Musculoskeletal:        General: No swelling.     Cervical back: Normal range of motion and neck supple.     Right lower leg: Edema present.     Left lower leg: Edema present.  Skin:    General: Skin is warm and dry.     Capillary Refill: Capillary refill takes less than 2 seconds.  Neurological:     Mental Status: She is alert.     Comments: MENTAL STATUS: AAOx3 CRANIAL NERVES: II: Pupils equal and reactive 4 mm BL, no RAPD,  right-sided visual neglect III, IV, VI: L gaze preference, no nystagmus. V: normal sensation to light touch in V1, V2, and V3 segments bilaterally VII: no facial weakness or asymmetry, no nasolabial fold flattening VIII: normal hearing to speech and finger friction IX, X: normal palatal elevation, no uvular deviation XI: 5/5 head turn and 5/5 shoulder shrug bilaterally XII: midline tongue protrusion MOTOR: 5/5 strength in R shoulder flexion, elbow flexion and extension, and grip strength. 5/5 strength in L shoulder flexion, elbow flexion and extension, and grip strength.  5/5 strength in R hip and knee flexion, knee extension, ankle plantar and dorsiflexion. 5/5 strength in L hip and knee flexion, knee extension, ankle plantar and dorsiflexion. SENSORY: Right-sided sensory neglect   Psychiatric:        Mood and Affect: Mood normal.     ED Results / Procedures / Treatments   Labs (all labs ordered are listed, but only abnormal results are displayed) Labs Reviewed  CBC - Abnormal; Notable for the following components:      Result Value   RBC 2.91 (*)    Hemoglobin 9.1 (*)    HCT 26.8 (*)    All other components within normal limits  I-STAT CHEM 8, ED - Abnormal; Notable for the following components:   Potassium 3.0 (*)    BUN 6 (*)    Glucose, Bld 101 (*)    Calcium, Ion 0.91 (*)    Hemoglobin 8.8 (*)    HCT 26.0 (*)    All other components within normal limits  CBG MONITORING, ED - Abnormal; Notable for the following components:   Glucose-Capillary 110 (*)    All other components within normal limits  PROTIME-INR  APTT  DIFFERENTIAL  COMPREHENSIVE METABOLIC PANEL  ETHANOL  MAGNESIUM    EKG EKG Interpretation  Date/Time:  Saturday March 05 2022 16:24:04 EST Ventricular Rate:  88 PR Interval:  203 QRS Duration: 94 QT Interval:  384 QTC Calculation: 465 R Axis:   8 Text Interpretation: Sinus rhythm Low voltage, precordial leads Artifact in lead(s) I II III aVR  aVL aVF V1 V2 V3 V4 Confirmed by Margaretmary Eddy [phone]) on 03/05/2022 4:36:10 PM  Radiology CT ANGIO HEAD NECK W WO CM W PERF (CODE STROKE)  Result Date: 03/05/2022 CLINICAL DATA:  Neuro deficit, acute, stroke suspected EXAM: CT ANGIOGRAPHY HEAD AND NECK CT PERFUSION BRAIN TECHNIQUE: Multidetector CT imaging of the head  and neck was performed using the standard protocol during bolus administration of intravenous contrast. Multiplanar CT image reconstructions and MIPs were obtained to evaluate the vascular anatomy. Carotid stenosis measurements (when applicable) are obtained utilizing NASCET criteria, using the distal internal carotid diameter as the denominator. Multiphase CT imaging of the brain was performed following IV bolus contrast injection. Subsequent parametric perfusion maps were calculated using RAPID software. RADIATION DOSE REDUCTION: This exam was performed according to the departmental dose-optimization program which includes automated exposure control, adjustment of the mA and/or kV according to patient size and/or use of iterative reconstruction technique. COMPARISON:  None Available. FINDINGS: Aortic arch: Great vessel origins are patent without significant stenosis. Right carotid system: Atherosclerosis at the carotid bifurcation without greater than 50% stenosis. Left carotid system: Atherosclerosis at the carotid bifurcation without greater than 50% stenosis Vertebral arteries: Codominant. No evidence of dissection, stenosis (50% or greater), or occlusion. Skeleton: Severe multilevel degenerative change. Other neck: No acute findings. Upper chest: Visualized lung apices are clear. Review of the MIP images confirms the above findings CTA HEAD FINDINGS Anterior circulation: Bilateral intracranial ICAs, MCAs, and ACAs are patent without proximal hemodynamically significant stenosis. Small (1-2 mm) outpouching arising from the inter communicating artery, suspicious for aneurysm. Posterior  circulation: Bilateral intradural vertebral arteries, basilar artery, and bilateral posterior cerebral arteries are patent without proximal hemodynamically significant stenosis. Venous sinuses: As permitted by contrast timing, patent. CT Brain Perfusion Findings: ASPECTS: 10. CBF (<30%) Volume: 842m Perfusion (Tmax>6.0s) volume: 0342mMismatch Volume: 42m44mnfarction Location:None identified. IMPRESSION: 1. No emergent large vessel occlusion or proximal high-grade stenosis. 2. No evidence of core infarct or penumbra on CT perfusion. 3. Small (1-2 mm) outpouching arising from the inter communicating artery, suspicious for aneurysm. Urgent findings discussed with Dr. KhaLorrin Goodella telephone at 3:50 p.m. Electronically Signed   By: FreMargaretha SheffieldD.   On: 03/05/2022 15:59   CT HEAD CODE STROKE WO CONTRAST  Result Date: 03/05/2022 CLINICAL DATA:  Code stroke.  Neuro deficit, acute, stroke suspected EXAM: CT HEAD WITHOUT CONTRAST TECHNIQUE: Contiguous axial images were obtained from the base of the skull through the vertex without intravenous contrast. RADIATION DOSE REDUCTION: This exam was performed according to the departmental dose-optimization program which includes automated exposure control, adjustment of the mA and/or kV according to patient size and/or use of iterative reconstruction technique. COMPARISON:  CT head October 30, 23. FINDINGS: Brain: No evidence of acute large vascular territory infarction, hemorrhage, hydrocephalus, extra-axial collection or mass lesion/mass effect. Similar patchy white matter hypodensities, nonspecific but compatible with chronic microvascular ischemic disease. Vascular: No hyperdense vessel identified. Skull: No acute fracture. Sinuses/Orbits: Left sphenoid sinus opacification with surrounding osteitis. Other: No mastoid effusions. ASPECTS ([facility] Early CT Score) total score (0-10 with 10 being normal): 10. IMPRESSION: 1. No evidence of acute intracranial  abnormality.  ASPECTS is 10. 2. Chronic microvascular ischemic disease. Code stroke imaging results were communicated on 03/05/2022 at 3:39 pm to provider KhaNorthern [facility] LLCa secure text paging. Electronically Signed   By: FreMargaretha SheffieldD.   On: 03/05/2022 15:39    Procedures Procedures   Ultrasound Guided Peripheral IV Indication: difficult to access - nursing staff unable to secure adequate peripheral IV access Location: L Antecubital Catheter Size: 20 G  Static views used to identify the target vein then usual prep with Chloraprep. IV placed on first attempt under dynamic [place]. Dark red flash noted, and catheter advanced smoothly into the vein. NS saline flushed without resistance, witnessed swelling,  or patient discomfort. IV secured with I-site and tegaderm. The patient tolerated the procedure well. Performed By: Margaretmary Eddy MD   Medications Ordered in ED Medications  sodium chloride flush (NS) 0.9 % injection 3 mL (has no administration in time range)  aspirin chewable tablet 324 mg (has no administration in time range)  levETIRAcetam (KEPPRA) IVPB 500 mg/100 mL premix (has no administration in time range)  potassium chloride SA (KLOR-CON M) CR tablet 40 mEq (has no administration in time range)  iohexol (OMNIPAQUE) 350 MG/ML injection 100 mL (100 mLs Intravenous Contrast Given 03/05/22 1549)  levETIRAcetam (KEPPRA) IVPB 1000 mg/100 mL premix (0 mg Intravenous Stopped 03/05/22 1635)    ED Course/ Medical Decision Making/ A&P Clinical Course as of 03/05/22 1653  Sat Mar 05, 2022  1600 Dr Lorrin Goodell from neurology feels that patient could have had TIA or focal seizure.  Loading with Keppra and recommends admission for continuous EEG and MRI. [RP]  1632 Hemoglobin(!): 9.1 Baseline of 11 [RP]  1649 Dr Tamala Julian from hospitalist accepts the patient. [RP]    Clinical Course User Index [RP] Fransico Meadow, MD                           Medical Decision Making Amount  and/or Complexity of Data Reviewed Labs: ordered. Decision-making details documented in ED Course. Radiology: ordered.  Risk OTC drugs. Prescription drug management. Decision regarding hospitalization.   ESME FREUND is a 88 y.o. female with comorbidities that complicate the patient evaluation including diabetes, hypertension, hyperlipidemia who presents with chief complaint of slurred speech and neglect.  This patient presents to the ED for concern of complaints listed in HPI, this involves an extensive number of treatment options, and is a complaint that carries with it a high risk of complications and morbidity. Disposition including potential need for admission considered.   Initial Ddx:  Stroke, hypoglycemia, ICH  MDM:  Concern the patient may have LVO and still inside the window for treatment with a last known well 9 PM.  Code stroke was activated.  Will initiate stroke work-up.  Plan:  Labs Fingerstick glucose CT head without contrast CTA head and neck with perfusion Neurology consult  ED Summary/Re-evaluation:  Patient reassessed and remained stable.  CTA and perfusion did not show any acute abnormalities.  Neurology felt that she may have had a small stroke, TIA, or focal seizure with residual deficits.  She has been loaded with Keppra and will be admitted to medicine for continuous EEG as well as MRI.  Ordered the patient for aspirin.  Dispo: Admit to Floor   Additional history obtained from EMS Records reviewed DC Summary The following labs were independently interpreted: Chemistry and show  hypokalemia I independently reviewed the following imaging with scope of interpretation limited to determining acute life threatening conditions related to emergency care: CT Head, which revealed no acute abnormality  I personally reviewed and interpreted cardiac monitoring: normal sinus rhythm  I personally reviewed and interpreted the pt's EKG: see above for interpretation  I  have reviewed the patients home medications and made adjustments as needed Consults: Neurology  Final Clinical Impression(s) / ED Diagnoses Final diagnoses:  Neglect of one side of body    Rx / DC Orders ED Discharge Orders     None      CRITICAL CARE Performed by: Fransico Meadow   Total critical care time: 45 minutes  Critical care time was exclusive of separately  billable procedures and treating other patients.  Critical care was necessary to treat or prevent imminent or life-threatening deterioration.  Critical care was time spent personally by me on the following activities: development of treatment plan with patient and/or surrogate as well as nursing, discussions with consultants, evaluation of patient's response to treatment, examination of patient, obtaining history from patient or surrogate, ordering and performing treatments and interventions, ordering and review of laboratory studies, ordering and review of radiographic studies, pulse oximetry and re-evaluation of patient's condition.    Fransico Meadow, MD 03/05/22 [phone]

## 2022-03-05 NOTE — ED Notes (Signed)
Neighbors at the bedside report that her speech is very abnormal  but she recognizes her neighbors  her daughter is on the way here from [employer]

## 2022-03-05 NOTE — Progress Notes (Signed)
LTM EEG hooked up and running - no initial skin breakdown - push button tested - [facility] NOT monitoring. Pt is in ED. MRI compatible leads placed.

## 2022-03-06 DIAGNOSIS — R4182 Altered mental status, unspecified: Secondary | ICD-10-CM

## 2022-03-06 DIAGNOSIS — R569 Unspecified convulsions: Secondary | ICD-10-CM

## 2022-03-06 DIAGNOSIS — G9340 Encephalopathy, unspecified: Secondary | ICD-10-CM

## 2022-03-06 DIAGNOSIS — E119 Type 2 diabetes mellitus without complications: Secondary | ICD-10-CM | POA: Diagnosis not present

## 2022-03-06 LAB — BASIC METABOLIC PANEL
Anion gap: 13 (ref 5–15)
BUN: 5 mg/dL — ABNORMAL LOW (ref 8–23)
CO2: 22 mmol/L (ref 22–32)
Calcium: 9.1 mg/dL (ref 8.9–10.3)
Chloride: 103 mmol/L (ref 98–111)
Creatinine, Ser: 0.92 mg/dL (ref 0.44–1.00)
GFR, Estimated: >60 mL/min (ref >60)
Glucose, Bld: 103 mg/dL — ABNORMAL HIGH (ref 70–99)
Potassium: 3.3 mmol/L — ABNORMAL LOW (ref 3.5–5.1)
Sodium: 138 mmol/L (ref 135–145)

## 2022-03-06 LAB — CBG MONITORING, ED: Glucose-Capillary: 112 mg/dL — ABNORMAL HIGH (ref 70–99)

## 2022-03-06 LAB — VITAMIN B12: Vitamin B-12: 386 pg/mL (ref 180–914)

## 2022-03-06 LAB — MAGNESIUM: Magnesium: 1.4 mg/dL — ABNORMAL LOW (ref 1.7–2.4)

## 2022-03-06 LAB — PHOSPHORUS: Phosphorus: 2.9 mg/dL (ref 2.5–4.6)

## 2022-03-06 MED ORDER — MAGNESIUM SULFATE 2 GM/50ML IV SOLN
2.0000 g | Freq: Once | INTRAVENOUS | Status: AC
  Administered 2022-03-06: 2 g via INTRAVENOUS
  Filled 2022-03-06: qty 50

## 2022-03-06 MED ORDER — LACTATED RINGERS IV SOLN: INTRAVENOUS | Status: DC

## 2022-03-06 MED ORDER — POTASSIUM CHLORIDE 10 MEQ/100ML IV SOLN
10.0000 meq | INTRAVENOUS | Status: AC
  Administered 2022-03-06 (×3): 10 meq via INTRAVENOUS
  Filled 2022-03-06 (×3): qty 100

## 2022-03-06 MED ORDER — POTASSIUM CHLORIDE 10 MEQ/100ML IV SOLN: 10.0000 meq | INTRAVENOUS | Status: DC

## 2022-03-06 NOTE — ED Notes (Signed)
Dr. Irene Limbo contacted for urinalysis order. No new orders received at this time.

## 2022-03-06 NOTE — Progress Notes (Signed)
SLP Cancellation Note  Patient Details Name: Rebekah Avila MRN: [id number] DOB: 03/23/1932   Cancelled treatment:       Reason Eval/Treat Not Completed: Fatigue/lethargy limiting ability to participate  Ferdinand Lango MA, CCC-SLP  Rebekah Avila 03/06/2022, 10:41 AM

## 2022-03-06 NOTE — Progress Notes (Signed)
  Progress Note   Patient: Rebekah Avila VEL:[id number] DOB: 11-14-1931 DOA: 03/05/2022     1 DOS: the patient was seen and examined on 03/06/2022   Brief hospital course: 88 year old woman PMH including diabetes hypertension hyperlipidemia who lives alone and performs all ADLs independently, found confused and aphasic by EMS, left gaze deviation and right hemianopsia, word finding difficulties and confusion.  CT head negative, CTA no LVO, no perfusion deficit.  Seen by neurology, MRI negative.  Obtaining LTM EEG and loaded with Keppra.  Assessment and Plan: Acute encephalopathy secondary to suspected seizure, focal left hemispheric deficit -- Imaging of the head and neck was unrevealing.  No evidence of stroke.  Concern for seizure.  Loaded on Keppra. -- EEG suggestive of cortical dysfunction arising from left hemisphere likely secondary to underlying structural abnormality, post-ictal state. No seizures or epileptiform discharges were seen throughout the recording.  --LP attempted by ED, unsuccessful secondary to patient movement.  Per neurology obtain LP under fluoroscopy 11/13 and follow-up CSF studies.  No signs or symptoms to suggest CNS infection.   Hypokalemia, hypomagnesemia -- Replete.  Phosphorus within normal limits.   Essential hypertension --Blood pressure elevated.  No evidence of stroke.  Start antihypertensives.   Controlled diabetes mellitus type 2 --Patient was not on any medications for treatment.  Glucose 101 on admission.  Last hemoglobin A1c 6.5 on 11/16/2021. --Supportive care   Hyperlipidemia --on pravastatin 80 mg daily Monday through Friday skipping weekends.  Small (1-2 mm) outpouching arising from the inter communicating artery, suspicious for aneurysm. -- Follow-up as an outpatient      Subjective:  Sleeping   Per daughter at bedside the patient's independent, lives alone, has mild memory problems but is able to perform own ADLs.  Daughter checks on  her on the weekends.  Patient seems to have been having some trouble with self administration medications lately.  Physical Exam: Vitals:   03/06/22 1555 03/06/22 1700 03/06/22 1715 03/06/22 1814  BP: (!) 191/85 (!) 184/88  (!) 192/88  Pulse: 78 (!) 107 84 98  Resp: 13 (!) 27 20 18   Temp:    99 F (37.2 C)  TempSrc:    Oral  SpO2: 98% 100% 99% 100%  Weight:      Height:       Physical Exam Vitals reviewed.  Constitutional:      General: She is not in acute distress.    Appearance: She is not ill-appearing or toxic-appearing.  Cardiovascular:     Rate and Rhythm: Normal rate and regular rhythm.     Heart sounds: No murmur heard. Pulmonary:     Effort: Pulmonary effort is normal. No respiratory distress.     Breath sounds: No wheezing, rhonchi or rales.  Neurological:     Mental Status: She is alert.  Psychiatric:     Comments: Sleeping, on LTM EEG.     Data Reviewed: Potassium 3.3 Magnesium 1.4 Phosphorus 2.9  Family Communication: daughter at bedside  Disposition: Status is: Inpatient Remains inpatient appropriate because: acute encephalopathy, seizure suspected  Planned Discharge Destination:  TBD    Time spent: 35 minutes  Author: , MD 03/06/2022 6:35 PM  For on call review [email or web address].

## 2022-03-06 NOTE — Hospital Course (Signed)
88 year old woman PMH including diabetes hypertension hyperlipidemia who lives alone and performs all ADLs independently, found confused and aphasic by EMS, left gaze deviation and right hemianopsia, word finding difficulties and confusion.  CT head negative, CTA no LVO, no perfusion deficit.  Seen by neurology, MRI negative.  Obtaining LTM EEG and loaded with Keppra.

## 2022-03-06 NOTE — Progress Notes (Signed)
LTM EEG discontinued - no skin breakdown at unhook.   

## 2022-03-06 NOTE — ED Provider Notes (Signed)
1:37 PM Patient has been admitted by medicine however neurology just came to ask for some assistance as she is still physically in the emergency department waiting for her bed upstairs.  They feel she does need a lumbar puncture to try to determine etiology of her presenting symptoms.  They were going to speak with the patient's family to get consent for it and will place the appropriate orders that they want [place] to try to collect from a CSF standpoint.  We will attempt lumbar puncture however if we are unsuccessful they will order IR guided LP.  I called and spoke to Florene Glen personally and was able to get consent for lumbar puncture.  While getting set up and prep for the lumbar puncture, I used ultrasound to try and determine best landmarks and location to do the LP however patient began moving significantly and trying to grab it anything touching her back.  She is encephalopathic and is not following instructions to sit still.  Even with nursing holding her, she kept moving and I did not feel it was safe to attempt LP in the ED at this time.  I spoke to neurology who will order IR guided LP to maximize chances of getting the sample needed to help determine etiology of the patient's presenting symptoms.  Neurology will order IR guided LP and she will continue her management during admission.       Namita Yearwood, Canary Brim, MD 03/06/22 [phone]

## 2022-03-06 NOTE — Procedures (Signed)
Patient Name: DONICE ALPERIN  MRN: [id number]  Epilepsy Attending: Charlsie Quest  Referring Physician/Provider: Marjorie Smolder, NP  Duration: 03/05/2022 1652 to 03/06/2022 1356  Patient history: 89yo F with ams. EEG to evaluate for seizure  Level of alertness: Awake, asleep  AEDs during EEG study: None  Technical aspects: This EEG study was done with scalp electrodes positioned according to the 10-20 International system of electrode placement. Electrical activity was reviewed with band pass filter of 1-70Hz , sensitivity of 7 uV/mm, display speed of 3mm/sec with a 60Hz  notched filter applied as appropriate. EEG data were recorded continuously and digitally stored.  Video monitoring was available and reviewed as appropriate.  Description: The posterior dominant rhythm consists of 8Hz  activity of moderate voltage (25-35 uV) seen predominantly in posterior head regions, asymmetric ( left<right) and reactive to eye opening and eye closing. Sleep was characterized by vertex waves, sleep spindles (12 to 14 Hz), maximal frontocentral region. EEG showed continuous low amplitude 2-3hz  delta slowing in left hemisphere. Hyperventilation and photic stimulation were not performed.     ABNORMALITY - Continuous slow, left hemisphere - Background asymmetry, left<right  IMPRESSION: This study is suggestive of cortical dysfunction arising from left hemisphere likely secondary to underlying structural abnormality, post-ictal state. No seizures or epileptiform discharges were seen throughout the recording.  Allean Montfort 

## 2022-03-06 NOTE — Progress Notes (Signed)
Patient arrived from ED via stretcher; daughter present at bedside; oriented daughter to room and unit routine; patient alert but nonverbal; nods yes in response to questions; fall safety reviewed; alarms set; bed low and locked.

## 2022-03-06 NOTE — Progress Notes (Signed)
Neurology Progress Note  Brief HPI: 88 year old patient with history of hypertension, hyperlipidemia and diabetes was admitted yesterday with confusion, aphasia, right hemianopsia and left gaze deviation.  At baseline, she is normally alert and oriented with some memory loss.  Per her daughter she was at baseline normal mental status Friday evening.  CT head showed no acute abnormality, CTA with perfusion was performed and revealed no LVO and no perfusion deficit.  MRI shows no acute abnormality.  Although patient does not have a history of seizures, there is a question of seizure activity with prolonged postictal confusion.  LTM EEG shows left sided slowing but no seizure activity.  Subjective: Per daughter, patient has been up and restless all night.  She is still confused but was able to recognize her daughter.  Exam: Vitals:   03/06/22 0600 03/06/22 0743  BP: (!) 175/82   Pulse: 93   Resp: 18   Temp:  98 F (36.7 C)  SpO2: 100%    Gen: In bed, NAD Resp: non-labored breathing, no acute distress  Neuro: Mental Status: Drowsy, does not respond to name but responds to light touch.  Unable to follow simple commands today. Cranial Nerves: Pupils equal and reactive with left gaze deviation but able to cross midline today.  Face symmetrical, voice appears normal Motor: Moves all extremities spontaneously and purposefully with equal strength on both sides Sensory: Appears intact to light touch throughout DTR: Unable to elicit Gait: Deferred  Pertinent Labs:    Latest Ref Rng & Units 03/05/2022    4:08 PM 03/05/2022    3:25 PM 02/21/2022   11:57 AM  CBC  WBC 4.0 - 10.5 K/uL  6.6  7.5   Hemoglobin 12.0 - 15.0 g/dL 8.8  9.1  42.6   Hematocrit 36.0 - 46.0 % 26.0  26.8  33.6   Platelets 150 - 400 K/uL  204  225        Latest Ref Rng & Units 03/05/2022    4:08 PM 03/05/2022    3:25 PM 03/01/2022   11:36 AM  BMP  Glucose 70 - 99 mg/dL 834  196  222   BUN 8 - 23 mg/dL 6  7  9     Creatinine 0.44 - 1.00 mg/dL  9.79  8.92   BUN/Creat Ratio 6 - 22 (calc)   SEE NOTE:   Sodium 135 - 145 mmol/L 141  141  144   Potassium 3.5 - 5.1 mmol/L 3.0  3.0  3.4   Chloride 98 - 111 mmol/L 100  103  104   CO2 22 - 32 mmol/L  26  29   Calcium 8.9 - 10.3 mg/dL  8.7  9.7     Imaging Reviewed:  CT head: No acute abnormality, chronic microvascular ischemic disease  CTA head with perfusion: No LVO and no evidence of core infarct or penumbra  MRI brain: No acute abnormality  Assessment: 88 year old patient with history of hypertension, hyperlipidemia and diabetes who is alert and oriented at baseline presents with acute onset confusion, aphasia and left gaze deviation.  Brain imaging is negative for stroke, question whether patient had a seizure and is having prolonged postictal confusion.  Awaiting read on LTM EEG.  Patient was loaded with Keppra.  Will continue Keppra if seizure activity detected. Will send nutritional labs and search for additional causes of patient's altered mental status.  LTM EEG shows no seizure activity but left sided slowing.  Will obtain LP to investigate for autoimmune causes  of her altered mental status.  Impression: Possible seizure activity with prolonged postictal state vs autoimmune encephalitis.  Recommendations: 1) discontinue LTM EEG 2) LP to investigate for autoimmune encephalitis 3) send labs to investigate for confusion, B1, B12, TSH, RPR  Cortney E Ernestina Columbia , MSN, AGACNP-BC [facility] See Amion for schedule and pager information 03/06/2022 8:38 AM    NEUROHOSPITALIST ADDENDUM Performed a face to face diagnostic evaluation.   I have reviewed the contents of history and physical exam as documented by PA/ARNP/Resident and agree with above documentation.  I have discussed and formulated the above plan as documented. Edits to the note have been made as needed.  Etiology of her focal left hemispheric deficit is not clear.  She still has deficit on clinical exam. LTM with no seizures. MRI brain negative for stroke or any other abnormality.suspect that this is post ictal. Attempted LP at bedside by ED failed as patient kept moving. Will need to get LP under fluoro on Monday. CSF studies ordered, enpcehalopathy labs pending. No meningismus, no fever, no leukocytosis concerning for CNS infection.    Erick Blinks, MD [facility] [phone]   If 7pm to 7am, please call on call as listed on AMION.

## 2022-03-06 NOTE — ED Notes (Signed)
This pt cannot pass a swallow screen because she cannot follow instructions not much improvement through the night

## 2022-03-06 NOTE — Evaluation (Signed)
Physical Therapy Evaluation Patient Details Name: Rebekah Avila MRN: [id number] DOB: [birth year] Today's Date: 03/06/2022  History of Present Illness  Rebekah Avila is a 88 y.o. female who presented as code stroke with confusion, L gaze, and difficulty speaking. Stroke workup was negative. Pt with history of hypertension, hyperlipidemia and diabetes  Clinical Impression   Pt admitted with above diagnosis. She presents with impaired cognition, communication and low Level of arousal therefore home set up and PLOF were not obtained. Due to deficits listed below she currently required total A +2 for all aspects of her care at bed and basic transfer level. Stood briefly with 2 person Max assist; Pt did have increase arousal once sitting EOB  Pt currently with functional limitations due to the deficits listed below (see PT Problem List). Pt will benefit from skilled PT to increase their independence and safety with mobility to allow discharge to the venue listed below.          Recommendations for follow up therapy are one component of a multi-disciplinary discharge planning process, led by the attending physician.  Recommendations may be updated based on patient status, additional functional criteria and insurance authorization.  Follow Up Recommendations Skilled nursing-short term rehab (<3 hours/day) Can patient physically be transported by private vehicle: No    Assistance Recommended at Discharge Frequent or constant Supervision/Assistance  Patient can return home with the following       Equipment Recommendations Rolling walker (2 wheels);BSC/3in1  Recommendations for Other Services       Functional Status Assessment Patient has had a recent decline in their functional status and demonstrates the ability to make significant improvements in function in a reasonable and predictable amount of time.     Precautions / Restrictions Precautions Precautions: Fall Precaution Comments:  continuous EEG Restrictions Weight Bearing Restrictions: No      Mobility  Bed Mobility Overal bed mobility: Needs Assistance Bed Mobility: Rolling, Sidelying to Sit, Sit to Supine Rolling: Total assist, +2 for physical assistance, +2 for safety/equipment Sidelying to sit: Total assist, +2 for physical assistance, +2 for safety/equipment   Sit to supine: Total assist, +2 for physical assistance, +2 for safety/equipment   General bed mobility comments: increased level of arousal once sitting    Transfers Overall transfer level: Needs assistance Equipment used: 2 person hand held assist Transfers: Sit to/from Stand Sit to Stand: Max assist, +2 physical assistance           General transfer comment: Max assist of 2 to rise with close guard/knee block for safety    Ambulation/Gait                  Stairs            Wheelchair Mobility    Modified Rankin (Stroke Patients Only)       Balance     Sitting balance-Leahy Scale: Poor Sitting balance - Comments: min-max A for sitting balance on high stretcher bed                                     Pertinent Vitals/Pain Pain Assessment Pain Assessment: Faces Faces Pain Scale: Hurts a little bit Pain Location: grimace noted with rolling Pain Descriptors / Indicators: Grimacing Pain Intervention(s): Limited activity within patient's tolerance    Home Living Family/patient expects to be discharged to:: Private residence  Additional Comments: Pt unable to report, no family present    Prior Function Prior Level of Function : Patient poor historian/Family not available                     Hand Dominance        Extremity/Trunk Assessment   Upper Extremity Assessment Upper Extremity Assessment: Defer to OT evaluation    Lower Extremity Assessment Lower Extremity Assessment: Generalized weakness    Cervical / Trunk Assessment Cervical / Trunk  Assessment: Kyphotic  Communication   Communication: Expressive difficulties (pt smiliing with minimal "yes" adn "okay" verbalizations made)  Cognition Arousal/Alertness: Lethargic Behavior During Therapy: Flat affect Overall Cognitive Status: Impaired/Different from baseline Area of Impairment: Attention, Following commands, Safety/judgement, Awareness, Problem solving                   Current Attention Level: Focused     Safety/Judgement: Decreased awareness of safety, Decreased awareness of deficits Awareness: Intellectual   General Comments: low level of arousal, did improve minimally with sitting EOB. Pt smiling, stating yes to al questions. did not follow any commands. eyes closed most of the time        General Comments General comments (skin integrity, edema, etc.): VSS on RA    Exercises     Assessment/Plan    PT Assessment Patient needs continued PT services  PT Problem List Decreased strength;Decreased activity tolerance;Decreased balance;Decreased mobility;Decreased coordination;Decreased cognition;Decreased knowledge of use of DME;Decreased safety awareness;Decreased knowledge of precautions       PT Treatment Interventions DME instruction;Gait training;Functional mobility training;Stair training;Therapeutic activities;Therapeutic exercise;Balance training;Neuromuscular re-education;Cognitive remediation;Patient/family education    PT Goals (Current goals can be found in the Care Plan section)  Acute Rehab PT Goals Patient Stated Goal: Did not state PT Goal Formulation: Patient unable to participate in goal setting Time For Goal Achievement: 03/20/22 Potential to Achieve Goals: Fair    Frequency Min 2X/week     Co-evaluation PT/OT/SLP Co-Evaluation/Treatment: Yes Reason for Co-Treatment: Complexity of the patient's impairments (multi-system involvement) PT goals addressed during session: Mobility/safety with mobility OT goals addressed during  session: ADL's and self-care       AM-PAC PT "6 Clicks" Mobility  Outcome Measure Help needed turning from your back to your side while in a flat bed without using bedrails?: Total Help needed moving from lying on your back to sitting on the side of a flat bed without using bedrails?: Total Help needed moving to and from a bed to a chair (including a wheelchair)?: Total Help needed standing up from a chair using your arms (e.g., wheelchair or bedside chair)?: Total Help needed to walk in hospital room?: Total Help needed climbing 3-5 steps with a railing? : Total 6 Click Score: 6    End of Session Equipment Utilized During Treatment: Gait belt Activity Tolerance:  (Limited by level of arousal) Patient left: in bed;with call bell/phone within reach Nurse Communication: Mobility status PT Visit Diagnosis: Other abnormalities of gait and mobility (R26.89);Other symptoms and signs involving the nervous system (R29.898)    Time: 8984-2103 PT Time Calculation (min) (ACUTE ONLY): 15 min   Charges:   PT Evaluation $PT Eval Moderate Complexity: 1 Mod          Van Clines, PT  Acute Rehabilitation Services Office [phone]   Levi Aland 03/06/2022, 3:58 PM

## 2022-03-06 NOTE — Evaluation (Signed)
Occupational Therapy Evaluation Patient Details Name: Rebekah Avila MRN: [id number] DOB: 1931-10-01 Today's Date: 03/06/2022   History of Present Illness Rebekah Avila is a 88 y.o. female who presented as code stroke with confusion, L gaze, and difficulty speaking. Stroke workup was negative. Pt with history of hypertension, hyperlipidemia and diabetes   Clinical Impression   Rebekah Avila was evaluated s/p the above admission list. She presents with impaired cognition, communication and low LOA therefore home set up and PLOF were not obtained. Due to deficits listed below she currently required total A +2 fro all aspects of her care at bed level. Pt did have increased in LOA once sitting EOB. She did not follow commands or answer yes/no questions accurately. OT to continue to follow. Recommend SNF at d/c.      Recommendations for follow up therapy are one component of a multi-disciplinary discharge planning process, led by the attending physician.  Recommendations may be updated based on patient status, additional functional criteria and insurance authorization.   Follow Up Recommendations  Skilled nursing-short term rehab (<3 hours/day)    Assistance Recommended at Discharge Frequent or constant Supervision/Assistance  Patient can return home with the following Two people to help with walking and/or transfers;Two people to help with bathing/dressing/bathroom;A lot of help with walking and/or transfers;A lot of help with bathing/dressing/bathroom;Assistance with cooking/housework;Direct supervision/assist for medications management;Direct supervision/assist for financial management;Assist for transportation;Help with stairs or ramp for entrance    Functional Status Assessment  Patient has had a recent decline in their functional status and demonstrates the ability to make significant improvements in function in a reasonable and predictable amount of time.  Equipment Recommendations  Other  (comment) (defer)       Precautions / Restrictions Precautions Precautions: Fall Precaution Comments: continuous EEG Restrictions Weight Bearing Restrictions: No      Mobility Bed Mobility Overal bed mobility: Needs Assistance Bed Mobility: Rolling, Sidelying to Sit, Sit to Supine Rolling: Total assist, +2 for physical assistance, +2 for safety/equipment Sidelying to sit: Total assist, +2 for physical assistance, +2 for safety/equipment   Sit to supine: Total assist, +2 for physical assistance, +2 for safety/equipment   General bed mobility comments: increased LOA once sitting    Transfers                   General transfer comment: deferred for safety      Balance Overall balance assessment: Needs assistance   Sitting balance-Leahy Scale: Poor Sitting balance - Comments: min-max A for sitting balance on high stretcher bed             ADL either performed or assessed with clinical judgement   ADL Overall ADL's : Needs assistance/impaired         General ADL Comments: total A for all aspects at bed level     Vision Baseline Vision/History: 0 No visual deficits Vision Assessment?: Vision impaired- to be further tested in functional context Additional Comments: poor visual attention, eyes close a lot of the time            Pertinent Vitals/Pain Pain Assessment Pain Assessment: Faces Faces Pain Scale: Hurts a little bit Pain Location: grimace noted with rolling Pain Descriptors / Indicators: Grimacing Pain Intervention(s): Limited activity within patient's tolerance, Monitored during session     Hand Dominance     Extremity/Trunk Assessment Upper Extremity Assessment Upper Extremity Assessment: Generalized weakness;Difficult to assess due to impaired cognition   Lower Extremity Assessment Lower Extremity Assessment: Defer to  PT evaluation   Cervical / Trunk Assessment Cervical / Trunk Assessment: Kyphotic   Communication  Communication Communication: Expressive difficulties (pt smiliing with minimal "yes" adn "okay" verbalizations made)   Cognition Arousal/Alertness: Lethargic Behavior During Therapy: Flat affect Overall Cognitive Status: Impaired/Different from baseline Area of Impairment: Attention, Following commands, Safety/judgement, Awareness, Problem solving                   Current Attention Level: Focused     Safety/Judgement: Decreased awareness of safety, Decreased awareness of deficits Awareness: Intellectual   General Comments: low LOA, did improve minimally with sitting EOB. Pt smiling, stating yes to al questions. did not follow any commands. eyes closed most of the time     General Comments  VSS on RA            Home Living Family/patient expects to be discharged to:: Private residence               Additional Comments: Pt unable to report, no family present      Prior Functioning/Environment Prior Level of Function : Patient poor historian/Family not available                        OT Problem List: Decreased strength;Decreased range of motion;Decreased activity tolerance;Impaired balance (sitting and/or standing);Decreased cognition;Decreased safety awareness;Decreased knowledge of use of DME or AE;Decreased knowledge of precautions      OT Treatment/Interventions: Self-care/ADL training;Therapeutic exercise;DME and/or AE instruction;Therapeutic activities;Balance training;Patient/family education    OT Goals(Current goals can be found in the care plan section) Acute Rehab OT Goals Patient Stated Goal: unable to state OT Goal Formulation: Patient unable to participate in goal setting Time For Goal Achievement: 03/20/22 Potential to Achieve Goals: Good ADL Goals Pt Will Perform Grooming: with min assist;sitting Pt Will Perform Lower Body Dressing: with mod assist;sit to/from stand Pt Will Transfer to Toilet: with mod assist;stand pivot  transfer;bedside commode Additional ADL Goal #1: pt will indep follow 100% of simple 1 step commands during sesssion  OT Frequency: Min 2X/week    Co-evaluation PT/OT/SLP Co-Evaluation/Treatment: Yes Reason for Co-Treatment: Complexity of the patient's impairments (multi-system involvement);For patient/therapist safety;To address functional/ADL transfers   OT goals addressed during session: ADL's and self-care      AM-PAC OT "6 Clicks" Daily Activity     Outcome Measure Help from another person eating meals?: Total Help from another person taking care of personal grooming?: Total Help from another person toileting, which includes using toliet, bedpan, or urinal?: Total Help from another person bathing (including washing, rinsing, drying)?: Total Help from another person to put on and taking off regular upper body clothing?: Total Help from another person to put on and taking off regular lower body clothing?: Total 6 Click Score: 6   End of Session Nurse Communication: Mobility status  Activity Tolerance: Patient tolerated treatment well Patient left: in bed;with call bell/phone within reach  OT Visit Diagnosis: Unsteadiness on feet (R26.81);Other abnormalities of gait and mobility (R26.89);Muscle weakness (generalized) (M62.81)                Time: 5929-2446 OT Time Calculation (min): 15 min Charges:  OT General Charges $OT Visit: 1 Visit OT Evaluation $OT Eval Moderate Complexity: 1 Mod    Caliegh Middlekauff D Causey 03/06/2022, 2:33 PM

## 2022-03-06 NOTE — ED Notes (Signed)
The pts  eeg leads are off partially eeg tech coming  down to investigate

## 2022-03-06 NOTE — ED Notes (Signed)
The eeg tech came  down to  replace the electrodes  that were pulled off  she has wrapped this pts head  the pt still  wiggling around in the bed

## 2022-03-07 ENCOUNTER — Inpatient Hospital Stay (HOSPITAL_COMMUNITY): Payer: Medicare Other

## 2022-03-07 DIAGNOSIS — R414 Neurologic neglect syndrome: Secondary | ICD-10-CM

## 2022-03-07 DIAGNOSIS — G9340 Encephalopathy, unspecified: Secondary | ICD-10-CM | POA: Diagnosis not present

## 2022-03-07 DIAGNOSIS — E119 Type 2 diabetes mellitus without complications: Secondary | ICD-10-CM | POA: Diagnosis not present

## 2022-03-07 DIAGNOSIS — R569 Unspecified convulsions: Secondary | ICD-10-CM | POA: Diagnosis not present

## 2022-03-07 LAB — BASIC METABOLIC PANEL
Anion gap: 14 (ref 5–15)
BUN: 6 mg/dL — ABNORMAL LOW (ref 8–23)
CO2: 22 mmol/L (ref 22–32)
Calcium: 9.2 mg/dL (ref 8.9–10.3)
Chloride: 98 mmol/L (ref 98–111)
Creatinine, Ser: 1.01 mg/dL — ABNORMAL HIGH (ref 0.44–1.00)
GFR, Estimated: 54 mL/min — ABNORMAL LOW (ref >60)
Glucose, Bld: 96 mg/dL (ref 70–99)
Potassium: 3.5 mmol/L (ref 3.5–5.1)
Sodium: 134 mmol/L — ABNORMAL LOW (ref 135–145)

## 2022-03-07 LAB — CSF CELL COUNT WITH DIFFERENTIAL
Eosinophils, CSF: 0 % (ref 0–1)
Lymphs, CSF: 19 % — ABNORMAL LOW (ref 40–80)
Monocyte-Macrophage-Spinal Fluid: 4 % — ABNORMAL LOW (ref 15–45)
RBC Count, CSF: UNDETERMINED /mm3
Segmented Neutrophils-CSF: 77 % — ABNORMAL HIGH (ref 0–6)
Tube #: 3
WBC, CSF: UNDETERMINED /mm3 (ref 0–5)

## 2022-03-07 LAB — GLUCOSE, CAPILLARY: Glucose-Capillary: 89 mg/dL (ref 70–99)

## 2022-03-07 LAB — PROTEIN AND GLUCOSE, CSF
Glucose, CSF: 56 mg/dL (ref 40–70)
Total  Protein, CSF: 296 mg/dL — ABNORMAL HIGH (ref 15–45)

## 2022-03-07 LAB — MENINGITIS/ENCEPHALITIS PANEL (CSF)

## 2022-03-07 LAB — TSH: TSH: 1.467 u[IU]/mL (ref 0.350–4.500)

## 2022-03-07 LAB — PHOSPHORUS: Phosphorus: 3.1 mg/dL (ref 2.5–4.6)

## 2022-03-07 LAB — MAGNESIUM: Magnesium: 2 mg/dL (ref 1.7–2.4)

## 2022-03-07 LAB — RPR: RPR Ser Ql: NONREACTIVE

## 2022-03-07 MED ORDER — LORAZEPAM 2 MG/ML IJ SOLN: 0.5000 mg | Freq: Once | INTRAMUSCULAR | Status: DC

## 2022-03-07 MED ORDER — ENOXAPARIN SODIUM 30 MG/0.3ML IJ SOSY
30.0000 mg | PREFILLED_SYRINGE | INTRAMUSCULAR | Status: DC
  Administered 2022-03-07 – 2022-03-13 (×7): 30 mg via SUBCUTANEOUS
  Filled 2022-03-07 (×7): qty 0.3

## 2022-03-07 MED ORDER — LIDOCAINE HCL (PF) 1 % IJ SOLN
5.0000 mL | Freq: Once | INTRAMUSCULAR | Status: AC
  Administered 2022-03-07: 5 mL

## 2022-03-07 MED ORDER — LORAZEPAM 0.5 MG PO TABS: 0.5000 mg | ORAL_TABLET | Freq: Once | ORAL | Status: DC

## 2022-03-07 NOTE — Progress Notes (Signed)
LTM EEG hooked up and running - no initial skin breakdown - push button tested - [facility] monitoring.  

## 2022-03-07 NOTE — Progress Notes (Signed)
Neurology Progress Note  Brief HPI: 88 year old patient with history of hypertension, hyperlipidemia and diabetes was admitted yesterday with confusion, aphasia, right hemianopsia and left gaze deviation.  At baseline, she is normally alert and oriented with some memory loss.  Per her daughter she was at baseline normal mental status Friday evening.  CT head showed no acute abnormality, CTA with perfusion was performed and revealed no LVO and no perfusion deficit.  MRI shows no acute abnormality.  Although patient does not have a history of seizures, there is a question of seizure activity with prolonged postictal confusion.  LTM EEG shows left sided slowing but no seizure activity.   S:// Patient laying in bed asleep in NAD. No family at the bedside. She awakens to tactile stimulation. She is not speaking, answering questions. She does not follow commands   O:// Current vital signs: BP (!) 175/78 (BP Location: Right Arm) Comment: RN Notified  Pulse 79   Temp 99.1 F (37.3 C) (Axillary)   Resp 20   Ht 5' (1.524 m)   Wt 50.8 kg   SpO2 99%   BMI 21.87 kg/m  Vital signs in last 24 hours: Temp:  [97.7 F (36.5 C)-99.8 F (37.7 C)] 99.1 F (37.3 C) (11/13 0859) Pulse Rate:  [69-107] 79 (11/13 0859) Resp:  [13-27] 20 (11/13 0859) BP: (153-194)/(65-105) 175/78 (11/13 0859) SpO2:  [97 %-100 %] 99 % (11/13 0859)  GENERAL: frail elderly female in NAD HEENT: - Normocephalic and atraumatic, dry mm LUNGS - Clear to auscultation bilaterally with no wheezes CV - S1S2 RRR, no m/r/g, equal pulses bilaterally. ABDOMEN - Soft, nontender, nondistended with normoactive BS Ext: warm, well perfused, intact peripheral pulses, no edema  NEURO:  Mental Status: she awakens to tactile stimulation. She is not answering orientation questions. She is not following commands.  Language: speech unable to assess Cranial Nerves: PERRL, EOM- left gaze but can cross midline, visual fields no blink to threat on the  right, no facial asymmetry, facial sensation intact, hearing intact, tongue/uvula/soft palate midline, normal sternocleidomastoid and trapezius muscle strength. No evidence of tongue atrophy or fibrillations Motor: moves all 4 extremities equally and spontaneously  Tone: is normal and bulk is normal Sensation- Intact to light touch bilaterally Coordination: Unable to assess  Gait- deferred    Medications  Current Facility-Administered Medications:     stroke: early stages of recovery book, , Does not apply, Once, Katrinka Blazing, Rondell A, MD   acetaminophen (TYLENOL) tablet 650 mg, 650 mg, Oral, Q4H PRN **OR** acetaminophen (TYLENOL) 160 MG/5ML solution 650 mg, 650 mg, Per Tube, Q4H PRN **OR** acetaminophen (TYLENOL) suppository 650 mg, 650 mg, Rectal, Q4H PRN, Katrinka Blazing, Rondell A, MD   aspirin EC tablet 81 mg, 81 mg, Oral, QPM, Smith, Rondell A, MD   aspirin suppository 300 mg, 300 mg, Rectal, Once, Smith, Rondell A, MD   enoxaparin (LOVENOX) injection 40 mg, 40 mg, Subcutaneous, Q24H, Smith, Rondell A, MD, 40 mg at 03/06/22 2003   hydrALAZINE (APRESOLINE) injection 10 mg, 10 mg, Intravenous, Q4H PRN, Clydie Braun, MD   lactated ringers infusion, , Intravenous, Continuous, Standley Brooking, MD, Last Rate: 125 mL/hr at 03/07/22 0856, New Bag at 03/07/22 0856   sodium chloride flush (NS) 0.9 % injection 3 mL, 3 mL, Intravenous, Once, Rondel Baton, MD Labs CBC    Component Value Date/Time   WBC 6.6 03/05/2022 1525   RBC 2.91 (L) 03/05/2022 1525   HGB 8.8 (L) 03/05/2022 1608   HGB 11.0 (L)  11/16/2021 1214   HCT 26.0 (L) 03/05/2022 1608   HCT 32.4 (L) 11/16/2021 1214   PLT 204 03/05/2022 1525   PLT 228 11/16/2021 1214   MCV 92.1 03/05/2022 1525   MCV 88 11/16/2021 1214   MCH 31.3 03/05/2022 1525   MCHC 34.0 03/05/2022 1525   RDW 13.0 03/05/2022 1525   RDW 12.7 11/16/2021 1214   LYMPHSABS 0.8 03/05/2022 1525   MONOABS 0.6 03/05/2022 1525   EOSABS 0.0 03/05/2022 1525   BASOSABS  0.0 03/05/2022 1525    CMP     Component Value Date/Time   NA 134 (L) 03/07/2022 0309   NA 140 11/16/2021 1214   K 3.5 03/07/2022 0309   CL 98 03/07/2022 0309   CO2 22 03/07/2022 0309   GLUCOSE 96 03/07/2022 0309   BUN 6 (L) 03/07/2022 0309   BUN 36 (H) 11/16/2021 1214   CREATININE 1.01 (H) 03/07/2022 0309   CREATININE 0.91 03/01/2022 1136   CALCIUM 9.2 03/07/2022 0309   PROT 6.5 03/05/2022 1525   PROT 8.0 11/16/2021 1214   ALBUMIN 3.4 (L) 03/05/2022 1525   ALBUMIN 4.6 11/16/2021 1214   AST 23 03/05/2022 1525   ALT 11 03/05/2022 1525   ALKPHOS 32 (L) 03/05/2022 1525   BILITOT 0.8 03/05/2022 1525   BILITOT 0.7 11/16/2021 1214   GFRNONAA 54 (L) 03/07/2022 0309   GFRAA 62 12/17/2019 1008    glycosylated hemoglobin  Lipid Panel     Component Value Date/Time   CHOL 149 11/16/2021 1214   TRIG 89 11/16/2021 1214   HDL 36 (L) 11/16/2021 1214   CHOLHDL 4.1 11/16/2021 1214   LDLCALC 96 11/16/2021 1214     Imaging I have reviewed images in epic and the results pertinent to this consultation are:  CT-scan of the brain 1. No evidence of acute intracranial abnormality.  ASPECTS is 10. 2. Chronic microvascular ischemic disease.  CTA head and neck with perf:  1. No emergent large vessel occlusion or proximal high-grade stenosis. 2. No evidence of core infarct or penumbra on CT perfusion. 3. Small (1-2 mm) outpouching arising from the inter communicating artery, suspicious for aneurysm.  MRI examination of the brain- no acute process   LTM 11/11-11/12 - Continuous slow, left hemisphere - Background asymmetry, left<right This study is suggestive of cortical dysfunction arising from left hemisphere likely secondary to underlying structural abnormality, post-ictal state. No seizures or epileptiform discharges were seen throughout the recording.   Labs: B1 pending  B12 386 RPR negative  TSH pending   Assessment:  88 year old patient with history of hypertension,  hyperlipidemia and diabetes was admitted yesterday with confusion, aphasia, right hemianopsia and left gaze deviation.  At baseline, she is normally alert and oriented with some memory loss.  Per her daughter she was at baseline normal mental status Friday evening  Recommendations: - LP under fluoro  - CSF labs: ME, protein, glucose, cell count, culture and gram stain, IgG, ogligoclonal bands, autoimmune panel  - supplement B12 for > 400  - Neurology will continue to follow   Gevena Mart DNP, ACNPC-AG    Attending addendum Fluoroscopy guided LP unsuccessful-could barely get 3 cc of blood-tinged sample. Since it is hard to rule out serious infection with that sample, would like to get MRI brain with contrast to look for any evidence of meningeal enhancement. We will also get long-term EEG-requested EEG technologist to hook up with MRI safe leads so that it does not interfere with MRI. We will continue to follow  Plan discussed with the patient's daughter at bedside Preliminary plan discussed with Dr. Irene Limbo.  -- Milon Dikes, MD Neurologist [facility] Pager: [phone]

## 2022-03-07 NOTE — Progress Notes (Signed)
Progress Note   Patient: Rebekah Avila YIR:[id number] DOB: [birth year] DOA: 03/05/2022     2 DOS: the patient was seen and examined on 03/07/2022   Brief hospital course: 88 year old woman PMH including diabetes hypertension hyperlipidemia who lives alone and performs all ADLs independently, found confused and aphasic by EMS, left gaze deviation and right hemianopsia, word finding difficulties and confusion.  CT head negative, CTA no LVO, no perfusion deficit.  Seen by neurology, MRI negative.  Obtaining LTM EEG and loaded with Keppra.  EEG was unrevealing.  LP was unfortunately difficult and low yield.  Plan for MRI as per neurology.  Assessment and Plan: Acute encephalopathy secondary to suspected seizure, focal left hemispheric deficit -- Imaging of the head and neck was unrevealing.  No evidence of stroke.  Concern for seizure.  Loaded on Keppra. -- EEG suggestive of cortical dysfunction arising from left hemisphere likely secondary to underlying structural abnormality, post-ictal state. No seizures or epileptiform discharges were seen throughout the recording.  --LP attempted by ED, unsuccessful secondary to patient movement.  Per neurology obtain LP under fluoroscopy 11/13 and follow-up CSF studies.  No signs or symptoms to suggest CNS infection.  Neurology obtaining MRI brain to assess for meningeal enhancement.  Also long-term EEG. --will need Cortrak if no improvement tomorrow  Acute urinary retention --RN unable to straight cath since this morning.  After some difficulty Foley catheter was placed for acute urinary retention and critical illness.    Hypokalemia, hypomagnesemia -- Repleted.  Phosphorus and magnesium within normal limits.   Essential hypertension --Blood pressure elevated but stable.  No evidence of stroke.     Controlled diabetes mellitus type 2 --Patient was not on any medications for treatment.  Glucose 101 on admission.  Last hemoglobin A1c 6.5 on 11/16/2021. --  Stable.  Continue supportive care   Hyperlipidemia --on pravastatin 80 mg daily Monday through Friday skipping weekends.  On hold for now given n.p.o. status.   Small (1-2 mm) outpouching arising from the inter communicating artery, suspicious for aneurysm. -- Follow-up as an outpatient no seizures found.  EEG was unrevealing.  LP unfortunately was difficult and minimal fluid obtained.  Subjective:  Nonverbal  Daughter at bedside  Physical Exam: Vitals:   03/07/22 0354 03/07/22 0859 03/07/22 1220 03/07/22 1636  BP: (!) 171/77 (!) 175/78 (!) 190/83 (!) 167/76  Pulse: 75 79 80 90  Resp: 16 20 18 20   Temp: 99.5 F (37.5 C) 99.1 F (37.3 C) 98.6 F (37 C) 98.7 F (37.1 C)  TempSrc: Axillary Axillary Axillary Axillary  SpO2: 100% 99% 100% 100%  Weight:      Height:       Physical Exam Vitals reviewed.  Constitutional:      General: She is not in acute distress.    Appearance: She is not ill-appearing or toxic-appearing.  Cardiovascular:     Rate and Rhythm: Normal rate and regular rhythm.     Heart sounds: No murmur heard. Pulmonary:     Effort: Pulmonary effort is normal. No respiratory distress.     Breath sounds: No wheezing, rhonchi or rales.  Neurological:     Mental Status: She is alert.  Psychiatric:     Comments: Nonverbal, opened eyes but did not respond with voice     Data Reviewed: K+ 3.4 Mg2+ WNL Phos WNL  Family Communication: daughter at bedside  Disposition: Status is: Inpatient Remains inpatient appropriate because: encephalopathic  Planned Discharge Destination:  TBD    Time spent:  35 minutes  Author: Brendia Sacks, MD 03/07/2022 7:04 PM  For on call review [email or web address].

## 2022-03-07 NOTE — Procedures (Signed)
PROCEDURE SUMMARY:  Technically difficult due to arthritic changes of the lumbar spine. Successful fluoroscopic guided lumbar puncture. No immediate complications.  Pt tolerated well.   EBL = none  Please see full dictation in imaging section of Epic for procedure details.  Electronically signed by: Gershon Crane, PA-C 04/21/2021 9:41 AM

## 2022-03-07 NOTE — TOC Initial Note (Signed)
Transition of Care (TOC) - Initial/Assessment Note    Patient Details  Name: Rebekah Avila MRN: [id number] Date of Birth: 06/23/1931  Transition of Care (TOC) CM/SW Contact:      , LCSWA Phone Number: 03/07/2022, 11:40 AM  Clinical Narrative:                 CSW met with dtr at bedside to discuss SNF recommendations. Dtr is on board with SNF placement and states pt has a friend at [facility] and her dad was in [place] about 15 years ago. Dtr agreeable to a faxout in the area. Pt is from home alone, dtr came up every weekend. Pt to go to SNF and then likely go to stay with daughter afterward. CSW to provide Medicare.gov information to dtr. TOC will continue to follow for DC needs.  Expected Discharge Plan: Skilled Nursing Facility Barriers to Discharge: Insurance Authorization, Continued Medical Work up, SNF Pending bed offer   Patient Goals and CMS Choice Patient states their goals for this hospitalization and ongoing recovery are:: Pt is disoriented and unable to participate in goal setting. CMS Medicare.gov Compare Post Acute Care list provided to:: Patient Represenative (must comment) (Daughter) Choice offered to / list presented to : Adult Children  Expected Discharge Plan and Services Expected Discharge Plan: Skilled Nursing Facility     Post Acute Care Choice: Skilled Nursing Facility Living arrangements for the past 2 months: Single Family Home                                      Prior Living Arrangements/Services Living arrangements for the past 2 months: Single Family Home Lives with:: Self Patient language and need for interpreter reviewed:: Yes Do you feel safe going back to the place where you live?: Yes      Need for Family Participation in Patient Care: Yes (Comment) Care giver support system in place?: Yes (comment)   Criminal Activity/Legal Involvement Pertinent to Current Situation/Hospitalization: No - Comment as needed  Activities of  Daily Living Home Assistive Devices/Equipment: None ADL Screening (condition at time of admission) Patient's cognitive ability adequate to safely complete daily activities?: No Is the patient deaf or have difficulty hearing?: No Does the patient have difficulty seeing, even when wearing glasses/contacts?: No Does the patient have difficulty concentrating, remembering, or making decisions?: Yes Patient able to express need for assistance with ADLs?: No Does the patient have difficulty dressing or bathing?: No Independently performs ADLs?: No Communication: Needs assistance Is this a change from baseline?: Change from baseline, expected to last >3 days Dressing (OT): Dependent, Independent Is this a change from baseline?: Change from baseline, expected to last >3 days Grooming: Dependent Is this a change from baseline?: Change from baseline, expected to last >3 days Feeding: Dependent Is this a change from baseline?: Change from baseline, expected to last >3 days Bathing: Dependent Is this a change from baseline?: Change from baseline, expected to last >3 days Toileting: Dependent Is this a change from baseline?: Change from baseline, expected to last >3days In/Out Bed: Dependent Is this a change from baseline?: Change from baseline, expected to last >3 days Walks in Home: Independent Does the patient have difficulty walking or climbing stairs?: No Weakness of Legs: None Weakness of Arms/Hands: None  Permission Sought/Granted Permission sought to share information with : Family Supports Permission granted to share information with : Yes, Verbal Permission Granted    Share Information with NAME: Gail Pirani     Permission granted to share info w Relationship: Daughter     Emotional Assessment Appearance:: Appears stated age Attitude/Demeanor/Rapport: Unable to Assess Affect (typically observed): Unable to Assess Orientation: : Oriented to Self Alcohol / Substance Use: Not  Applicable Psych Involvement: No (comment)  Admission diagnosis:  Neglect of one side of body [R41.4] Stroke-like symptoms [R29.90] Patient Active Problem List   Diagnosis Date Noted   Acute encephalopathy 03/05/2022   Aphasia 03/05/2022   Stroke-like symptoms 03/05/2022   Hypocalcemia 03/05/2022   Seizure (HCC) 03/05/2022   AKI (acute kidney injury) (HCC) 11/21/2021   Hypokalemia 11/21/2021   Type 2 diabetes mellitus (HCC) 11/21/2021   Hyperlipidemia 11/21/2021   Chronic kidney disease, stage 3a (HCC) 11/21/2021   Normocytic anemia 11/21/2021   Hyperbilirubinemia 11/21/2021   Hyperproteinemia 11/21/2021   Increased anion gap metabolic acidosis 11/21/2021   Polymyalgia rheumatica (HCC) 08/19/2019   Type 2 diabetes mellitus with stage 3 chronic kidney disease, without long-term current use of insulin (HCC) 04/12/2018   Nephropathy 08/03/2017   Hypertensive kidney disease with chronic kidney disease stage III (HCC) 08/03/2017   TIA (transient ischemic attack) 06/30/2013   HTN (hypertension) 06/30/2013   PCP:  Eubanks,  K, NP Pharmacy:   [facility] - [place], [place] - [address] [place] [address] Phone: [phone] Fax: [phone]     Social Determinants of Health (SDOH) Interventions    Readmission Risk Interventions     No data to display           

## 2022-03-07 NOTE — Progress Notes (Signed)
SLP Cancellation Note  Patient Details Name: Rebekah Avila MRN: [id number] DOB: 14-Dec-1931   Cancelled treatment:        SLP attempted to arouse for swallow assessment with max verbal and tactile stimulation without success. Returned for second attempt several hours later and daughter at bedside reported pt has not been arousable for her. Plan is to check back tomorrow.    Royce Macadamia 03/07/2022, 11:52 AM

## 2022-03-08 ENCOUNTER — Inpatient Hospital Stay (HOSPITAL_COMMUNITY): Payer: Medicare Other

## 2022-03-08 DIAGNOSIS — R569 Unspecified convulsions: Secondary | ICD-10-CM | POA: Diagnosis not present

## 2022-03-08 DIAGNOSIS — E119 Type 2 diabetes mellitus without complications: Secondary | ICD-10-CM | POA: Diagnosis not present

## 2022-03-08 DIAGNOSIS — G9340 Encephalopathy, unspecified: Secondary | ICD-10-CM | POA: Diagnosis not present

## 2022-03-08 DIAGNOSIS — R4182 Altered mental status, unspecified: Secondary | ICD-10-CM | POA: Diagnosis not present

## 2022-03-08 LAB — BASIC METABOLIC PANEL
Anion gap: 12 (ref 5–15)
BUN: 9 mg/dL (ref 8–23)
CO2: 21 mmol/L — ABNORMAL LOW (ref 22–32)
Calcium: 8.7 mg/dL — ABNORMAL LOW (ref 8.9–10.3)
Chloride: 107 mmol/L (ref 98–111)
Creatinine, Ser: 0.98 mg/dL (ref 0.44–1.00)
GFR, Estimated: 56 mL/min — ABNORMAL LOW (ref >60)
Glucose, Bld: 77 mg/dL (ref 70–99)
Potassium: 3.4 mmol/L — ABNORMAL LOW (ref 3.5–5.1)
Sodium: 140 mmol/L (ref 135–145)

## 2022-03-08 LAB — CBC
HCT: 27.8 % — ABNORMAL LOW (ref 36.0–46.0)
Hemoglobin: 9.8 g/dL — ABNORMAL LOW (ref 12.0–15.0)
MCH: 31.4 pg (ref 26.0–34.0)
MCHC: 35.3 g/dL (ref 30.0–36.0)
MCV: 89.1 fL (ref 80.0–100.0)
Platelets: 186 10*3/uL (ref 150–400)
RBC: 3.12 MIL/uL — ABNORMAL LOW (ref 3.87–5.11)
RDW: 12.9 % (ref 11.5–15.5)
WBC: 8 10*3/uL (ref 4.0–10.5)
nRBC: 0 % (ref 0.0–0.2)

## 2022-03-08 LAB — MISC LABCORP TEST (SEND OUT): Labcorp test code: 505535

## 2022-03-08 MED ORDER — PANTOPRAZOLE SODIUM 40 MG IV SOLR
40.0000 mg | Freq: Every day | INTRAVENOUS | Status: AC
  Administered 2022-03-08 – 2022-03-10 (×3): 40 mg via INTRAVENOUS
  Filled 2022-03-08 (×3): qty 10

## 2022-03-08 MED ORDER — GADOBUTROL 1 MMOL/ML IV SOLN
5.0000 mL | Freq: Once | INTRAVENOUS | Status: AC | PRN
  Administered 2022-03-08: 5 mL via INTRAVENOUS

## 2022-03-08 MED ORDER — SODIUM CHLORIDE 0.9 % IV SOLN
1000.0000 mg | INTRAVENOUS | Status: AC
  Administered 2022-03-08 – 2022-03-10 (×3): 1000 mg via INTRAVENOUS
  Filled 2022-03-08 (×3): qty 16

## 2022-03-08 MED ORDER — POTASSIUM CHLORIDE 10 MEQ/100ML IV SOLN
10.0000 meq | INTRAVENOUS | Status: AC
  Administered 2022-03-08 (×4): 10 meq via INTRAVENOUS
  Filled 2022-03-08 (×4): qty 100

## 2022-03-08 NOTE — Progress Notes (Signed)
Maintenance complete all impeds under 10, no skin breakdown at all skin sites.

## 2022-03-08 NOTE — Progress Notes (Signed)
Neurology Progress Note  Brief HPI: 88 year old patient with history of hypertension, hyperlipidemia and diabetes was admitted yesterday with confusion, aphasia, right hemianopsia and left gaze deviation.  At baseline, she is normally alert and oriented with some memory loss.  Per her daughter she was at baseline normal mental status Friday evening.  CT head showed no acute abnormality, CTA with perfusion was performed and revealed no LVO and no perfusion deficit.  MRI shows no acute abnormality.  Although patient does not have a history of seizures, there is a question of seizure activity with prolonged postictal confusion.  LTM EEG shows left sided slowing but no seizure activity.   S:// Patient's vital signs have been stable.  She is lying in bed in no acute distress.  She opens her eyes to touch and loud voice, does not answer questions or follow commands.  She is nonverbal.  LP under fluoroscopy performed yesterday, only 3cc bloody CSF obtained.  Cell counts not obtained but the differential is obtained as detailed below. Meningitis/encephalitis panel on CSF negative, CSF protein 296.   O:// Current vital signs: BP (!) 170/68 (BP Location: Left Arm)   Pulse 82   Temp (!) 97.4 F (36.3 C)   Resp 12   Ht 5' (1.524 m)   Wt 50.8 kg   SpO2 100%   BMI 21.87 kg/m  Vital signs in last 24 hours: Temp:  [97.4 F (36.3 C)-99.1 F (37.3 C)] 97.4 F (36.3 C) (11/14 0403) Pulse Rate:  [67-90] 82 (11/14 0403) Resp:  [12-20] 12 (11/14 0403) BP: (167-190)/(64-83) 170/68 (11/14 0403) SpO2:  [99 %-100 %] 100 % (11/14 0403)  GENERAL: frail elderly female in NAD HEENT: - Normocephalic and atraumatic, moist mm Ext: warm, well perfused, intact peripheral pulses, no edema  NEURO:  Mental Status: she awakens to tactile stimulation. She is not answering orientation questions. She is not following commands.  Language: speech unable to assess Cranial Nerves: PERRL, no gaze preference today.  Able to  track both sides.  Blinks to threat on both sides, no facial asymmetry, facial sensation intact, hearing intact. Motor: moves all 4 extremities equally and spontaneously  Tone: is normal and bulk is normal Sensation- Intact to light touch bilaterally Reflexes- 2+ right bicep, others unable to elicit Coordination: Unable to assess  Gait- deferred    Medications  Current Facility-Administered Medications:     stroke: early stages of recovery book, , Does not apply, Once, Katrinka Blazing, Rondell A, MD   acetaminophen (TYLENOL) tablet 650 mg, 650 mg, Oral, Q4H PRN **OR** acetaminophen (TYLENOL) 160 MG/5ML solution 650 mg, 650 mg, Per Tube, Q4H PRN **OR** acetaminophen (TYLENOL) suppository 650 mg, 650 mg, Rectal, Q4H PRN, Katrinka Blazing, Rondell A, MD   aspirin EC tablet 81 mg, 81 mg, Oral, QPM, Smith, Rondell A, MD   aspirin suppository 300 mg, 300 mg, Rectal, Once, Smith, Rondell A, MD   enoxaparin (LOVENOX) injection 30 mg, 30 mg, Subcutaneous, Q24H, Pham, Minh Q, RPH-CPP, 30 mg at 03/07/22 1940   hydrALAZINE (APRESOLINE) injection 10 mg, 10 mg, Intravenous, Q4H PRN, Clydie Braun, MD   lactated ringers infusion, , Intravenous, Continuous, Standley Brooking, MD, Last Rate: 125 mL/hr at 03/08/22 0609, New Bag at 03/08/22 0609   LORazepam (ATIVAN) injection 0.5 mg, 0.5 mg, Intravenous, Once, Standley Brooking, MD   sodium chloride flush (NS) 0.9 % injection 3 mL, 3 mL, Intravenous, Once, Rondel Baton, MD Labs CBC    Component Value Date/Time   WBC 8.0  03/08/2022 0355   RBC 3.12 (L) 03/08/2022 0355   HGB 9.8 (L) 03/08/2022 0355   HGB 11.0 (L) 11/16/2021 1214   HCT 27.8 (L) 03/08/2022 0355   HCT 32.4 (L) 11/16/2021 1214   PLT 186 03/08/2022 0355   PLT 228 11/16/2021 1214   MCV 89.1 03/08/2022 0355   MCV 88 11/16/2021 1214   MCH 31.4 03/08/2022 0355   MCHC 35.3 03/08/2022 0355   RDW 12.9 03/08/2022 0355   RDW 12.7 11/16/2021 1214   LYMPHSABS 0.8 03/05/2022 1525   MONOABS 0.6 03/05/2022  1525   EOSABS 0.0 03/05/2022 1525   BASOSABS 0.0 03/05/2022 1525    CMP     Component Value Date/Time   NA 140 03/08/2022 0355   NA 140 11/16/2021 1214   K 3.4 (L) 03/08/2022 0355   CL 107 03/08/2022 0355   CO2 21 (L) 03/08/2022 0355   GLUCOSE 77 03/08/2022 0355   BUN 9 03/08/2022 0355   BUN 36 (H) 11/16/2021 1214   CREATININE 0.98 03/08/2022 0355   CREATININE 0.91 03/01/2022 1136   CALCIUM 8.7 (L) 03/08/2022 0355   PROT 6.5 03/05/2022 1525   PROT 8.0 11/16/2021 1214   ALBUMIN 3.4 (L) 03/05/2022 1525   ALBUMIN 4.6 11/16/2021 1214   AST 23 03/05/2022 1525   ALT 11 03/05/2022 1525   ALKPHOS 32 (L) 03/05/2022 1525   BILITOT 0.8 03/05/2022 1525   BILITOT 0.7 11/16/2021 1214   GFRNONAA 56 (L) 03/08/2022 0355   GFRAA 62 12/17/2019 1008   Imaging I have reviewed images in epic and the results pertinent to this consultation are:  CT-scan of the brain 1. No evidence of acute intracranial abnormality.  ASPECTS is 10. 2. Chronic microvascular ischemic disease.  CTA head and neck with perf:  1. No emergent large vessel occlusion or proximal high-grade stenosis. 2. No evidence of core infarct or penumbra on CT perfusion. 3. Small (1-2 mm) outpouching arising from the inter communicating artery, suspicious for aneurysm.  MRI examination of the brain- no acute process   LTM 11/13-11/14 ABNORMALITY - Continuous slow, left temporal region - Background asymmetry, left<right - Spindles asymmetry, left<right   IMPRESSION: This study is suggestive of cortical dysfunction arising from left temporal region likely secondary to underlying structural abnormality, post-ictal state. No seizures or epileptiform discharges were seen throughout the recording.  Labs: B1 pending  B12 386 RPR negative  TSH pending   Assessment:  88 year old patient with history of hypertension, hyperlipidemia and diabetes was admitted yesterday with confusion, aphasia, right hemianopsia and left gaze  deviation.  At baseline, she is normally alert and oriented with some memory loss.  Per her daughter she was at baseline normal mental status Friday evening.  She continues to remain nonverbal and cannot follow commands.  LP under fluoroscopy resulted in only a small amount of bloody CSF, but meningitis encephalitis panel was negative, and protein was 296.  Will need to obtain brain MRI with and without contrast, LTM EEG in place but no seizure activity seen so far-but EEG does show left-sided dysfunction which can be from a multitude of etiologies including a postictal state as well as from a possible autoimmune encephalitis. Since the meningitis/encephalitis panel is negative, I will start her on steroids  Recommendations: -Send out serum paraneoplastic encephalitis panel and autoimmune encephalitis panel-unable to obtain much CSF, even under fluoroscopy guidance. -Solu-Medrol 1 g daily x3 days after serum labs are sent out. - supplement B12 for > 400  -If she does  not show any improvement after a dose of steroid, will then also give a trial of an antiepileptic. -We will continue LTM for now. - Neurology will continue to follow  Plan discussed with Dr. Sarajane Jews.   [place] , MSN, AGACNP-BC [facility] See Amion for schedule and pager information 03/08/2022 7:40 AM    Attending Neurohospitalist Addendum Patient seen and examined with APP/Resident. Agree with the history and physical as documented above. Agree with the plan as documented, which I helped formulate. I have independently reviewed the chart, obtained history, review of systems and examined the patient.I have personally reviewed pertinent head/neck/spine imaging (CT/MRI). Please feel free to call with any questions.  -- Amie Portland, MD Neurologist [facility] Pager: [phone]

## 2022-03-08 NOTE — Evaluation (Signed)
Clinical/Bedside Swallow Evaluation Patient Details  Name: Rebekah Avila MRN: [id number] Date of Birth: 03-27-1932  Today's Date: 03/08/2022 Time: SLP Start Time (ACUTE ONLY): 1150 SLP Stop Time (ACUTE ONLY): 1205 SLP Time Calculation (min) (ACUTE ONLY): 15 min  Past Medical History:  Past Medical History:  Diagnosis Date   Diabetes (HCC)    High cholesterol    Hypertension    Polymyalgia rheumatica (HCC)    Renal disorder    Past Surgical History:  Past Surgical History:  Procedure Laterality Date   ECTOPIC PREGNANCY SURGERY     HERNIA REPAIR     HPI:  88 yo female adm to [facility] with AMS= left gaze preference, aphasia- suspected seizure.  MRI showed Pt is s/p LP 11/13 with minimal fluid removed - and pt is currently on EEG - EEG showed "suggestive of cortical dysfunction arising from left temporal region likely secondary to underlying structural abnormality, post-ictal state. No seizures or epileptiform discharges were seen throughout the recording".  MRI 03/05/2022 . Small remote right cerebellar lacunar  infarcts. Moderate patchy T2/FLAIR hyperintensities within the white  matter, compatible with chronic microvascular ischemic disease.  Cerebral atrophy.   Speech and swallow evaluation ordered. Daughter present during session and advised that pt eats regular/thin diet at home and has no communication deficits. She tends to lay to the right side per daughter.  Patient has not been seen by SLP per chart review - due to her AMS.    Assessment / Plan / Recommendation  Clinical Impression  Pt's alert today - opened eyes but not following directions nor vocalizing. She did not allow SLP to brush her gums/clean her mouth.  Was able to present 1/2 tsp of thin water and nectar liquids only. Anterior spillage and poor awareness across all boluses.  Delayed audible swallow observed - suspect oral more than pharyngeal with subtle eructation noted x1.   Using hand over hand cues with cup holding, pt  pushed away the cups with two attempts. At this time, she is not appropriate for po intake - suspect component of oral dysphagia due to left temporal lobe involvment but also motivation.  Daughter reports pt is not answering her questions nor verbalizing today.  Educated daughter, pt and RN to recommendations and requested RN set up oral suction. Will follow up next date for dysphagia treatment and SLE as pt will participate.  Thanks. SLP Visit Diagnosis: Dysphagia, oral phase (R13.11);Dysphagia, unspecified (R13.10)    Aspiration Risk  Severe aspiration risk;Risk for inadequate nutrition/hydration    Diet Recommendation NPO   Medication Administration: Via alternative means    Other  Recommendations Oral Care Recommendations: Oral care QID Other Recommendations: Have oral suction available    Recommendations for follow up therapy are one component of a multi-disciplinary discharge planning process, led by the attending physician.  Recommendations may be updated based on patient status, additional functional criteria and insurance authorization.  Follow up Recommendations Skilled nursing-short term rehab (<3 hours/day)      Assistance Recommended at Discharge  Total assist  Functional Status Assessment Patient has had a recent decline in their functional status and demonstrates the ability to make significant improvements in function in a reasonable and predictable amount of time.  Frequency and Duration min 1 x/week  2 weeks       Prognosis Prognosis for Safe Diet Advancement: Fair Barriers to Reach Goals: Severity of deficits;Motivation      Swallow Study   General Date of Onset: 03/08/22 HPI: 88 yo  female adm to [facility] with AMS= left gaze preference, aphasia- suspected seizure.  MRI showed Pt is s/p LP 11/13 with minimal fluid removed - and pt is currently on EEG - EEG showed "suggestive of cortical dysfunction arising from left temporal region likely secondary to underlying  structural abnormality, post-ictal state. No seizures or epileptiform discharges were seen throughout the recording".  MRI 03/05/2022 . Small remote right cerebellar lacunar  infarcts. Moderate patchy T2/FLAIR hyperintensities within the white  matter, compatible with chronic microvascular ischemic disease.  Cerebral atrophy.   Speech and swallow evaluation ordered. Daughter present during session and advised that pt eats regular/thin diet at home and has no communication deficits. She tends to lay to the right side per daughter.  Patient has not been seen by SLP per chart review - due to her AMS. Type of Study: Bedside Swallow Evaluation Previous Swallow Assessment: none in the system Diet Prior to this Study: NPO Temperature Spikes Noted: No Respiratory Status: Room air History of Recent Intubation: No Behavior/Cognition: Alert;Cooperative;Pleasant mood Oral Cavity Assessment: Within Functional Limits Oral Care Completed by SLP: No (pt refused) Vision: Impaired for self-feeding Self-Feeding Abilities: Able to feed self Patient Positioning: Upright in bed Baseline Vocal Quality: Other (comment) (pt did not phonate) Volitional Cough: Cognitively unable to elicit Volitional Swallow: Unable to elicit    Oral/Motor/Sensory Function     Ice Chips Other Comments: pt refused did not open mouth to accept   Thin Liquid Thin Liquid: Impaired Presentation: Cup;Spoon;Straw Oral Phase Impairments: Reduced labial seal;Poor awareness of bolus;Reduced lingual movement/coordination Oral Phase Functional Implications: Right anterior spillage;Left anterior spillage;Prolonged oral transit;Oral holding Pharyngeal  Phase Impairments: Suspected delayed Swallow    Nectar Thick Nectar Thick Liquid: Impaired Presentation: Cup;Spoon;Straw Oral Phase Impairments: Reduced labial seal;Poor awareness of bolus;Reduced lingual movement/coordination Oral phase functional implications: Oral holding;Prolonged oral  transit Pharyngeal Phase Impairments: Suspected delayed Swallow   Honey Thick Honey Thick Liquid: Not tested   Puree Puree: Not tested Other Comments: pt refused   Solid     Solid: Not tested      Chales Abrahams 03/08/2022,12:23 PM   Rolena Infante, MS [facility] SLP Acute Rehab Services Office [phone] Pager [phone]

## 2022-03-08 NOTE — Progress Notes (Signed)
Physical Therapy Treatment Patient Details Name: Rebekah Avila MRN: [id number] DOB: [birth year] Today's Date: 03/08/2022   History of Present Illness Rebekah Avila is a 88 y.o. female who presented as code stroke with confusion, L gaze, and difficulty speaking. Stroke workup was negative. Pt with history of hypertension, hyperlipidemia and diabetes    PT Comments    Patient progressing with arousal/alertness, though not following commands well.  She looked at daughter and smiled and smiled at PT, though no attempt to help herself with washing her face or using tooth brush.  She remains most appropriate for SNF level rehab at d/c.   Recommendations for follow up therapy are one component of a multi-disciplinary discharge planning process, led by the attending physician.  Recommendations may be updated based on patient status, additional functional criteria and insurance authorization.  Follow Up Recommendations  Skilled nursing-short term rehab (<3 hours/day) Can patient physically be transported by private vehicle: No   Assistance Recommended at Discharge Frequent or constant Supervision/Assistance  Patient can return home with the following A lot of help with bathing/dressing/bathroom;A lot of help with walking and/or transfers;Assistance with feeding;Help with stairs or ramp for entrance   Equipment Recommendations  Rolling walker (2 wheels);BSC/3in1    Recommendations for Other Services       Precautions / Restrictions Precautions Precautions: Fall Precaution Comments: continuous EEG     Mobility  Bed Mobility Overal bed mobility: Needs Assistance Bed Mobility: Rolling, Sidelying to Sit, Sit to Supine Rolling: Mod assist, +2 for safety/equipment Sidelying to sit: Mod assist, +2 for physical assistance   Sit to supine: +2 for safety/equipment, Mod assist   General bed mobility comments: rolling R with pt assisting to flex knees once assisted to bend one knee, then using L  hand to turn to R pulling with A; side to sit with assist for legs off bed and to lift trunk, pt assisting with trunk    Transfers Overall transfer level: Needs assistance Equipment used: 2 person hand held assist   Sit to Stand: Total assist, +2 physical assistance           General transfer comment: attempted x 3 to stand; but pt not engaging hip extensors despite lifting with bed pad under her    Ambulation/Gait                   Stairs             Wheelchair Mobility    Modified Rankin (Stroke Patients Only)       Balance Overall balance assessment: Needs assistance Sitting-balance support: Feet supported Sitting balance-Leahy Scale: Fair Sitting balance - Comments: sitting on EOB with feet supported, no LOB and pt not needing UE support     Standing balance-Leahy Scale: Zero                              Cognition Arousal/Alertness: Awake/alert Behavior During Therapy: Flat affect Overall Cognitive Status: Impaired/Different from baseline Area of Impairment: Attention, Following commands, Safety/judgement, Awareness, Problem solving                   Current Attention Level: Focused   Following Commands: Follows one step commands inconsistently   Awareness: Intellectual            Exercises      General Comments General comments (skin integrity, edema, etc.): daughter present and supportive; still on continuous EEG and  nursing staff just finished cleaning pt prior to mobility.  Attempted to engage in face washing, introduced tooth brush and placed in pt's hand and assisted to initiate marching in sitting, but pt not following commands for any task, some intermittent squeezing of hand, though not specifically to commands      Pertinent Vitals/Pain Pain Assessment Pain Assessment: Faces Faces Pain Scale: No hurt    Home Living                          Prior Function            PT Goals (current  goals can now be found in the care plan section) Progress towards PT goals: Progressing toward goals    Frequency    Min 2X/week      PT Plan Current plan remains appropriate    Co-evaluation              AM-PAC PT "6 Clicks" Mobility   Outcome Measure  Help needed turning from your back to your side while in a flat bed without using bedrails?: Total Help needed moving from lying on your back to sitting on the side of a flat bed without using bedrails?: Total Help needed moving to and from a bed to a chair (including a wheelchair)?: Total Help needed standing up from a chair using your arms (e.g., wheelchair or bedside chair)?: Total Help needed to walk in hospital room?: Total Help needed climbing 3-5 steps with a railing? : Total 6 Click Score: 6    End of Session Equipment Utilized During Treatment: Gait belt Activity Tolerance: Patient limited by fatigue Patient left: in bed;with call bell/phone within reach;with bed alarm set Nurse Communication: Mobility status PT Visit Diagnosis: Other abnormalities of gait and mobility (R26.89);Other symptoms and signs involving the nervous system (R29.898)     Time: 1437-1500 PT Time Calculation (min) (ACUTE ONLY): 23 min  Charges:  $Therapeutic Activity: 23-37 mins                     Sheran Lawless, PT Acute Rehabilitation Services [phone] 03/08/2022    Elray Mcgregor 03/08/2022, 4:19 PM

## 2022-03-08 NOTE — NC FL2 (Signed)
[place] MEDICAID FL2 LEVEL OF CARE SCREENING TOOL     IDENTIFICATION  Patient Name: Rebekah Avila Birthdate: Nov 30, 1931 Sex: female Admission Date (Current Location): 03/05/2022  [facility] and [place] Number:  Producer, television/film/video and Address:  [facility] [place], [address] [place], [place] [address]      Provider Number: [id number]  Attending Physician Name and Address:  Standley Brooking, MD  Relative Name and Phone Number:  Roseanne Juenger,  [phone]    Current Level of Care: Hospital Recommended Level of Care: Skilled Nursing Facility Prior Approval Number:    Date Approved/Denied:   PASRR Number: [id number]  Discharge Plan: SNF    Current Diagnoses: Patient Active Problem List   Diagnosis Date Noted   Acute encephalopathy 03/05/2022   Aphasia 03/05/2022   Stroke-like symptoms 03/05/2022   Hypocalcemia 03/05/2022   Seizure (HCC) 03/05/2022   AKI (acute kidney injury) (HCC) 11/21/2021   Hypokalemia 11/21/2021   Type 2 diabetes mellitus (HCC) 11/21/2021   Hyperlipidemia 11/21/2021   Chronic kidney disease, stage 3a (HCC) 11/21/2021   Normocytic anemia 11/21/2021   Hyperbilirubinemia 11/21/2021   Hyperproteinemia 11/21/2021   Increased anion gap metabolic acidosis 11/21/2021   Polymyalgia rheumatica (HCC) 08/19/2019   Type 2 diabetes mellitus with stage 3 chronic kidney disease, without long-term current use of insulin (HCC) 04/12/2018   Nephropathy 08/03/2017   Hypertensive kidney disease with chronic kidney disease stage III (HCC) 08/03/2017   TIA (transient ischemic attack) 06/30/2013   HTN (hypertension) 06/30/2013    Orientation RESPIRATION BLADDER Height & Weight      (Disoriented x4)  Normal Incontinent, Indwelling catheter Weight: 111 lb 15.9 oz (50.8 kg) Height:  5' (152.4 cm)  BEHAVIORAL SYMPTOMS/MOOD NEUROLOGICAL BOWEL NUTRITION STATUS      Incontinent Diet (See DC summary)  AMBULATORY STATUS COMMUNICATION OF NEEDS  Skin   Extensive Assist Verbally Normal                       Personal Care Assistance Level of Assistance  Bathing, Feeding, Dressing Bathing Assistance: Maximum assistance Feeding assistance: Maximum assistance Dressing Assistance: Maximum assistance     Functional Limitations Info  Sight, Hearing, Speech Sight Info: Adequate Hearing Info: Adequate Speech Info: Adequate    SPECIAL CARE FACTORS FREQUENCY  PT (By licensed PT), OT (By licensed OT)     PT Frequency: 5x week OT Frequency: 5x week            Contractures Contractures Info: Not present    Additional Factors Info  Code Status, Allergies Code Status Info: full Allergies Info: Morphine and related           Current Medications (03/08/2022):  This is the current hospital active medication list Current Facility-Administered Medications  Medication Dose Route Frequency Provider Last Rate Last Admin    stroke: early stages of recovery book   Does not apply Once Clydie Braun, MD       acetaminophen (TYLENOL) tablet 650 mg  650 mg Oral Q4H PRN Madelyn Flavors A, MD       Or   acetaminophen (TYLENOL) 160 MG/5ML solution 650 mg  650 mg Per Tube Q4H PRN Madelyn Flavors A, MD       Or   acetaminophen (TYLENOL) suppository 650 mg  650 mg Rectal Q4H PRN Madelyn Flavors A, MD       aspirin EC tablet 81 mg  81 mg Oral QPM Clydie Braun, MD  aspirin suppository 300 mg  300 mg Rectal Once Madelyn Flavors A, MD       enoxaparin (LOVENOX) injection 30 mg  30 mg Subcutaneous Q24H Pham, Minh Q, RPH-CPP   30 mg at 03/07/22 1940   hydrALAZINE (APRESOLINE) injection 10 mg  10 mg Intravenous Q4H PRN Clydie Braun, MD       lactated ringers infusion   Intravenous Continuous Standley Brooking, MD 125 mL/hr at 03/08/22 0609 New Bag at 03/08/22 0609   LORazepam (ATIVAN) injection 0.5 mg  0.5 mg Intravenous Once Standley Brooking, MD       potassium chloride 10 mEq in 100 mL IVPB  10 mEq Intravenous Q1 Hr x 4  Standley Brooking, MD       sodium chloride flush (NS) 0.9 % injection 3 mL  3 mL Intravenous Once Rondel Baton, MD         Discharge Medications: Please see discharge summary for a list of discharge medications.  Relevant Imaging Results:  Relevant Lab Results:   Additional Information SS# [id number], [address]

## 2022-03-08 NOTE — Progress Notes (Signed)
  Progress Note   Patient: Rebekah Avila OZD:[id number] DOB: 10-09-1931 DOA: 03/05/2022     3 DOS: the patient was seen and examined on 03/08/2022   Brief hospital course: 88 year old woman PMH including diabetes hypertension hyperlipidemia who lives alone and performs all ADLs independently, found confused and aphasic by EMS, left gaze deviation and right hemianopsia, word finding difficulties and confusion.  CT head negative, CTA no LVO, no perfusion deficit.  Seen by neurology, MRI negative.  Obtaining LTM EEG and loaded with Keppra.  EEG was unrevealing.  LP was unfortunately difficult and low yield.  Plan for MRI as per neurology.  Assessment and Plan: Acute encephalopathy secondary to suspected seizure, focal left hemispheric deficit -- Imaging of the head and neck was unrevealing.  No evidence of stroke.  Concern for seizure.  Loaded on Keppra. -- EEG suggestive of cortical dysfunction arising from left hemisphere likely secondary to underlying structural abnormality, post-ictal state. No seizures or epileptiform discharges were seen throughout the recording.  --LP attempted by ED, unsuccessful secondary to patient movement.  LP under fluoroscopy 11/13 was technically successful but too little fluid for analysis.  MRI brain to assess for meningeal enhancement unremarkable.  Also long-term EEG. --place Cortrak  --treat as autoimmune encephalitis with steroids per neurology  Acute urinary retention --After some difficulty Foley catheter was placed 11/13 for acute urinary retention and critical illness.    Hypokalemia, hypomagnesemia -- Repleted.  Phosphorus and magnesium within normal limits.   Essential hypertension --Blood pressure elevated but stable.  No evidence of stroke.     Controlled diabetes mellitus type 2 --Patient was not on any medications for treatment.  Glucose 101 on admission.  Last hemoglobin A1c 6.5 on 11/16/2021. -- Stable.  Continue supportive care    Hyperlipidemia --on pravastatin 80 mg daily Monday through Friday skipping weekends.  On hold for now given n.p.o. status.   Small (1-2 mm) outpouching arising from the inter communicating artery, suspicious for aneurysm.     Subjective:  LTM EEG placed LP too little fluid to be diagnostic  Physical Exam: Vitals:   03/07/22 1940 03/08/22 0025 03/08/22 0403 03/08/22 0819  BP: (!) 184/64 (!) 172/78 (!) 170/68 (!) 165/70  Pulse: 78 67 82 83  Resp: 16 18 12 15   Temp: 97.9 F (36.6 C) 98 F (36.7 C) (!) 97.4 F (36.3 C) 98.9 F (37.2 C)  TempSrc: Oral Oral  Axillary  SpO2: 100% 100% 100% 99%  Weight:      Height:       Physical Exam Vitals reviewed.  Constitutional:      General: She is not in acute distress.    Appearance: She is not ill-appearing or toxic-appearing.     Comments: Alerts to voice and orients face but does not follow commands or speak  Cardiovascular:     Rate and Rhythm: Normal rate and regular rhythm.     Heart sounds: No murmur heard. Pulmonary:     Effort: Pulmonary effort is normal. No respiratory distress.     Breath sounds: No wheezing, rhonchi or rales.  Neurological:     Mental Status: She is alert.     Data Reviewed: K+ 3.4 Hgb stable 9.8  Family Communication: daughter at bedside  Disposition: Status is: Inpatient Remains inpatient appropriate because: encephalopathic  Planned Discharge Destination:  TBD    Time spent: 35 minutes  Author: , MD 03/08/2022 8:42 AM  For on call review [email or web address].

## 2022-03-08 NOTE — Procedures (Signed)
Patient Name: Rebekah Avila  MRN: [id number]  Epilepsy Attending: Charlsie Quest  Referring Physician/Provider: Milon Dikes, MD   Duration: 03/07/2022 1837 to 03/08/2022 2000   Patient history: 88yo F with ams. EEG to evaluate for seizure   Level of alertness: Awake, asleep   AEDs during EEG study: None   Technical aspects: This EEG study was done with scalp electrodes positioned according to the 10-20 International system of electrode placement. Electrical activity was reviewed with band pass filter of 1-70Hz , sensitivity of 7 uV/mm, display speed of 61mm/sec with a 60Hz  notched filter applied as appropriate. EEG data were recorded continuously and digitally stored.  Video monitoring was available and reviewed as appropriate.   Description: The posterior dominant rhythm consists of 8Hz  activity of moderate voltage (25-35 uV) seen predominantly in posterior head regions, asymmetric ( left<right) and reactive to eye opening and eye closing. Sleep was characterized by vertex waves, sleep spindles (12 to 14 Hz), asymmetric ( left<right) maximal frontocentral region. EEG showed continuous 3 to 5 Hz 11 delta slowing in left temporal region. Hyperventilation and photic stimulation were not performed.     EEG was disconnected between 03/08/2022 1300 to 1427 for MRI   ABNORMALITY - Continuous slow, left temporal region - Background asymmetry, left<right - Spindles asymmetry, left<right   IMPRESSION: This study is suggestive of cortical dysfunction arising from left temporal region likely secondary to underlying structural abnormality, post-ictal state. No seizures or epileptiform discharges were seen throughout the recording.   Jabree Rebert 

## 2022-03-08 NOTE — TOC Progression Note (Signed)
Transition of Care [facility]) - Progression Note    Patient Details  Name: Rebekah Avila MRN: [id number] Date of Birth: 05-06-1931  Transition of [facility]) CM/SW Contact  Carley Hammed, [place] Phone Number: 03/08/2022, 1:57 PM  Clinical Narrative:     CSW followed up with pt's daughter Rebekah Avila  and provided bed offers. Family has chosen [facility], authorization started, facility notified. TOC will continue to follow.   Expected Discharge Plan: Skilled Nursing Facility Barriers to Discharge: English as a second language teacher, Continued Medical Work up, SNF Pending bed offer  Expected Discharge Plan and Services Expected Discharge Plan: Skilled Nursing Facility     Post Acute Care Choice: Skilled Nursing Facility Living arrangements for the past 2 months: Single Family Home                                       Social Determinants of Health (SDOH) Interventions    Readmission Risk Interventions     No data to display

## 2022-03-08 NOTE — Care Management Important Message (Signed)
Important Message  Patient Details  Name: Rebekah Avila MRN: [id number] Date of Birth: 1932-03-15   Medicare Important Message Given:  Yes     Dorena Bodo 03/08/2022, 2:43 PM

## 2022-03-08 NOTE — Evaluation (Signed)
SLP Cancellation Note  Patient Details Name: DAWN CONVERY MRN: [id number] DOB: 01-13-1932   Cancelled treatment:       Reason Eval/Treat Not Completed: Fatigue/lethargy limiting ability to participate (pt having EEG leads replaced, currently is sleeping, daughter present and denies pt having issues swallowing prior to admission.)  Will continue efforts.   Rolena Infante, MS [facility] SLP Acute Rehab Services Office [phone] Pager [phone]  Chales Abrahams 03/08/2022, 10:05 AM

## 2022-03-09 DIAGNOSIS — E119 Type 2 diabetes mellitus without complications: Secondary | ICD-10-CM | POA: Diagnosis not present

## 2022-03-09 DIAGNOSIS — E785 Hyperlipidemia, unspecified: Secondary | ICD-10-CM

## 2022-03-09 DIAGNOSIS — R4182 Altered mental status, unspecified: Secondary | ICD-10-CM | POA: Diagnosis not present

## 2022-03-09 DIAGNOSIS — I10 Essential (primary) hypertension: Secondary | ICD-10-CM

## 2022-03-09 DIAGNOSIS — R569 Unspecified convulsions: Secondary | ICD-10-CM | POA: Diagnosis not present

## 2022-03-09 DIAGNOSIS — G9340 Encephalopathy, unspecified: Secondary | ICD-10-CM | POA: Diagnosis not present

## 2022-03-09 LAB — BASIC METABOLIC PANEL
Anion gap: 19 — ABNORMAL HIGH (ref 5–15)
BUN: 15 mg/dL (ref 8–23)
CO2: 16 mmol/L — ABNORMAL LOW (ref 22–32)
Calcium: 8.9 mg/dL (ref 8.9–10.3)
Chloride: 104 mmol/L (ref 98–111)
Creatinine, Ser: 1.07 mg/dL — ABNORMAL HIGH (ref 0.44–1.00)
GFR, Estimated: 50 mL/min — ABNORMAL LOW (ref >60)
Glucose, Bld: 148 mg/dL — ABNORMAL HIGH (ref 70–99)
Potassium: 3.4 mmol/L — ABNORMAL LOW (ref 3.5–5.1)
Sodium: 139 mmol/L (ref 135–145)

## 2022-03-09 LAB — MISC LABCORP TEST (SEND OUT): Labcorp test code: 505575

## 2022-03-09 LAB — VITAMIN B1: Vitamin B1 (Thiamine): 96.1 nmol/L (ref 66.5–200.0)

## 2022-03-09 LAB — MAGNESIUM: Magnesium: 1.4 mg/dL — ABNORMAL LOW (ref 1.7–2.4)

## 2022-03-09 LAB — PHOSPHORUS: Phosphorus: 4.2 mg/dL (ref 2.5–4.6)

## 2022-03-09 MED ORDER — LEVETIRACETAM IN NACL 1000 MG/100ML IV SOLN
1000.0000 mg | Freq: Once | INTRAVENOUS | Status: AC
  Administered 2022-03-09: 1000 mg via INTRAVENOUS
  Filled 2022-03-09: qty 100

## 2022-03-09 MED ORDER — MAGNESIUM SULFATE 2 GM/50ML IV SOLN
2.0000 g | Freq: Once | INTRAVENOUS | Status: AC
  Administered 2022-03-09: 2 g via INTRAVENOUS
  Filled 2022-03-09: qty 50

## 2022-03-09 MED ORDER — LEVETIRACETAM 500 MG PO TABS
500.0000 mg | ORAL_TABLET | Freq: Two times a day (BID) | ORAL | Status: DC
  Administered 2022-03-09 – 2022-03-14 (×10): 500 mg via ORAL
  Filled 2022-03-09 (×10): qty 1

## 2022-03-09 MED ORDER — CHLORHEXIDINE GLUCONATE CLOTH 2 % EX PADS
6.0000 | MEDICATED_PAD | Freq: Every day | CUTANEOUS | Status: DC
  Administered 2022-03-09 – 2022-03-14 (×5): 6 via TOPICAL

## 2022-03-09 MED ORDER — TAMSULOSIN HCL 0.4 MG PO CAPS
0.4000 mg | ORAL_CAPSULE | Freq: Every day | ORAL | Status: DC
  Administered 2022-03-09 – 2022-03-13 (×5): 0.4 mg via ORAL
  Filled 2022-03-09 (×5): qty 1

## 2022-03-09 MED ORDER — POTASSIUM CHLORIDE 10 MEQ/100ML IV SOLN
10.0000 meq | INTRAVENOUS | Status: AC
  Administered 2022-03-09 (×2): 10 meq via INTRAVENOUS
  Filled 2022-03-09 (×2): qty 100

## 2022-03-09 MED ORDER — LEVETIRACETAM IN NACL 500 MG/100ML IV SOLN
500.0000 mg | Freq: Two times a day (BID) | INTRAVENOUS | Status: DC
  Filled 2022-03-09 (×3): qty 100

## 2022-03-09 NOTE — Evaluation (Signed)
Speech Language Pathology Evaluation Patient Details Name: Rebekah Avila MRN: [id number] DOB: 1932-03-18 Today's Date: 03/09/2022 Time: 5400-8676 SLP Time Calculation (min) (ACUTE ONLY): 15 min  Problem List:  Patient Active Problem List   Diagnosis Date Noted   Acute encephalopathy 03/05/2022   Aphasia 03/05/2022   Stroke-like symptoms 03/05/2022   Hypocalcemia 03/05/2022   Seizure (HCC) 03/05/2022   AKI (acute kidney injury) (HCC) 11/21/2021   Hypokalemia 11/21/2021   Type 2 diabetes mellitus (HCC) 11/21/2021   Hyperlipidemia 11/21/2021   Chronic kidney disease, stage 3a (HCC) 11/21/2021   Normocytic anemia 11/21/2021   Hyperbilirubinemia 11/21/2021   Hyperproteinemia 11/21/2021   Increased anion gap metabolic acidosis 11/21/2021   Polymyalgia rheumatica (HCC) 08/19/2019   Type 2 diabetes mellitus with stage 3 chronic kidney disease, without long-term current use of insulin (HCC) 04/12/2018   Nephropathy 08/03/2017   Hypertensive kidney disease with chronic kidney disease stage III (HCC) 08/03/2017   TIA (transient ischemic attack) 06/30/2013   HTN (hypertension) 06/30/2013   Past Medical History:  Past Medical History:  Diagnosis Date   Diabetes (HCC)    High cholesterol    Hypertension    Polymyalgia rheumatica (HCC)    Renal disorder    Past Surgical History:  Past Surgical History:  Procedure Laterality Date   ECTOPIC PREGNANCY SURGERY     HERNIA REPAIR     HPI:  88 yo female adm [facility] with AMS= left gaze preference, aphasia- suspected seizure.  MRI showed Pt is s/p LP 11/13 with minimal fluid removed - and pt is currently on EEG - EEG showed "suggestive of cortical dysfunction arising from left temporal region likely secondary to underlying structural abnormality, post-ictal state. No seizures or epileptiform discharges were seen throughout the recording".  MRI 03/05/2022 . Small remote right cerebellar lacunar  infarcts. Moderate patchy T2/FLAIR  hyperintensities within the white  matter, compatible with chronic microvascular ischemic disease.  Cerebral atrophy.   Speech and swallow evaluation ordered. Daughter present during session and advised that pt eats regular/thin diet at home and has no communication deficits. She tends to lay to the right side per daughter.  Patient has not been seen by SLP per chart review - due to her AMS.   Assessment / Plan / Recommendation Clinical Impression  Patient presenting with a mod-severe, global aphasia and cognitive impairment as per this evaluation. When SLP arrived in room, patient smiling and did communicate with some automatic social language. She was not able to answer any Yes/No questions even for basic biographical information (Is your name Robbi?, etc) and she would just reply "yes". With open-ended questions, her responses contained semantic paraphasias and were not related at all to question that was posed. There was one instance when her response was in the correct category, such as responding "three" when asked how many fingers SLP was holding up (correct answer was two). She was able to perform familiar actions such as feeding self with spoon and drinking form cup, but was not able to then drink from straw. SLP questioning some level of apraxia with primarily her left hand. No family present to determine baseline but suspect she is significantly different. Per brief discussion with PT who saw her previous date, she seems to have improved somewhat as she was completely non-verbal previous date and was not able to hold or manipulate a toothbrush. SLP recommending continued skilled intervention and patient will most likely require SNF level SLP services at time of hospital discharge.  SLP Assessment  SLP Recommendation/Assessment: Patient needs continued Speech Lanaguage Pathology Services SLP Visit Diagnosis: Cognitive communication deficit (R41.841);Aphasia (R47.01)    Recommendations for  follow up therapy are one component of a multi-disciplinary discharge planning process, led by the attending physician.  Recommendations may be updated based on patient status, additional functional criteria and insurance authorization.    Follow Up Recommendations  Skilled nursing-short term rehab (<3 hours/day)    Assistance Recommended at Discharge  Frequent or constant Supervision/Assistance  Functional Status Assessment Patient has had a recent decline in their functional status and demonstrates the ability to make significant improvements in function in a reasonable and predictable amount of time.  Frequency and Duration min 2x/week  2 weeks      SLP Evaluation Cognition  Overall Cognitive Status: Impaired/Different from baseline Arousal/Alertness: Awake/alert Orientation Level: Other (comment) (unable to respond due to severe aphasia) Attention: Sustained Sustained Attention: Impaired Sustained Attention Impairment: Functional basic       Comprehension  Auditory Comprehension Overall Auditory Comprehension: Impaired Yes/No Questions: Impaired Basic Biographical Questions: 0-25% accurate (0%) Commands: Impaired One Step Basic Commands: 0-24% accurate Visual Recognition/Discrimination Discrimination: Not tested Reading Comprehension Reading Status: Not tested    Expression Expression Primary Mode of Expression: Verbal Verbal Expression Overall Verbal Expression: Impaired Automatic Speech: Social Response Level of Generative/Spontaneous Verbalization: Word;Phrase Repetition: Impaired Level of Impairment: Word level Naming: Impairment Responsive: 0-25% accurate (0%) Confrontation: Impaired Verbal Errors: Semantic paraphasias;Phonemic paraphasias;Not aware of errors Effective Techniques: Other (Comment) (Hand over hand/tactile cues) Non-Verbal Means of Communication: Not applicable   Oral / Motor  Oral Motor/Sensory Function Overall Oral Motor/Sensory Function:  Within functional limits Motor Speech Overall Motor Speech: Appears within functional limits for tasks assessed Respiration: Within functional limits Resonance: Within functional limits Intelligibility: Unable to assess (comment)           Angela Nevin, MA, CCC-SLP Speech Therapy

## 2022-03-09 NOTE — Progress Notes (Signed)
PROGRESS NOTE    Rebekah Avila  NWG:[id number] DOB: [birth year] DOA: 03/05/2022 PCP: Sharon SellerEubanks, Jessica K, NP   Brief Narrative:  88 year old female with history of diabetes mellitus type 2, hypertension, hyperlipidemia who apparently lives alone and performs all ADLs independently was found confused and aphasic by EMS with left gaze deviation and right hemianopsia along with word finding difficulties and confusion.  On presentation, CT head was negative for acute intracranial abnormality.  CTA of head and neck showed no LVO, no perfusion deficit.  Seen by neurology.  MRI of brain was negative for acute intracranial abnormality.  Assessment & Plan:   Acute encephalopathy suspected due to possible seizure, focal left hemispheric deficit Dysphagia -Imaging of the head and neck including CT of the head; CTA of head and neck; MRI brain has been unrevealing so far. -Concern for seizure: Initially started on Keppra but subsequently discontinued by neurology.  Neurology following.  EEG negative for seizure.  Currently LTM EEG ongoing -Treated with 1 g IV Solu-Medrol daily for 3 days as per neurology for possible autoimmune encephalitis. -Unable to obtain much CSF even under fluoroscopy guidance -Mental status has not improved much. -SLP recommends n.p.o.   -PT/OT following and recommending SNF placement.  TOC following  Acute urinary retention -Foley catheter placed on 03/07/2022.  Start Flomax if able to tolerate orally  Essential hypertension -Blood pressure on the higher side.  Might have to start antihypertensives.  Diabetes mellitus type 2, controlled. -A1c 6.5 on 11/16/2021.  Not on any medications at home  Hyperlipidemia -On pravastatin daily Monday through Friday, skipping weekends.  Currently on hold because of n.p.o. status  Normocytic anemia, possibly anemia of chronic disease -Questionable cause.  Hemoglobin stable.  Monitor intermittently  Hypokalemia -Replace.  Repeat a.m.  labs  Hypomagnesemia -Replace.  Repeat a.m. labs  Hyponatremia -resolved  Physical deconditioning Goals of care -Overall condition looks guarded.  Palliative care consultation for goals of care discussion.  Currently listed as full code.  DVT prophylaxis: Lovenox Code Status: Full Family Communication: None at bedside Disposition Plan: Status is: Inpatient Remains inpatient appropriate because: Of severity of illness.  Still encephalopathic.    Consultants: Neurology.  Consult palliative care  Procedures: As above.  Antimicrobials: None   Subjective: Patient seen and examined at bedside.  Undergoing LTM EEG.  Awake, slow to respond, poor historian.  No overnight fever, vomiting, agitation reported.  Objective: Vitals:   03/08/22 2050 03/08/22 2330 03/09/22 0313 03/09/22 0733  BP: (!) 171/75 (!) 175/90 (!) 156/100 (!) 178/85  Pulse: 88 88 84 88  Resp: 12 13 16 16   Temp: 98.4 F (36.9 C) 98 F (36.7 C) 98 F (36.7 C) 97.9 F (36.6 C)  TempSrc:  Oral Oral Oral  SpO2: 99% 99% 100% 96%  Weight:      Height:        Intake/Output Summary (Last 24 hours) at 03/09/2022 1012 Last data filed at 03/09/2022 0300 Gross per 24 hour  Intake 3357.58 ml  Output 475 ml  Net 2882.58 ml   Filed Weights   03/05/22 1615  Weight: 50.8 kg    Examination:  General exam: Appears calm and comfortable.  Elderly female lying in bed.  Undergoing LTM EEG.  Currently on room air. Respiratory system: Bilateral decreased breath sounds at bases with scattered crackles Cardiovascular system: S1 & S2 heard, Rate controlled Gastrointestinal system: Abdomen is nondistended, soft and nontender. Normal bowel sounds heard. Extremities: No cyanosis, clubbing, edema  Central nervous system: Awake,  extremely slow to respond, slightly confused.  Poor historian.  No focal neurological deficits. Moving extremities Skin: No rashes, lesions or ulcers Psychiatry: Flat affect.  No signs of agitation.      Data Reviewed: I have personally reviewed following labs and imaging studies  CBC: Recent Labs  Lab 03/05/22 1525 03/05/22 1608 03/08/22 0355  WBC 6.6  --  8.0  NEUTROABS 5.2  --   --   HGB 9.1* 8.8* 9.8*  HCT 26.8* 26.0* 27.8*  MCV 92.1  --  89.1  PLT 204  --  186   Basic Metabolic Panel: Recent Labs  Lab 03/05/22 1525 03/05/22 1608 03/06/22 1114 03/07/22 0309 03/08/22 0355 03/09/22 0641  NA 141 141 138 134* 140 139  K 3.0* 3.0* 3.3* 3.5 3.4* 3.4*  CL 103 100 103 98 107 104  CO2 26  --  22 22 21* 16*  GLUCOSE 107* 101* 103* 96 77 148*  BUN 7* 6* 5* 6* 9 15  CREATININE 0.93 0.90 0.92 1.01* 0.98 1.07*  CALCIUM 8.7*  --  9.1 9.2 8.7* 8.9  MG  --   --  1.4* 2.0  --  1.4*  PHOS  --   --  2.9 3.1  --  4.2   GFR: Estimated Creatinine Clearance: 26.6 mL/min (A) (by C-G formula based on SCr of 1.07 mg/dL (H)). Liver Function Tests: Recent Labs  Lab 03/05/22 1525  AST 23  ALT 11  ALKPHOS 32*  BILITOT 0.8  PROT 6.5  ALBUMIN 3.4*   No results for input(s): "LIPASE", "AMYLASE" in the last 168 hours. No results for input(s): "AMMONIA" in the last 168 hours. Coagulation Profile: Recent Labs  Lab 03/05/22 1525  INR 1.2   Cardiac Enzymes: No results for input(s): "CKTOTAL", "CKMB", "CKMBINDEX", "TROPONINI" in the last 168 hours. BNP (last 3 results) No results for input(s): "PROBNP" in the last 8760 hours. HbA1C: No results for input(s): "HGBA1C" in the last 72 hours. CBG: Recent Labs  Lab 03/05/22 1526 03/06/22 1439 03/07/22 1706  GLUCAP 110* 112* 89   Lipid Profile: No results for input(s): "CHOL", "HDL", "LDLCALC", "TRIG", "CHOLHDL", "LDLDIRECT" in the last 72 hours. Thyroid Function Tests: Recent Labs    03/07/22 0309  TSH 1.467   Anemia Panel: Recent Labs    03/06/22 1114  VITAMINB12 386   Sepsis Labs: No results for input(s): "PROCALCITON", "LATICACIDVEN" in the last 168 hours.  Recent Results (from the past 240 hour(s))  CSF  culture w Gram Stain     Status: None (Preliminary result)   Collection Time: 03/07/22  3:12 PM   Specimen: PATH Cytology CSF; Cerebrospinal Fluid  Result Value Ref Range Status   Specimen Description CSF  Final   Special Requests NONE  Final   Gram Stain NO WBC SEEN NO ORGANISMS SEEN   Final   Culture   Final    NO GROWTH 2 DAYS Performed at [facility], [address] [place], [place] [address]    Report Status PENDING  Incomplete         Radiology Studies: MR BRAIN W WO CONTRAST  Result Date: 03/08/2022 CLINICAL DATA:  Persistent left gaze deviation. EXAM: MRI HEAD WITHOUT AND WITH CONTRAST TECHNIQUE: Multiplanar, multiecho pulse sequences of the brain and surrounding structures were obtained without and with intravenous contrast. CONTRAST:  39mL GADAVIST GADOBUTROL 1 MMOL/ML IV SOLN COMPARISON:  Noncontrast brain MRI 03/05/2022. FINDINGS: Brain: There is no acute intracranial hemorrhage, extra-axial fluid collection, or acute infarct.  There is unchanged parenchymal volume loss with prominence of the ventricular system and extra-axial CSF spaces. There is unchanged confluent FLAIR signal abnormality in the supratentorial brain likely reflecting sequela of chronic small-vessel ischemic change. There are unchanged small remote lacunar infarcts in the right cerebellar hemisphere and right parieto-occipital white matter. The pituitary and suprasellar region are normal. The corpus callosum is normal. There is no abnormal enhancement. There is no mass lesion. There is no mass effect or midline shift. Vascular: Normal flow voids. Skull and upper cervical spine: Normal marrow signal. Sinuses/Orbits: There is mucosal thickening in the left sphenoid sinus Bilateral lens implants are in place. The globes and orbits are otherwise unremarkable. Other: None. IMPRESSION: Stable brain MRI with no acute intracranial pathology or abnormal enhancement. Electronically Signed   By: Lesia Hausen M.D.    On: 03/08/2022 14:09   Overnight EEG with video  Result Date: 03/08/2022 Charlsie Quest, MD     03/09/2022  9:43 AM Patient Name: Rebekah Avila MRN: [id number] Epilepsy Attending: Charlsie Quest Referring Physician/Provider: Milon Dikes, MD  Duration: 03/07/2022 1837 to 03/08/2022 2000  Patient history: 88yo F with ams. EEG to evaluate for seizure  Level of alertness: Awake, asleep  AEDs during EEG study: None  Technical aspects: This EEG study was done with scalp electrodes positioned according to the 10-20 International system of electrode placement. Electrical activity was reviewed with band pass filter of 1-70Hz , sensitivity of 7 uV/mm, display speed of 51mm/sec with a 60Hz  notched filter applied as appropriate. EEG data were recorded continuously and digitally stored.  Video monitoring was available and reviewed as appropriate.  Description: The posterior dominant rhythm consists of 8Hz  activity of moderate voltage (25-35 uV) seen predominantly in posterior head regions, asymmetric ( left<right) and reactive to eye opening and eye closing. Sleep was characterized by vertex waves, sleep spindles (12 to 14 Hz), asymmetric ( left<right) maximal frontocentral region. EEG showed continuous 3 to 5 Hz 11 delta slowing in left temporal region. Hyperventilation and photic stimulation were not performed.   EEG was disconnected between 03/08/2022 1300 to 1427 for MRI  ABNORMALITY - Continuous slow, left temporal region - Background asymmetry, left<right - Spindles asymmetry, left<right  IMPRESSION: This study is suggestive of cortical dysfunction arising from left temporal region likely secondary to underlying structural abnormality, post-ictal state. No seizures or epileptiform discharges were seen throughout the recording.  Priyanka    DG FL GUIDED LUMBAR PUNCTURE  Result Date: 03/07/2022 CLINICAL DATA:  88 year old patient with acute onset of confusion, aphasia, and left gaze deviation. Brain  imaging is negative for stroke. Request for lumbar puncture. EXAM: LUMBAR PUNCTURE UNDER FLUOROSCOPY PROCEDURE: An appropriate skin entry site was determined fluoroscopically. Operator donned sterile gloves and mask. Skin site was marked, then prepped with Betadine, draped in usual sterile fashion, and infiltrated locally with 1% lidocaine. A 20 gauge spinal needle advanced at L4-L5 from a left and right interlaminar approach. Several attempts at advancing the needle into the thecal sac were unsuccessful. A 20 gauge spinal needle advanced at L3-L4 from a left and right interlaminar approach. After several attempts the needle, there was return of blood tinged CSF. Opening pressure measured at 9 cm water. Only 3 ml CSF was able to be collected and divided among 3 sterile vials for the requested laboratory studies. Low volume return was likely due to lower pressure. The needle was then removed. The patient tolerated the procedure well and there were no complications. FLUOROSCOPY: Radiation  Exposure Index (as provided by the fluoroscopic device): 5.9 mGy Kerma IMPRESSION: Technically difficult but successful lumbar puncture under fluoroscopy. Only 3 mm CSF was able to be collected. This was sent to the laboratory for analysis. This exam was performed by Corrin Parker, PA-C, and was supervised and interpreted by Caprice Renshaw, MD. Electronically Signed   By: Caprice Renshaw M.D.   On: 03/07/2022 15:29        Scheduled Meds:   stroke: early stages of recovery book   Does not apply Once   aspirin EC  81 mg Oral QPM   aspirin  300 mg Rectal Once   Chlorhexidine Gluconate Cloth  6 each Topical Daily   enoxaparin (LOVENOX) injection  30 mg Subcutaneous Q24H   LORazepam  0.5 mg Intravenous Once   pantoprazole (PROTONIX) IV  40 mg Intravenous QHS   sodium chloride flush  3 mL Intravenous Once   Continuous Infusions:  lactated ringers 125 mL/hr at 03/09/22 0825   methylPREDNISolone (SOLU-MEDROL) injection Stopped  (03/08/22 2305)          Glade Lloyd, MD [facility] 03/09/2022, 10:12 AM

## 2022-03-09 NOTE — Procedures (Signed)
Patient Name: Rebekah Avila  MRN: [id number]  Epilepsy Attending: Charlsie Quest  Referring Physician/Provider: Milon Dikes, MD   Duration: 03/08/2022 2000 to 03/09/2022 2000   Patient history: 88yo F with ams. EEG to evaluate for seizure   Level of alertness: Awake, asleep   AEDs during EEG study: None   Technical aspects: This EEG study was done with scalp electrodes positioned according to the 10-20 International system of electrode placement. Electrical activity was reviewed with band pass filter of 1-70Hz , sensitivity of 7 uV/mm, display speed of 71mm/sec with a 60Hz  notched filter applied as appropriate. EEG data were recorded continuously and digitally stored.  Video monitoring was available and reviewed as appropriate.   Description: The posterior dominant rhythm consists of 8Hz  activity of moderate voltage (25-35 uV) seen predominantly in posterior head regions, asymmetric ( left<right) and reactive to eye opening and eye closing. Sleep was characterized by vertex waves, sleep spindles (12 to 14 Hz), asymmetric ( left<right) maximal frontocentral region. EEG showed continuous 3 to 5 Hz 11 delta slowing in left temporal region. Hyperventilation and photic stimulation were not performed.     One seizure without clinical signs was noted on 03/09/2022 at 0902.  Patient was laying in bed, moving the patient tag on her right upper extremity.  Concomitant EEG initially showed 2 to 3 Hz rhythmic slowing in left temporal region admixed with 2 to 3 Hz sharp waves which gradually evolved into 4 to 5 Hz theta slowing and involving all of left hemisphere.  Duration of seizure was about 4.5 minutes.   ABNORMALITY -Seizure without clinical sign, left temporal region - Continuous slow, left temporal region - Background asymmetry, left<right - Spindles asymmetry, left<right   IMPRESSION: One seizure without clinical sign was noted during the study on 03/09/2022 at 0902 arising from left temporal  region, lasting for about 4.5 minutes.  Additionally the study was suggestive of cortical dysfunction arising from left temporal region likely secondary to underlying structural abnormality, post-ictal state.    Ralyn Stlaurent 03/11/2022

## 2022-03-09 NOTE — Progress Notes (Signed)
Neurology Progress Note  Brief HPI: 88 year old patient with history of hypertension, hyperlipidemia and diabetes was admitted yesterday with confusion, aphasia, right hemianopsia and left gaze deviation.  At baseline, she is normally alert and oriented with some memory loss.  Per her daughter she was at baseline normal mental status Friday evening.  CT head showed no acute abnormality, CTA with perfusion was performed and revealed no LVO and no perfusion deficit.  MRI shows no acute abnormality.  Although patient does not have a history of seizures, there is a question of seizure activity with prolonged postictal confusion.  LTM EEG shows left sided slowing but no seizure activity. LP under fluoro traumatic tap and only 3cc of CSF obtained. Negative meningitis/encephalitis biofire panel, prelim cultures negative, elevated protein but confounded by the fact that this was a truamtic tap with "red colored" csf and unable to count RBCs or WBCs 2/2 clots in the specimen. Autoimmune and paraneoplastic panel is pending.   S:// Had a single left temporal seizure lasting 4.5 mins. Gaze deviation has resolved. Still has significant aphasia.   O:// Current vital signs: BP (!) 178/85 (BP Location: Right Arm)   Pulse 88   Temp 97.9 F (36.6 C) (Oral)   Resp 16   Ht 5' (1.524 m)   Wt 50.8 kg   SpO2 96%   BMI 21.87 kg/m  Vital signs in last 24 hours: Temp:  [97.9 F (36.6 C)-99.5 F (37.5 C)] 97.9 F (36.6 C) (11/15 0733) Pulse Rate:  [84-88] 88 (11/15 0733) Resp:  [12-17] 16 (11/15 0733) BP: (155-178)/(75-100) 178/85 (11/15 0733) SpO2:  [96 %-100 %] 96 % (11/15 0733)  GENERAL: frail elderly female in NAD HEENT: - Normocephalic and atraumatic, moist mm Ext: warm, well perfused, intact peripheral pulses, no edema  NEURO:  Mental Status: she is awake, alert, has a social smile and makes good eye contact on left and right.  Language: Does not seem to follow commands. Intermittently repeats what I  say. Does seem to intermittently try to mimic. Does not answer any orientation questions. Clearly has receptive and expressive aphasia out of proportion to mentation. Cranial Nerves: PERRL, no gaze preference today.  Able to track both sides.  Blinks to threat on both sides, no facial asymmetry, facial sensation intact, hearing intact. Motor: moves all 4 extremities equally and spontaneously  Tone: is normal and bulk is normal Sensation- Intact to light touch bilaterally Reflexes- 2+ right bicep, others unable to elicit Coordination: Unable to assess  Gait- deferred    Medications  Current Facility-Administered Medications:     stroke: early stages of recovery book, , Does not apply, Once, Katrinka Blazing, Rondell A, MD   acetaminophen (TYLENOL) tablet 650 mg, 650 mg, Oral, Q4H PRN **OR** acetaminophen (TYLENOL) 160 MG/5ML solution 650 mg, 650 mg, Per Tube, Q4H PRN **OR** acetaminophen (TYLENOL) suppository 650 mg, 650 mg, Rectal, Q4H PRN, Katrinka Blazing, Rondell A, MD   aspirin EC tablet 81 mg, 81 mg, Oral, QPM, Smith, Rondell A, MD   aspirin suppository 300 mg, 300 mg, Rectal, Once, Smith, Rondell A, MD   Chlorhexidine Gluconate Cloth 2 % PADS 6 each, 6 each, Topical, Daily, Standley Brooking, MD   enoxaparin (LOVENOX) injection 30 mg, 30 mg, Subcutaneous, Q24H, Pham, Minh Q, RPH-CPP, 30 mg at 03/08/22 1823   hydrALAZINE (APRESOLINE) injection 10 mg, 10 mg, Intravenous, Q4H PRN, Clydie Braun, MD   lactated ringers infusion, , Intravenous, Continuous, Standley Brooking, MD, Last Rate: 125 mL/hr at 03/09/22 0825,  New Bag at 03/09/22 0825   LORazepam (ATIVAN) injection 0.5 mg, 0.5 mg, Intravenous, Once, Standley Brooking, MD   magnesium sulfate IVPB 2 g 50 mL, 2 g, Intravenous, Once, Alekh, Kshitiz, MD   methylPREDNISolone sodium succinate (SOLU-MEDROL) 1,000 mg in sodium chloride 0.9 % 50 mL IVPB, 1,000 mg, Intravenous, Q24H, de Saintclair Halsted, [place] E, NP, Stopped at 03/08/22 2305   pantoprazole (PROTONIX)  injection 40 mg, 40 mg, Intravenous, QHS, de Saintclair Halsted, [place] E, NP, 40 mg at 03/08/22 2154   sodium chloride flush (NS) 0.9 % injection 3 mL, 3 mL, Intravenous, Once, Rondel Baton, MD Labs CBC    Component Value Date/Time   WBC 8.0 03/08/2022 0355   RBC 3.12 (L) 03/08/2022 0355   HGB 9.8 (L) 03/08/2022 0355   HGB 11.0 (L) 11/16/2021 1214   HCT 27.8 (L) 03/08/2022 0355   HCT 32.4 (L) 11/16/2021 1214   PLT 186 03/08/2022 0355   PLT 228 11/16/2021 1214   MCV 89.1 03/08/2022 0355   MCV 88 11/16/2021 1214   MCH 31.4 03/08/2022 0355   MCHC 35.3 03/08/2022 0355   RDW 12.9 03/08/2022 0355   RDW 12.7 11/16/2021 1214   LYMPHSABS 0.8 03/05/2022 1525   MONOABS 0.6 03/05/2022 1525   EOSABS 0.0 03/05/2022 1525   BASOSABS 0.0 03/05/2022 1525    CMP     Component Value Date/Time   NA 139 03/09/2022 0641   NA 140 11/16/2021 1214   K 3.4 (L) 03/09/2022 0641   CL 104 03/09/2022 0641   CO2 16 (L) 03/09/2022 0641   GLUCOSE 148 (H) 03/09/2022 0641   BUN 15 03/09/2022 0641   BUN 36 (H) 11/16/2021 1214   CREATININE 1.07 (H) 03/09/2022 0641   CREATININE 0.91 03/01/2022 1136   CALCIUM 8.9 03/09/2022 0641   PROT 6.5 03/05/2022 1525   PROT 8.0 11/16/2021 1214   ALBUMIN 3.4 (L) 03/05/2022 1525   ALBUMIN 4.6 11/16/2021 1214   AST 23 03/05/2022 1525   ALT 11 03/05/2022 1525   ALKPHOS 32 (L) 03/05/2022 1525   BILITOT 0.8 03/05/2022 1525   BILITOT 0.7 11/16/2021 1214   GFRNONAA 50 (L) 03/09/2022 0641   GFRAA 62 12/17/2019 1008   Imaging I have reviewed images in epic and the results pertinent to this consultation are:  CT-scan of the brain 1. No evidence of acute intracranial abnormality.  ASPECTS is 10. 2. Chronic microvascular ischemic disease.  CTA head and neck with perf:  1. No emergent large vessel occlusion or proximal high-grade stenosis. 2. No evidence of core infarct or penumbra on CT perfusion. 3. Small (1-2 mm) outpouching arising from the inter communicating artery,  suspicious for aneurysm.  MRI brain 03/05/22: No acute abnormality.  Repeat MRI Brain 03/08/22: No acute abnormality.  LTM 11/13-11/14 This study is suggestive of cortical dysfunction arising from left temporal region likely secondary to underlying structural abnormality, post-ictal state. No seizures or epileptiform discharges were seen throughout the recording.  LTM 11/14-11/15: One seizure without clinical sign was noted during the study on 03/09/2022 at 0902 arising from left temporal region, lasting for about 4.5 minutes.  Additionally the study was suggestive of cortical dysfunction arising from left temporal region likely secondary to underlying structural abnormality, post-ictal state.     Labs: B1 pending  B12 386 RPR negative  TSH normal  Serum Autoimmune encephalitis panel negative.  Assessment:  88 year old patient with history of hypertension, hyperlipidemia and diabetes was admitted yesterday with confusion, aphasia, right hemianopsia  and left gaze deviation.  At baseline, she is normally alert and oriented with some memory loss.  Per her daughter she was at baseline normal mental status Friday evening.  She continues to remain nonverbal and cannot follow commands.  LP under fluoroscopy resulted in only a small amount of bloody CSF, but meningitis encephalitis panel was negative, and protein was 296. MRI Brain with no acute abnormalities, repeat MRI Brain again non revealing. LTM EEG with no seizures on the first day but notable for left hemispheric slowing, more so in the left temple. LTM EEG captured a focal subclinical left temporal seizure lasting 4.5 mins. Given the seizure today, likely etiology of the earlier noted aphasia and R hemianopsia was prolonged post ictal state.  Etiology of her seizures i unclear. No prior hx of seizures per daughter Dondra Spry. Differential includes seizures in the setting of dementia onset, possible autoimmune encephalitis.  Will continue  Solumedrol x 3 days with last dose tomorrow.  Recommendations: -Send out serum paraneoplastic encephalitis panel and autoimmune encephalitis panel-unable to obtain much CSF, even under fluoroscopy guidance. -Solu-Medrol 1 g daily x3 days after serum labs are sent out. Ends 11/17. - supplement B12 for > 400  - Keppra 1000mg  IV once, followed by Keppra 500mg  BID. - Discontinue LTM tomorrow AM around 0900 if no further seizures overnight. - Neurology will continue to follow   Plan discussed with Dr. and with patient's daughter over phone.  [facility] Pager Number [phone]

## 2022-03-09 NOTE — Progress Notes (Signed)
Patients appetite was good today, she ate 75% or her lunch, and 70% of dinner.

## 2022-03-09 NOTE — Progress Notes (Signed)
Speech Language Pathology Treatment: Dysphagia  Patient Details Name: Rebekah Avila MRN: [id number] DOB: Nov 18, 1931 Today's Date: 03/09/2022 Time: [id number] SLP Time Calculation (min) (ACUTE ONLY): 10 min  Assessment / Plan / Recommendation Clinical Impression  Patient seen by SLP for skilled treatment focused on dysphagia goals. Patient was awake, alert and cooperative. Although she was not able to follow any verbal commands, she was able to perform actions such as holding cup and taking sips of water, using spoon to eat applesauce, etc. No overt s/s aspiration or penetration with liquids or puree solids and SLP suspecting only a mild swallow initiation delay. She did exhibit instances of oral holding with liquids (suspected due to cognitive impairment) but no significant oral residuals post swallows. SLP is recommending initiate PO diet of Dys 1 (puree) solids and thin liquids with full supervision. SLP will follow for ability to upgrade solids but will need to determine if patient has dentures as she is edentulous.    HPI HPI: 88 yo female adm to [facility] with AMS= left gaze preference, aphasia- suspected seizure.  MRI showed Pt is s/p LP 11/13 with minimal fluid removed - and pt is currently on EEG - EEG showed "suggestive of cortical dysfunction arising from left temporal region likely secondary to underlying structural abnormality, post-ictal state. No seizures or epileptiform discharges were seen throughout the recording".  MRI 03/05/2022 . Small remote right cerebellar lacunar  infarcts. Moderate patchy T2/FLAIR hyperintensities within the white  matter, compatible with chronic microvascular ischemic disease.  Cerebral atrophy.   Speech and swallow evaluation ordered. Daughter present during session and advised that pt eats regular/thin diet at home and has no communication deficits. She tends to lay to the right side per daughter.  Patient has not been seen by SLP per chart review - due to her  AMS.      SLP Plan  Continue with current plan of care      Recommendations for follow up therapy are one component of a multi-disciplinary discharge planning process, led by the attending physician.  Recommendations may be updated based on patient status, additional functional criteria and insurance authorization.    Recommendations  Diet recommendations: Dysphagia 1 (puree);Thin liquid Liquids provided via: Cup Medication Administration: Whole meds with puree Supervision: Patient able to self feed;Full supervision/cueing for compensatory strategies;Staff to assist with self feeding Compensations: Slow rate;Small sips/bites;Minimize environmental distractions Postural Changes and/or Swallow Maneuvers: Seated upright 90 degrees                Oral Care Recommendations: Oral care BID;Staff/trained caregiver to provide oral care Follow Up Recommendations: Skilled nursing-short term rehab (<3 hours/day) Assistance recommended at discharge: Frequent or constant Supervision/Assistance SLP Visit Diagnosis: Dysphagia, unspecified (R13.10) Plan: Continue with current plan of care           Angela Nevin, MA, CCC-SLP Speech Therapy

## 2022-03-09 NOTE — Plan of Care (Signed)
PT NON VERBAL

## 2022-03-09 NOTE — TOC Progression Note (Signed)
Transition of Care [facility]) - Progression Note    Patient Details  Name: Rebekah Avila MRN: [id number] Date of Birth: 1931-08-22  Transition of Care [facility]) CM/SW Contact  Carley Hammed, [place] Phone Number: 03/09/2022, 11:39 AM  Clinical Narrative:    Pt's authorization is still pending at this time. Speech to re see to determine an appropriate diet. Facility continuing to follow for placement at discharge. TOC will continue to follow for DC needs.   Expected Discharge Plan: Skilled Nursing Facility Barriers to Discharge: English as a second language teacher, Continued Medical Work up, SNF Pending bed offer  Expected Discharge Plan and Services Expected Discharge Plan: Skilled Nursing Facility     Post Acute Care Choice: Skilled Nursing Facility Living arrangements for the past 2 months: Single Family Home                                       Social Determinants of Health (SDOH) Interventions    Readmission Risk Interventions     No data to display

## 2022-03-10 DIAGNOSIS — Z515 Encounter for palliative care: Secondary | ICD-10-CM

## 2022-03-10 DIAGNOSIS — R569 Unspecified convulsions: Secondary | ICD-10-CM | POA: Diagnosis not present

## 2022-03-10 DIAGNOSIS — G9340 Encephalopathy, unspecified: Secondary | ICD-10-CM | POA: Diagnosis not present

## 2022-03-10 DIAGNOSIS — Z66 Do not resuscitate: Secondary | ICD-10-CM | POA: Diagnosis not present

## 2022-03-10 DIAGNOSIS — Z7189 Other specified counseling: Secondary | ICD-10-CM

## 2022-03-10 DIAGNOSIS — R4182 Altered mental status, unspecified: Secondary | ICD-10-CM | POA: Diagnosis not present

## 2022-03-10 LAB — PARANEOPLASTIC AB
AGNA-1: NEGATIVE
Amphiphysin Antibody: NEGATIVE
Anti-Hu Ab: NEGATIVE
Anti-Ri Ab: NEGATIVE
Anti-Yo Ab: NEGATIVE
Antineruonal nuclear Ab Type 3: NEGATIVE
CASPR2 Antibody,Cell-based IFA: NEGATIVE
CRMP-5 IgG: NEGATIVE
Interpretation: NEGATIVE
LGI1 Antibody, Cell-based IFA: NEGATIVE
Purkinje Cell Cyto Ab Type 2: NEGATIVE
Purkinje Cell Cyto Ab Type Tr: NEGATIVE
VGCC Antibody: <1 pmol/L (ref 0.0–30.0)

## 2022-03-10 LAB — MAGNESIUM: Magnesium: 1.7 mg/dL (ref 1.7–2.4)

## 2022-03-10 LAB — BASIC METABOLIC PANEL
Anion gap: 13 (ref 5–15)
BUN: 14 mg/dL (ref 8–23)
CO2: 22 mmol/L (ref 22–32)
Calcium: 8.5 mg/dL — ABNORMAL LOW (ref 8.9–10.3)
Chloride: 105 mmol/L (ref 98–111)
Creatinine, Ser: 0.98 mg/dL (ref 0.44–1.00)
GFR, Estimated: 56 mL/min — ABNORMAL LOW (ref >60)
Glucose, Bld: 178 mg/dL — ABNORMAL HIGH (ref 70–99)
Potassium: 2.8 mmol/L — ABNORMAL LOW (ref 3.5–5.1)
Sodium: 140 mmol/L (ref 135–145)

## 2022-03-10 LAB — CSF CULTURE W GRAM STAIN
Culture: NO GROWTH
Gram Stain: NONE SEEN

## 2022-03-10 MED ORDER — POTASSIUM CHLORIDE IN NACL 40-0.9 MEQ/L-% IV SOLN
INTRAVENOUS | Status: DC
  Administered 2022-03-10: 50 mL/h via INTRAVENOUS
  Filled 2022-03-10 (×6): qty 1000

## 2022-03-10 MED ORDER — POTASSIUM CHLORIDE CRYS ER 20 MEQ PO TBCR
40.0000 meq | EXTENDED_RELEASE_TABLET | Freq: Once | ORAL | Status: AC
  Administered 2022-03-10: 40 meq via ORAL
  Filled 2022-03-10: qty 2

## 2022-03-10 MED ORDER — HALOPERIDOL LACTATE 5 MG/ML IJ SOLN
1.0000 mg | Freq: Four times a day (QID) | INTRAMUSCULAR | Status: DC | PRN
  Administered 2022-03-10 – 2022-03-11 (×2): 1 mg via INTRAMUSCULAR
  Filled 2022-03-10 (×2): qty 1

## 2022-03-10 MED ORDER — TRAZODONE HCL 50 MG PO TABS
25.0000 mg | ORAL_TABLET | Freq: Every evening | ORAL | Status: DC | PRN
  Administered 2022-03-10 – 2022-03-13 (×4): 25 mg via ORAL
  Filled 2022-03-10 (×4): qty 1

## 2022-03-10 NOTE — Progress Notes (Signed)
MB performed maintenance on C4, CZ, P4 electrodes. All impedances are below 10k ohms. No skin breakdown noted. MRI leads are intact, adjusted head Net wrap

## 2022-03-10 NOTE — Progress Notes (Signed)
PROGRESS NOTE    Rebekah Avila  BTD:[id number] DOB: Sep 27, 1931 DOA: 03/05/2022 PCP: Sharon Seller, NP   Brief Narrative:  88 year old female with history of diabetes mellitus type 2, hypertension, hyperlipidemia who apparently lives alone and performs all ADLs independently was found confused and aphasic by EMS with left gaze deviation and right hemianopsia along with word finding difficulties and confusion.  On presentation, CT head was negative for acute intracranial abnormality.  CTA of head and neck showed no LVO, no perfusion deficit.  Seen by neurology.  MRI of brain was negative for acute intracranial abnormality.  Assessment & Plan:   Acute encephalopathy suspected due to possible seizure, focal left hemispheric deficit Dysphagia -Imaging of the head and neck including CT of the head; CTA of head and neck; MRI brain has been unrevealing so far. -Concern for seizure: Initially started on Keppra but subsequently discontinued by neurology.  Neurology following.  EEG negative for seizure.  LTM EEG from 03/08/2022-03/09/2022 showed 1 seizure without clinical sign.  Neurology has restarted Keppra.   -Being treated with 1 g IV Solu-Medrol daily for 3 days as per neurology for possible autoimmune encephalitis.  Serum paraneoplastic encephalitis panel and autoimmune encephalitis panel are pending -Unable to obtain much CSF even under fluoroscopy guidance -Mental status has not improved much. -Diet as per SLP recommendations -PT/OT following and recommending SNF placement.  TOC following  Acute urinary retention -Foley catheter placed on 03/07/2022.  Start Flomax if able to tolerate orally  Essential hypertension -Blood pressure on the higher side.  Might have to start antihypertensives.  Diabetes mellitus type 2, controlled. -A1c 6.5 on 11/16/2021.  Not on any medications at home  Hyperlipidemia -On pravastatin daily Monday through Friday, skipping weekends.  Currently on hold  because of n.p.o. status  Normocytic anemia, possibly anemia of chronic disease -Questionable cause.  Hemoglobin stable.  Monitor intermittently  Hypokalemia -Replace.  Repeat a.m. labs  Hypomagnesemia -Improved  Hyponatremia -resolved  Physical deconditioning Goals of care -Overall condition looks guarded.  Palliative care consultation for goals of care discussion.  Currently listed as full code.  DVT prophylaxis: Lovenox Code Status: Full Family Communication: None at bedside Disposition Plan: Status is: Inpatient Remains inpatient appropriate because: Of severity of illness.  Still encephalopathic.    Consultants: Neurology.  palliative care  Procedures: As above.  Antimicrobials: None   Subjective: Patient seen and examined at bedside.  Poor historian.  Still very slow to respond.  No overnight seizures, vomiting or agitation reported. Objective: Vitals:   03/09/22 2025 03/10/22 0007 03/10/22 0401 03/10/22 0718  BP: (!) 174/78 (!) 182/75 (!) 142/62 (!) 170/83  Pulse: 89 95 88 (!) 56  Resp: 18 12 12 17   Temp:  97.7 F (36.5 C) 98.1 F (36.7 C) 98 F (36.7 C)  TempSrc:  Oral  Oral  SpO2: 100% 98% 100% 99%  Weight:      Height:        Intake/Output Summary (Last 24 hours) at 03/10/2022 0801 Last data filed at 03/10/2022 0526 Gross per 24 hour  Intake 1842.68 ml  Output 1500 ml  Net 342.68 ml    Filed Weights   03/05/22 1615  Weight: 50.8 kg    Examination:  General: On room air.  No distress.  Looks chronically ill and deconditioned. ENT/neck: No thyromegaly.  JVD is not elevated  respiratory: Decreased breath sounds at bases bilaterally with some crackles; no wheezing  CVS: S1-S2 heard, rate controlled currently Abdominal: Soft, nontender, slightly  distended; no organomegaly,  bowel sounds are heard Extremities: Trace lower extremity edema; no cyanosis  CNS: Awake, still very slow to respond, slightly confused.  Poor historian.  No focal  neurologic deficit.  Moves extremities Lymph: No obvious lymphadenopathy Skin: No obvious ecchymosis/lesions  psych: Not agitated currently.  Extremely flat affect Musculoskeletal: No obvious joint swelling/deformity     Data Reviewed: I have personally reviewed following labs and imaging studies  CBC: Recent Labs  Lab 03/05/22 1525 03/05/22 1608 03/08/22 0355  WBC 6.6  --  8.0  NEUTROABS 5.2  --   --   HGB 9.1* 8.8* 9.8*  HCT 26.8* 26.0* 27.8*  MCV 92.1  --  89.1  PLT 204  --  186    Basic Metabolic Panel: Recent Labs  Lab 03/06/22 1114 03/07/22 0309 03/08/22 0355 03/09/22 0641 03/10/22 0403  NA 138 134* 140 139 140  K 3.3* 3.5 3.4* 3.4* 2.8*  CL 103 98 107 104 105  CO2 22 22 21* 16* 22  GLUCOSE 103* 96 77 148* 178*  BUN 5* 6* 9 15 14   CREATININE 0.92 1.01* 0.98 1.07* 0.98  CALCIUM 9.1 9.2 8.7* 8.9 8.5*  MG 1.4* 2.0  --  1.4* 1.7  PHOS 2.9 3.1  --  4.2  --     GFR: Estimated Creatinine Clearance: 29.1 mL/min (by C-G formula based on SCr of 0.98 mg/dL). Liver Function Tests: Recent Labs  Lab 03/05/22 1525  AST 23  ALT 11  ALKPHOS 32*  BILITOT 0.8  PROT 6.5  ALBUMIN 3.4*    No results for input(s): "LIPASE", "AMYLASE" in the last 168 hours. No results for input(s): "AMMONIA" in the last 168 hours. Coagulation Profile: Recent Labs  Lab 03/05/22 1525  INR 1.2    Cardiac Enzymes: No results for input(s): "CKTOTAL", "CKMB", "CKMBINDEX", "TROPONINI" in the last 168 hours. BNP (last 3 results) No results for input(s): "PROBNP" in the last 8760 hours. HbA1C: No results for input(s): "HGBA1C" in the last 72 hours. CBG: Recent Labs  Lab 03/05/22 1526 03/06/22 1439 03/07/22 1706  GLUCAP 110* 112* 89    Lipid Profile: No results for input(s): "CHOL", "HDL", "LDLCALC", "TRIG", "CHOLHDL", "LDLDIRECT" in the last 72 hours. Thyroid Function Tests: No results for input(s): "TSH", "T4TOTAL", "FREET4", "T3FREE", "THYROIDAB" in the last 72  hours.  Anemia Panel: No results for input(s): "VITAMINB12", "FOLATE", "FERRITIN", "TIBC", "IRON", "RETICCTPCT" in the last 72 hours.  Sepsis Labs: No results for input(s): "PROCALCITON", "LATICACIDVEN" in the last 168 hours.  Recent Results (from the past 240 hour(s))  CSF culture w Gram Stain     Status: None (Preliminary result)   Collection Time: 03/07/22  3:12 PM   Specimen: PATH Cytology CSF; Cerebrospinal Fluid  Result Value Ref Range Status   Specimen Description CSF  Final   Special Requests NONE  Final   Gram Stain NO WBC SEEN NO ORGANISMS SEEN   Final   Culture   Final    NO GROWTH 2 DAYS Performed at [facility], [address], [place], [place]    Report Status PENDING  Incomplete         Radiology Studies: MR BRAIN W WO CONTRAST  Result Date: 03/08/2022 CLINICAL DATA:  Persistent left gaze deviation. EXAM: MRI HEAD WITHOUT AND WITH CONTRAST TECHNIQUE: Multiplanar, multiecho pulse sequences of the brain and surrounding structures were obtained without and with intravenous contrast. CONTRAST:  34mL GADAVIST GADOBUTROL 1 MMOL/ML IV SOLN COMPARISON:  Noncontrast brain  MRI 03/05/2022. FINDINGS: Brain: There is no acute intracranial hemorrhage, extra-axial fluid collection, or acute infarct. There is unchanged parenchymal volume loss with prominence of the ventricular system and extra-axial CSF spaces. There is unchanged confluent FLAIR signal abnormality in the supratentorial brain likely reflecting sequela of chronic small-vessel ischemic change. There are unchanged small remote lacunar infarcts in the right cerebellar hemisphere and right parieto-occipital white matter. The pituitary and suprasellar region are normal. The corpus callosum is normal. There is no abnormal enhancement. There is no mass lesion. There is no mass effect or midline shift. Vascular: Normal flow voids. Skull and upper cervical spine: Normal marrow signal. Sinuses/Orbits: There is  mucosal thickening in the left sphenoid sinus Bilateral lens implants are in place. The globes and orbits are otherwise unremarkable. Other: None. IMPRESSION: Stable brain MRI with no acute intracranial pathology or abnormal enhancement. Electronically Signed   By: Lesia Hausen M.D.   On: 03/08/2022 14:09   Overnight EEG with video  Result Date: 03/08/2022 Charlsie Quest, MD     03/09/2022  9:43 AM Patient Name: ANGENI CHAUDHURI MRN: [id number] Epilepsy Attending: Charlsie Quest Referring Physician/Provider: Milon Dikes, MD  Duration: 03/07/2022 1837 to 03/08/2022 2000  Patient history: 88yo F with ams. EEG to evaluate for seizure  Level of alertness: Awake, asleep  AEDs during EEG study: None  Technical aspects: This EEG study was done with scalp electrodes positioned according to the 10-20 International system of electrode placement. Electrical activity was reviewed with band pass filter of 1-70Hz , sensitivity of 7 uV/mm, display speed of 85mm/sec with a 60Hz  notched filter applied as appropriate. EEG data were recorded continuously and digitally stored.  Video monitoring was available and reviewed as appropriate.  Description: The posterior dominant rhythm consists of 8Hz  activity of moderate voltage (25-35 uV) seen predominantly in posterior head regions, asymmetric ( left<right) and reactive to eye opening and eye closing. Sleep was characterized by vertex waves, sleep spindles (12 to 14 Hz), asymmetric ( left<right) maximal frontocentral region. EEG showed continuous 3 to 5 Hz 11 delta slowing in left temporal region. Hyperventilation and photic stimulation were not performed.   EEG was disconnected between 03/08/2022 1300 to 1427 for MRI  ABNORMALITY - Continuous slow, left temporal region - Background asymmetry, left<right - Spindles asymmetry, left<right  IMPRESSION: This study is suggestive of cortical dysfunction arising from left temporal region likely secondary to underlying structural  abnormality, post-ictal state. No seizures or epileptiform discharges were seen throughout the recording.  Priyanka         Scheduled Meds:   stroke: early stages of recovery book   Does not apply Once   aspirin EC  81 mg Oral QPM   aspirin  300 mg Rectal Once   Chlorhexidine Gluconate Cloth  6 each Topical Daily   enoxaparin (LOVENOX) injection  30 mg Subcutaneous Q24H   levETIRAcetam  500 mg Oral BID   LORazepam  0.5 mg Intravenous Once   pantoprazole (PROTONIX) IV  40 mg Intravenous QHS   sodium chloride flush  3 mL Intravenous Once   tamsulosin  0.4 mg Oral QPC supper   Continuous Infusions:  lactated ringers 50 mL/hr at 03/09/22 2014   levETIRAcetam     methylPREDNISolone (SOLU-MEDROL) injection Stopped (03/09/22 2208)          03/11/22, MD [facility] 03/10/2022, 8:01 AM

## 2022-03-10 NOTE — Progress Notes (Signed)
Found patient with tele leads, and mitts removed. Compromised PIV dressing,   Patient was cleaned, repositioned and PIV dressing changed.

## 2022-03-10 NOTE — Progress Notes (Signed)
Neurology Progress Note  Brief HPI: 88 year old patient with history of hypertension, hyperlipidemia and diabetes was admitted yesterday with confusion, aphasia, right hemianopsia and left gaze deviation.  At baseline, she is normally alert and oriented with some memory loss.  Per her daughter she was at baseline normal mental status Friday evening.  CT head showed no acute abnormality, CTA with perfusion was performed and revealed no LVO and no perfusion deficit.  MRI shows no acute abnormality.  Although patient does not have a history of seizures, there is a question of seizure activity with prolonged postictal confusion.  LTM EEG shows left sided slowing but no seizure activity. LP under fluoro traumatic tap and only 3cc of CSF obtained. Negative meningitis/encephalitis biofire panel, prelim cultures negative, elevated protein but confounded by the fact that this was a truamtic tap with "red colored" csf and unable to count RBCs or WBCs 2/2 clots in the specimen. Autoimmune and paraneoplastic panel is unremarkable to date.  S:// Overnight EEG pending. No further gaze deviation noted on exam. Still has significant aphasia though she is able to greet examiner upon entering the room and state her name correctly.   O:// Current vital signs: BP (!) 170/83 (BP Location: Right Arm)   Pulse (!) 56   Temp 98 F (36.7 C) (Oral)   Resp 17   Ht 5' (1.524 m)   Wt 50.8 kg   SpO2 99%   BMI 21.87 kg/m  Vital signs in last 24 hours: Temp:  [97 F (36.1 C)-98.3 F (36.8 C)] 98 F (36.7 C) (11/16 0718) Pulse Rate:  [56-95] 56 (11/16 0718) Resp:  [12-20] 17 (11/16 0718) BP: (142-182)/(56-90) 170/83 (11/16 0718) SpO2:  [98 %-100 %] 99 % (11/16 0718)  GENERAL: frail elderly female in NAD HEENT: - Normocephalic and atraumatic, moist mm Ext: Warm, well perfused, intact peripheral pulses, no edema  NEURO:  Mental Status: she is awake, alert, has a social smile and makes good eye contact on left and  right.  Language: Follows simple commands without difficulty. Answers her name with some hesitancy when stating her last name (initially sounds like she is providing her middle/maiden name and then corrects herself). She is unable to state her age, month, or location. She does perseverate on "I am going to be okay" and speaks about "my daughter called" with some unintelligible speech. Cranial Nerves: PERRL, no gaze preference today.  Able to track both sides.  Blinks to threat on both sides, no facial asymmetry, facial sensation intact, hearing intact. Motor: moves all 4 extremities antigravity without vertical drift or asymmetry Tone: is normal and bulk is normal Sensation- Intact to light touch bilaterally Coordination: Does not perform  Gait- deferred  More verbal than yesterday.  Medications  Current Facility-Administered Medications:     stroke: early stages of recovery book, , Does not apply, Once, Smith, Rondell A, MD   0.9 % NaCl with KCl 40 mEq / L  infusion, , Intravenous, Continuous, Alekh, Kshitiz, MD   acetaminophen (TYLENOL) tablet 650 mg, 650 mg, Oral, Q4H PRN **OR** acetaminophen (TYLENOL) 160 MG/5ML solution 650 mg, 650 mg, Per Tube, Q4H PRN **OR** acetaminophen (TYLENOL) suppository 650 mg, 650 mg, Rectal, Q4H PRN, Katrinka Blazing, Rondell A, MD   aspirin EC tablet 81 mg, 81 mg, Oral, QPM, Smith, Rondell A, MD, 81 mg at 03/09/22 1925   aspirin suppository 300 mg, 300 mg, Rectal, Once, Katrinka Blazing, Rondell A, MD   Chlorhexidine Gluconate Cloth 2 % PADS 6 each, 6 each, Topical,  Daily, Standley Brooking, MD, 6 each at 03/09/22 1117   enoxaparin (LOVENOX) injection 30 mg, 30 mg, Subcutaneous, Q24H, Pham, Minh Q, RPH-CPP, 30 mg at 03/09/22 1924   haloperidol lactate (HALDOL) injection 1 mg, 1 mg, Intramuscular, Q6H PRN, Mansy, Jan A, MD, 1 mg at 03/10/22 0102   hydrALAZINE (APRESOLINE) injection 10 mg, 10 mg, Intravenous, Q4H PRN, Clydie Braun, MD   levETIRAcetam (KEPPRA) tablet 500 mg, 500  mg, Oral, BID, 500 mg at 03/09/22 2204 **OR** levETIRAcetam (KEPPRA) IVPB 500 mg/100 mL premix, 500 mg, Intravenous, BID, Erick Blinks, MD   LORazepam (ATIVAN) injection 0.5 mg, 0.5 mg, Intravenous, Once, Standley Brooking, MD   methylPREDNISolone sodium succinate (SOLU-MEDROL) 1,000 mg in sodium chloride 0.9 % 50 mL IVPB, 1,000 mg, Intravenous, Q24H, de Saintclair Halsted, [place] E, NP, Stopped at 03/09/22 2208   pantoprazole (PROTONIX) injection 40 mg, 40 mg, Intravenous, QHS, de Saintclair Halsted, [place] E, NP, 40 mg at 03/09/22 2207   potassium chloride SA (KLOR-CON M) CR tablet 40 mEq, 40 mEq, Oral, Once, Alekh, Kshitiz, MD   sodium chloride flush (NS) 0.9 % injection 3 mL, 3 mL, Intravenous, Once, Rondel Baton, MD   tamsulosin [facility]) capsule 0.4 mg, 0.4 mg, Oral, QPC supper, Alekh, Kshitiz, MD, 0.4 mg at 03/09/22 1925   traZODone (DESYREL) tablet 25 mg, 25 mg, Oral, QHS PRN, Mansy, Jan A, MD, 25 mg at 03/10/22 0100  Labs CBC    Component Value Date/Time   WBC 8.0 03/08/2022 0355   RBC 3.12 (L) 03/08/2022 0355   HGB 9.8 (L) 03/08/2022 0355   HGB 11.0 (L) 11/16/2021 1214   HCT 27.8 (L) 03/08/2022 0355   HCT 32.4 (L) 11/16/2021 1214   PLT 186 03/08/2022 0355   PLT 228 11/16/2021 1214   MCV 89.1 03/08/2022 0355   MCV 88 11/16/2021 1214   MCH 31.4 03/08/2022 0355   MCHC 35.3 03/08/2022 0355   RDW 12.9 03/08/2022 0355   RDW 12.7 11/16/2021 1214   LYMPHSABS 0.8 03/05/2022 1525   MONOABS 0.6 03/05/2022 1525   EOSABS 0.0 03/05/2022 1525   BASOSABS 0.0 03/05/2022 1525   CMP     Component Value Date/Time   NA 140 03/10/2022 0403   NA 140 11/16/2021 1214   K 2.8 (L) 03/10/2022 0403   CL 105 03/10/2022 0403   CO2 22 03/10/2022 0403   GLUCOSE 178 (H) 03/10/2022 0403   BUN 14 03/10/2022 0403   BUN 36 (H) 11/16/2021 1214   CREATININE 0.98 03/10/2022 0403   CREATININE 0.91 03/01/2022 1136   CALCIUM 8.5 (L) 03/10/2022 0403   PROT 6.5 03/05/2022 1525   PROT 8.0 11/16/2021 1214    ALBUMIN 3.4 (L) 03/05/2022 1525   ALBUMIN 4.6 11/16/2021 1214   AST 23 03/05/2022 1525   ALT 11 03/05/2022 1525   ALKPHOS 32 (L) 03/05/2022 1525   BILITOT 0.8 03/05/2022 1525   BILITOT 0.7 11/16/2021 1214   GFRNONAA 56 (L) 03/10/2022 0403   GFRAA 62 12/17/2019 1008   Imaging I have reviewed images in epic and the results pertinent to this consultation are:  CT-scan of the brain 1. No evidence of acute intracranial abnormality.  ASPECTS is 10. 2. Chronic microvascular ischemic disease.  CTA head and neck with perf:  1. No emergent large vessel occlusion or proximal high-grade stenosis. 2. No evidence of core infarct or penumbra on CT perfusion. 3. Small (1-2 mm) outpouching arising from the inter communicating artery, suspicious for aneurysm.  MRI  brain 03/05/22: No acute abnormality.  Repeat MRI Brain 03/08/22: No acute abnormality.  LTM 11/13-11/14 This study is suggestive of cortical dysfunction arising from left temporal region likely secondary to underlying structural abnormality, post-ictal state. No seizures or epileptiform discharges were seen throughout the recording.  LTM 11/14-11/15: One seizure without clinical sign was noted during the study on 03/09/2022 at 0902 arising from left temporal region, lasting for about 4.5 minutes.  Additionally the study was suggestive of cortical dysfunction arising from left temporal region likely secondary to underlying structural abnormality, post-ictal state.     Labs: B1 96.1 B12 386 RPR negative  TSH normal  Serum Autoimmune encephalitis panel negative.  Results for orders placed or performed in visit on 03/07/22  CSF culture w Gram Stain     Status: None (Preliminary result)   Collection Time: 03/07/22  3:12 PM   Specimen: PATH Cytology CSF; Cerebrospinal Fluid  Result Value Ref Range Status   Specimen Description CSF  Final   Special Requests NONE  Final   Gram Stain NO WBC SEEN NO ORGANISMS SEEN   Final   Culture    Final    NO GROWTH 2 DAYS Performed at [facility], [address] [place], [place] [address]    Report Status PENDING  Incomplete   Assessment:  88 year old patient with history of hypertension, hyperlipidemia and diabetes was admitted yesterday with confusion, aphasia, right hemianopsia and left gaze deviation.  At baseline, she is normally alert and oriented with some memory loss.  Per her daughter she was at baseline normal mental status Friday evening.  She has had improvement in neurologic exam as she is starting to speak with ongoing expressive aphasia but follows simple commands without difficulty.  LP under fluoroscopy resulted in only a small amount of bloody CSF, but meningitis encephalitis panel was negative, and protein was 296. MRI Brain with no acute abnormalities, repeat MRI Brain again non revealing. LTM EEG with no seizures on the first day but notable for left hemispheric slowing, more so in the left temple. LTM EEG captured a focal subclinical left temporal seizure lasting 4.5 mins. Given the seizure 11/15, likely etiology of the earlier noted aphasia and R hemianopsia was prolonged post ictal state.  Etiology of her seizures is unclear. No prior hx of seizures per daughter Dondra Spry. Differential includes seizures in the setting of dementia onset, possible autoimmune encephalitis, though serum and CSF work ups are unremarkable.   Will continue Solumedrol x 3 days with last dose today 11/16.  Recommendations: -Serum paraneoplastic encephalitis panel and autoimmune encephalitis panel-unable to obtain much CSF, even under fluoroscopy guidance. -Solu-Medrol 1 g daily x3 days after serum labs are sent out. Dose 3/3 planned to be complete today -Can consider an extra 2 days of steroids for a total of 5 days if patient has not returned to baseline - Supplement B12 for > 400  - Keppra 1000mg  IV once, followed by Keppra 500mg  BID. -Overnight LTM EEG with left-sided dysfunction but  exam is improving.  No seizures were identified. I will discontinue the LTM. - Neurology will continue to follow   , AGACNP-BC [facility] Pager: [phone]  Attending Neurohospitalist Addendum Patient seen and examined with APP/Resident. Agree with the history and physical as documented above. Agree with the plan as documented, which I helped formulate. I have independently reviewed the chart, obtained history, review of systems and examined the patient.I have personally reviewed pertinent head/neck/spine imaging (CT/MRI). Please feel free  to call with any questions.  -- Amie Portland, MD Neurologist [facility] Pager: [phone]

## 2022-03-10 NOTE — Plan of Care (Signed)
Pt more awake, but a bit restless and pleasantly confused

## 2022-03-10 NOTE — Progress Notes (Signed)
Occupational Therapy Treatment Patient Details Name: Rebekah Avila MRN: [id number] DOB: 08/07/1931 Today's Date: 03/10/2022   History of present illness Rebekah Avila is a 88 y.o. female who presented as code stroke with confusion, L gaze, and difficulty speaking. Stroke workup was negative. Pt with history of hypertension, hyperlipidemia and diabetes   OT comments  Pt with good progression towards established OT goals. Pt conversational rthis session, however, decreased orientation, awareness, and problem solving. Pt additionally with decreased strength and balance. Pt performing basic transfers and side steps toward [facility] with min-mod A. Performing BUE shoulder flexion 10x sitting EOB with multimodal cues and min guard A for balance as noted with mild posterior lean. Recommending discharge to SNF for continued OT services.    Recommendations for follow up therapy are one component of a multi-disciplinary discharge planning process, led by the attending physician.  Recommendations may be updated based on patient status, additional functional criteria and insurance authorization.    Follow Up Recommendations  Skilled nursing-short term rehab (<3 hours/day)     Assistance Recommended at Discharge Frequent or constant Supervision/Assistance  Patient can return home with the following  Assistance with cooking/housework;Direct supervision/assist for medications management;Direct supervision/assist for financial management;Assist for transportation;Help with stairs or ramp for entrance;A lot of help with walking and/or transfers;A little help with bathing/dressing/bathroom   Equipment Recommendations  Other (comment) (TBD)    Recommendations for Other Services      Precautions / Restrictions Precautions Precautions: Fall Precaution Comments: continuous EEG Restrictions Weight Bearing Restrictions: No       Mobility Bed Mobility Overal bed mobility: Needs Assistance Bed Mobility:  Supine to Sit, Sit to Supine     Supine to sit: Mod assist Sit to supine: Mod assist   General bed mobility comments: Mod A to elevate trunk. Mod A to bring BLE into bed. Pt sequencing task herself    Transfers Overall transfer level: Needs assistance Equipment used: Rolling walker (2 wheels) Transfers: Sit to/from Stand Sit to Stand: Mod assist           General transfer comment: Mod A for initial rise     Balance Overall balance assessment: Needs assistance Sitting-balance support: Feet supported Sitting balance-Leahy Scale: Fair Sitting balance - Comments: sitting on EOB with feet supported, no LOB and pt not needing UE support   Standing balance support: Bilateral upper extremity supported, During functional activity Standing balance-Leahy Scale: Poor Standing balance comment: Min A for standing balance                           ADL either performed or assessed with clinical judgement   ADL Overall ADL's : Needs assistance/impaired Eating/Feeding: Minimal assistance;Cueing for sequencing;Bed level Eating/Feeding Details (indicate cue type and reason): daughter, gail present and assisting on departure. RN notified of eating status due to removal of mitt restraints to self feed and daighter in room. Grooming: Therapist, nutritional;Set up;Sitting;Min guard Grooming Details (indicate cue type and reason): Supervision for safety with static sitting. Intermittent min guard A                 Toilet Transfer: Cueing for sequencing;Rolling walker (2 wheels);Moderate assistance Toilet Transfer Details (indicate cue type and reason): Taking side steps toward [facility] with min-mod A. Mod multimodal cues for sequecing Toileting- Clothing Manipulation and Hygiene: Total assistance;Sit to/from stand Toileting - Clothing Manipulation Details (indicate cue type and reason): for posterior pericare     Functional mobility  during ADLs: Minimal assistance;Moderate assistance;Rolling  walker (2 wheels) General ADL Comments: Min-light mod A to take steps toward [facility]    Extremity/Trunk Assessment Upper Extremity Assessment Upper Extremity Assessment: Generalized weakness   Lower Extremity Assessment Lower Extremity Assessment: Generalized weakness        Vision   Vision Assessment?: Vision impaired- to be further tested in functional context Additional Comments: Good eye contact and visually tracking therapist. Attempting to read sign on wall "call don't fall", but decreased visual acuity   Perception Perception Perception: Not tested   Praxis      Cognition Arousal/Alertness: Awake/alert Behavior During Therapy: Flat affect Overall Cognitive Status: Impaired/Different from baseline Area of Impairment: Attention, Following commands, Safety/judgement, Awareness, Problem solving, Orientation, Memory                 Orientation Level: Place, Time, Situation Current Attention Level: Focused Memory: Decreased short-term memory Following Commands: Follows one step commands consistently, Follows one step commands with increased time, Follows multi-step commands inconsistently Safety/Judgement: Decreased awareness of safety, Decreased awareness of deficits Awareness: Intellectual Problem Solving: Slow processing General Comments: Pt following all simple commands. Decreased STM to recall day, location after being oriented at beginning of session. Pt required Max indirect cues for attention and problem solving to count backward from 5 to 1. very pleasant        Exercises      Shoulder Instructions       General Comments Daughter present and supportive    Pertinent Vitals/ Pain       Pain Assessment Pain Assessment: Faces Faces Pain Scale: No hurt Pain Intervention(s): Monitored during session  Home Living                                          Prior Functioning/Environment              Frequency  Min 2X/week         Progress Toward Goals  OT Goals(current goals can now be found in the care plan section)  Progress towards OT goals: Progressing toward goals  Acute Rehab OT Goals Patient Stated Goal: eat OT Goal Formulation: With patient/family Time For Goal Achievement: 03/20/22 Potential to Achieve Goals: Good ADL Goals Pt Will Perform Grooming: with min assist;sitting Pt Will Perform Lower Body Dressing: with mod assist;sit to/from stand Pt Will Transfer to Toilet: with mod assist;stand pivot transfer;bedside commode Additional ADL Goal #1: pt will indep follow 100% of simple 1 step commands during sesssion  Plan Discharge plan remains appropriate    Co-evaluation                 AM-PAC OT "6 Clicks" Daily Activity     Outcome Measure   Help from another person eating meals?: A Little Help from another person taking care of personal grooming?: A Little Help from another person toileting, which includes using toliet, bedpan, or urinal?: A Lot Help from another person bathing (including washing, rinsing, drying)?: A Little Help from another person to put on and taking off regular upper body clothing?: A Little Help from another person to put on and taking off regular lower body clothing?: A Lot 6 Click Score: 16    End of Session Equipment Utilized During Treatment: Gait belt;Rolling walker (2 wheels)  OT Visit Diagnosis: Unsteadiness on feet (R26.81);Other abnormalities of gait and mobility (R26.89);Muscle weakness (generalized) (M62.81)  Activity Tolerance Patient tolerated treatment well   Patient Left in bed;with call bell/phone within reach;with family/visitor present;with bed alarm set   Nurse Communication Mobility status;Other (comment) (restraints not applied due to pt eating; daughter present and providing supervision)        Time: 2353-6144 OT Time Calculation (min): 20 min  Charges: OT General Charges $OT Visit: 1 Visit OT Treatments $Self Care/Home  Management : 8-22 mins  Rebekah Avila, Rebekah Avila [facility] Acute Rehabilitation Office: [phone]   Rebekah Avila 03/10/2022, 3:12 PM

## 2022-03-10 NOTE — Procedures (Addendum)
Patient Name: Rebekah Avila  MRN: [id number]  Epilepsy Attending: Charlsie Quest  Referring Physician/Provider: Milon Dikes, MD   Duration: 03/09/2022 2000 to 03/10/2022 1518   Patient history: 88yo F with ams. EEG to evaluate for seizure   Level of alertness: Awake, asleep   AEDs during EEG study: LEV   Technical aspects: This EEG study was done with scalp electrodes positioned according to the 10-20 International system of electrode placement. Electrical activity was reviewed with band pass filter of 1-70Hz , sensitivity of 7 uV/mm, display speed of 41mm/sec with a 60Hz  notched filter applied as appropriate. EEG data were recorded continuously and digitally stored.  Video monitoring was available and reviewed as appropriate.   Description: During awake state, no clear posterior dominant rhythm was seen. Sleep was characterized by sleep spindles (12-14hz ), maximal frontocentral region. EEG showed continuous generalized and maximal left temporal region 3 to 5 Hz theta- delta slowing. Hyperventilation and photic stimulation were not performed.      ABNORMALITY - Continuous slow, generalized and maximal left temporal region  IMPRESSION: This study was suggestive of cortical dysfunction arising from left temporal region likely secondary to underlying structural abnormality, post-ictal state.  Additionally there is moderate decrease encephalopathy, nonspecific etiology.  No definite seizures were seen during the study.    Mahsa Hanser 

## 2022-03-10 NOTE — Consult Note (Signed)
Consultation Note Date: 03/10/2022   Patient Name: Rebekah Avila  DOB: 04-07-1932  MRN: [id number]  Age / Sex: 88 y.o., female  PCP: Rebekah Chandler, NP Referring Physician: Aline August, MD  Reason for Consultation: Establishing goals of care  HPI/Patient Profile: 88 y.o. female  with past medical history of  diabetes mellitus type 2, hypertension, and hyperlipidemia admitted on 03/05/2022 with AMS.  Imaging negative.  EEG revealed seizure.  Patient has been started on AEDs.  Mental status not at baseline.  PMT consulted to discuss goals of care.  Clinical Assessment and Goals of Care: I have reviewed medical records including EPIC notes, labs and imaging, received report from RN, assessed the patient and then met with patient's daughter Rebekah Avila to discuss diagnosis prognosis, [place], EOL wishes, disposition and options.  I introduced Palliative Medicine as specialized medical care for people living with serious illness. It focuses on providing relief from the symptoms and stress of a serious illness. The goal is to improve quality of life for both the patient and the family.  Rebekah Avila shares that prior to admission patient was fully functional.  She lives alone and could independently care for herself.  She does share of some memory loss, she believes due to age-related changes.  Patient has never been diagnosed with dementia.  She also reports some complications after patient was started on metformin in July but this was not discussed in detail.  She tells me prior to admission patient did have a poor appetite.   We discussed patient's current illness and what it means in the larger context of patient's on-going co-morbidities.  Natural disease trajectory and expectations at EOL were discussed.  We discussed patient's seizures and ongoing altered mental status.  We discussed concern outcome.  I attempted to elicit values and goals of care important to  the patient.    The difference between aggressive medical intervention and comfort care was considered in light of the patient's goals of care.   Encouraged family to consider DNR/DNI status understanding evidenced based poor outcomes in similar hospitalized patients, as the cause of the arrest is likely associated with chronic/terminal disease rather than a reversible acute cardio-pulmonary event.  Daughter agrees to DNR/DNI status and tells me this is in line with patient's previously stated goals.  Discussed with daughter the importance of continued conversation with family and the medical providers regarding overall plan of care and treatment options, ensuring decisions are within the context of the patients values and GOCs.    We discussed allowing time for outcomes.  Questions and concerns were addressed. The family was encouraged to call with questions or concerns.  Primary Decision Maker NEXT OF KIN -daughter Rebekah Avila    SUMMARY OF RECOMMENDATIONS   CODE STATUS changed to DNR/DNI Continue all other interventions, family hopeful for recovery and open to discharge to rehab facility PMT will follow  Code Status/Advance Care Planning: DNR       Primary Diagnoses: Present on Admission:  Acute encephalopathy  Hypokalemia  HTN (hypertension)  Hyperlipidemia   I have reviewed the medical record, interviewed the patient and family, and examined the patient. The following aspects are pertinent.  Past Medical History:  Diagnosis Date   Diabetes ([place])    High cholesterol    Hypertension    Polymyalgia rheumatica (HCC)    Renal disorder    Social History   Socioeconomic History   Marital status: Widowed    Spouse name: Not on file   Number of  children: Not on file   Years of education: Not on file   Highest education level: Not on file  Occupational History   Occupation: retired  Tobacco Use   Smoking status: Never   Smokeless tobacco: Never  Vaping Use   Vaping  Use: Never used  Substance and Sexual Activity   Alcohol use: No   Drug use: No   Sexual activity: Not Currently  Other Topics Concern   Not on file  Social History Narrative   She lives alone, does her own house work. Daughter lives in [place].    Social Determinants of Health   Financial Resource Strain: Low Risk  (05/12/2021)   Overall Financial Resource Strain (CARDIA)    Difficulty of Paying Living Expenses: Not hard at all  Food Insecurity: No Food Insecurity (05/12/2021)   Hunger Vital Sign    Worried About Running Out of Food in the Last Year: Never true    Ran Out of Food in the Last Year: Never true  Transportation Needs: No Transportation Needs (05/12/2021)   PRAPARE - Hydrologist (Medical): No    Lack of Transportation (Non-Medical): No  Physical Activity: Insufficiently Active (05/12/2021)   Exercise Vital Sign    Days of Exercise per Week: 7 days    Minutes of Exercise per Session: 20 min  Stress: No Stress Concern Present (05/12/2021)   [place]    Feeling of Stress : Not at all  Social Connections: Not on file   Family History  Problem Relation Age of Onset   Healthy Mother    Heart disease Father    Heart attack Father    Scheduled Meds:   stroke: early stages of recovery book   Does not apply Once   aspirin EC  81 mg Oral QPM   aspirin  300 mg Rectal Once   Chlorhexidine Gluconate Cloth  6 each Topical Daily   enoxaparin (LOVENOX) injection  30 mg Subcutaneous Q24H   levETIRAcetam  500 mg Oral BID   LORazepam  0.5 mg Intravenous Once   pantoprazole (PROTONIX) IV  40 mg Intravenous QHS   sodium chloride flush  3 mL Intravenous Once   tamsulosin  0.4 mg Oral QPC supper   Continuous Infusions:  0.9 % NaCl with KCl 40 mEq / L 50 mL/hr at 03/10/22 0916   levETIRAcetam     PRN Meds:.acetaminophen **OR** acetaminophen (TYLENOL) oral liquid 160 mg/5 mL **OR**  acetaminophen, haloperidol lactate, hydrALAZINE, traZODone Allergies  Allergen Reactions   Morphine And Related Rash and Other (See Comments)    Syncope, also   Review of Systems  Unable to perform ROS: Mental status change    Physical Exam Constitutional:      General: She is not in acute distress.    Comments: Able to tell me name but unable to answer any other orientation questions  Pulmonary:     Effort: Pulmonary effort is normal.  Skin:    General: Skin is warm and dry.  Neurological:     Mental Status: She is alert.     Vital Signs: BP (!) 170/64 (BP Location: Right Arm)   Pulse 98   Temp 97.8 F (36.6 C) (Oral)   Resp 15   Ht 5' (1.524 m)   Wt 50.8 kg   SpO2 100%   BMI 21.87 kg/m  Pain Scale: 0-10   Pain Score: 0-No pain   SpO2: SpO2: 100 %  O2 Device:SpO2: 100 % O2 Flow Rate: .O2 Flow Rate (L/min): 0 L/min  IO: Intake/output summary:  Intake/Output Summary (Last 24 hours) at 03/10/2022 1451 Last data filed at 03/10/2022 1030 Gross per 24 hour  Intake 1842.68 ml  Output 1350 ml  Net 492.68 ml    LBM: Last BM Date : 03/09/22 Baseline Weight: Weight: 50.8 kg Most recent weight: Weight: 50.8 kg     Palliative Assessment/Data: PPS 40%     *Please note that this is a verbal dictation therefore any spelling or grammatical errors are due to the "[place] [facility]" system interpretation.   Juel Burrow, DNP, AGNP-C Palliative Medicine Team [phone] Pager: [phone]

## 2022-03-10 NOTE — Progress Notes (Signed)
LTM EEG discontinued - no skin breakdown at unhook.   

## 2022-03-10 NOTE — Progress Notes (Signed)
   03/10/22 0045  Provider Notification  Provider Name/Title Dr Andrez Grime  Date Provider Notified 03/10/22  Time Provider Notified 0045  Method of Notification Page  Notification Reason Other (Comment) (pt observed to restless and agitated)  Provider response See new orders  Date of Provider Response 03/10/22  Time of Provider Response [phone]

## 2022-03-10 NOTE — Progress Notes (Signed)
Speech Language Pathology Treatment: Dysphagia  Patient Details Name: Rebekah Avila MRN: [id number] DOB: Aug 05, 1931 Today's Date: 03/10/2022 Time: 0177-9390 SLP Time Calculation (min) (ACUTE ONLY): 15 min  Assessment / Plan / Recommendation Clinical Impression  Pt was seen with breakfast tray. Cognitive-linguistic status remains altered with mild impact on oral phase with current textures. Oral phase is fairly prolonged with purees but she clears her mouth pretty well. Pt has throat clearing and immediate coughing noted intermittently with thin liquids when administered via straw. Occasional throat clear is observed across purees as well but no overt coughing is removed with either consistency once straw is removed. Recommend continuing with Dys 1 (puree) diet and thin liquids by cup only. Would offer supervision and assistance during meals in an attempt to further increase safety and adherence to aspiration precautions.    HPI HPI: 88 yo female adm to [facility] with AMS= left gaze preference, aphasia- suspected seizure.  MRI showed Pt is s/p LP 11/13 with minimal fluid removed - and pt is currently on EEG - EEG showed "suggestive of cortical dysfunction arising from left temporal region likely secondary to underlying structural abnormality, post-ictal state. No seizures or epileptiform discharges were seen throughout the recording".  MRI 03/05/2022 . Small remote right cerebellar lacunar  infarcts. Moderate patchy T2/FLAIR hyperintensities within the white  matter, compatible with chronic microvascular ischemic disease.  Cerebral atrophy.   Speech and swallow evaluation ordered. Daughter present during session and advised that pt eats regular/thin diet at home and has no communication deficits. She tends to lay to the right side per daughter.  Patient has not been seen by SLP per chart review - due to her AMS.      SLP Plan  Continue with current plan of care      Recommendations for follow up therapy  are one component of a multi-disciplinary discharge planning process, led by the attending physician.  Recommendations may be updated based on patient status, additional functional criteria and insurance authorization.    Recommendations  Diet recommendations: Dysphagia 1 (puree);Thin liquid Liquids provided via: Cup;No straw Medication Administration: Whole meds with puree Supervision: Patient able to self feed;Full supervision/cueing for compensatory strategies;Staff to assist with self feeding Compensations: Slow rate;Small sips/bites;Minimize environmental distractions Postural Changes and/or Swallow Maneuvers: Seated upright 90 degrees                Oral Care Recommendations: Oral care BID;Staff/trained caregiver to provide oral care Follow Up Recommendations: Skilled nursing-short term rehab (<3 hours/day) Assistance recommended at discharge: Frequent or constant Supervision/Assistance SLP Visit Diagnosis: Dysphagia, unspecified (R13.10) Plan: Continue with current plan of care           Mahala Menghini., M.A. CCC-SLP Acute Rehabilitation Services Office [phone]  Secure chat preferred   03/10/2022, 11:00 AM

## 2022-03-11 DIAGNOSIS — G9340 Encephalopathy, unspecified: Secondary | ICD-10-CM | POA: Diagnosis not present

## 2022-03-11 DIAGNOSIS — I10 Essential (primary) hypertension: Secondary | ICD-10-CM | POA: Diagnosis not present

## 2022-03-11 DIAGNOSIS — Z7189 Other specified counseling: Secondary | ICD-10-CM | POA: Diagnosis not present

## 2022-03-11 DIAGNOSIS — R569 Unspecified convulsions: Secondary | ICD-10-CM | POA: Diagnosis not present

## 2022-03-11 DIAGNOSIS — Z66 Do not resuscitate: Secondary | ICD-10-CM | POA: Diagnosis not present

## 2022-03-11 DIAGNOSIS — Z515 Encounter for palliative care: Secondary | ICD-10-CM | POA: Diagnosis not present

## 2022-03-11 MED ORDER — ASPIRIN 81 MG PO CHEW
81.0000 mg | CHEWABLE_TABLET | Freq: Every evening | ORAL | Status: DC
  Administered 2022-03-11 – 2022-03-13 (×3): 81 mg via ORAL
  Filled 2022-03-11 (×3): qty 1

## 2022-03-11 MED ORDER — FOLIC ACID 1 MG PO TABS
1.0000 mg | ORAL_TABLET | Freq: Every day | ORAL | Status: DC
  Administered 2022-03-11 – 2022-03-14 (×4): 1 mg via ORAL
  Filled 2022-03-11 (×4): qty 1

## 2022-03-11 MED ORDER — AMLODIPINE BESYLATE 10 MG PO TABS
10.0000 mg | ORAL_TABLET | Freq: Every day | ORAL | Status: DC
  Administered 2022-03-11 – 2022-03-14 (×4): 10 mg via ORAL
  Filled 2022-03-11 (×4): qty 1

## 2022-03-11 NOTE — Progress Notes (Signed)
Physical Therapy Treatment Patient Details Name: Rebekah Avila MRN: [id number] DOB: Jan 10, 1932 Today's Date: 03/11/2022   History of Present Illness Rebekah Avila is a 88 y.o. female who presented as code stroke with confusion, L gaze, and difficulty speaking. Stroke workup was negative. Admitted with Acute encephalopathy suspected due to seizure, focal left hemispheric deficit. Pt with history of HTN and diabetes    PT Comments    Patient progressing well towards PT goals. Pt alert and awake today with LEs hanging over bed rail upon PT arrival. Requires Min guard assist for bed mobility and Min A of 2 for gait training (HHA) for safety. Pt continues to be confused, thinking she is at home and has been getting up and going to the store, seeing her dog, Charlie. Follows commands well. Demonstrates left sided weakness and balance deficits putting pt at increased risk for falls. Cognition seems to be improving through. Continue to recommend SNF as pt not safe to be home alone. Will follow.    Recommendations for follow up therapy are one component of a multi-disciplinary discharge planning process, led by the attending physician.  Recommendations may be updated based on patient status, additional functional criteria and insurance authorization.  Follow Up Recommendations  Skilled nursing-short term rehab (<3 hours/day) Can patient physically be transported by private vehicle: Yes   Assistance Recommended at Discharge Frequent or constant Supervision/Assistance  Patient can return home with the following Help with stairs or ramp for entrance;A little help with walking and/or transfers;A little help with bathing/dressing/bathroom;Assist for transportation;Direct supervision/assist for financial management;Direct supervision/assist for medications management   Equipment Recommendations  Rolling walker (2 wheels);BSC/3in1    Recommendations for Other Services       Precautions / Restrictions  Precautions Precautions: Fall;Other (comment) Precaution Comments: posey Restrictions Weight Bearing Restrictions: No     Mobility  Bed Mobility Overal bed mobility: Needs Assistance Bed Mobility: Supine to Sit     Supine to sit: Min guard, HOB elevated     General bed mobility comments: Able to get to EOB wtihout assist with min guard for safety.    Transfers Overall transfer level: Needs assistance Equipment used: None Transfers: Sit to/from Stand Sit to Stand: Min assist           General transfer comment: Min A to power to standing from EOB x2, transferred to chair post ambulation.    Ambulation/Gait Ambulation/Gait assistance: Min assist, +2 safety/equipment Gait Distance (Feet):  (+ 15') Assistive device: 2 person hand held assist Gait Pattern/deviations: Step-through pattern, Decreased stride length, Decreased dorsiflexion - left   Gait velocity interpretation: 1.31 - 2.62 ft/sec, indicative of limited community ambulator   General Gait Details: Mildly unsteady gait with LLE weakness, decreased DF at initial contact and dragging of LLE at times esp when fatigued. HHA x2 for balance. 1 seated rest break, holding rail on bed for BUE support walking to chair.   Stairs             Wheelchair Mobility    Modified Rankin (Stroke Patients Only)       Balance Overall balance assessment: Needs assistance Sitting-balance support: Feet supported, No upper extremity supported Sitting balance-Leahy Scale: Good     Standing balance support: During functional activity Standing balance-Leahy Scale: Poor Standing balance comment: Requires Min A for standing balance-statically and dynamically.  Cognition Arousal/Alertness: Awake/alert Behavior During Therapy: WFL for tasks assessed/performed Overall Cognitive Status: Impaired/Different from baseline Area of Impairment: Orientation, Attention, Memory, Following  commands, Safety/judgement, Awareness, Problem solving                 Orientation Level: Disoriented to, Place, Time, Situation Current Attention Level: Focused Memory: Decreased short-term memory Following Commands: Follows one step commands consistently, Follows multi-step commands inconsistently, Follows one step commands with increased time Safety/Judgement: Decreased awareness of safety, Decreased awareness of deficits Awareness: Intellectual Problem Solving: Slow processing, Requires verbal cues General Comments: Pt alert and awake, legs hanging off rail in bed upon arrival trying to take off foley connector on leg. Pt thinks she is at home, :"i just went out to the store and have been getting up and moving." "Just saw [name]," referring to her dog. Able to recall she was in the hospital at end of session. Knew her daughter, Rebekah Avila.        Exercises      General Comments General comments (skin integrity, edema, etc.): daughter, Rebekah Avila, present at end of session.      Pertinent Vitals/Pain Pain Assessment Pain Assessment: Faces Faces Pain Scale: No hurt    Home Living                          Prior Function            PT Goals (current goals can now be found in the care plan section) Progress towards PT goals: Progressing toward goals    Frequency    Min 2X/week      PT Plan Current plan remains appropriate    Co-evaluation              AM-PAC PT "6 Clicks" Mobility   Outcome Measure  Help needed turning from your back to your side while in a flat bed without using bedrails?: A Little Help needed moving from lying on your back to sitting on the side of a flat bed without using bedrails?: A Little Help needed moving to and from a bed to a chair (including a wheelchair)?: A Little Help needed standing up from a chair using your arms (e.g., wheelchair or bedside chair)?: A Little Help needed to walk in hospital room?: A Lot Help needed  climbing 3-5 steps with a railing? : Total 6 Click Score: 15    End of Session Equipment Utilized During Treatment: Gait belt Activity Tolerance: Patient tolerated treatment well Patient left: in chair;with call bell/phone within reach;with restraints reapplied;with chair alarm set Nurse Communication: Mobility status PT Visit Diagnosis: Other abnormalities of gait and mobility (R26.89);Other symptoms and signs involving the nervous system (R29.898)     Time: [id number] PT Time Calculation (min) (ACUTE ONLY): 20 min  Charges:  $Gait Training: 8-22 mins                     Marisa Severin, PT, DPT Acute Rehabilitation Services Secure chat preferred Office [place] 03/11/2022, 2:48 PM

## 2022-03-11 NOTE — Progress Notes (Signed)
Patient continues attempting to leave the bed, despite repeated attempts to reorient.   Continues removing essential equipment. Will administer Prn medication.

## 2022-03-11 NOTE — Progress Notes (Addendum)
Neurology Progress Note  Brief HPI: 88 year old patient with history of hypertension, hyperlipidemia and diabetes was admitted yesterday with confusion, aphasia, right hemianopsia and left gaze deviation.  At baseline, she is normally alert and oriented with some memory loss.  Per her daughter she was at baseline normal mental status Friday evening.  CT head showed no acute abnormality, CTA with perfusion was performed and revealed no LVO and no perfusion deficit.  MRI shows no acute abnormality.  Although patient does not have a history of seizures, there is a question of seizure activity with prolonged postictal confusion.  LTM EEG shows left sided slowing but no seizure activity. LP under fluoro traumatic tap and only 3cc of CSF obtained. Negative meningitis/encephalitis biofire panel, prelim cultures negative, elevated protein but confounded by the fact that this was a truamtic tap with "red colored" csf and unable to count RBCs or WBCs 2/2 clots in the specimen. Autoimmune and paraneoplastic panel is unremarkable to date.  S:// No family at the bedside. She is awake and alert, interacting. She is oriented to self and "hospital" and "Thanksgiving".  She is unable to state her correct age "33" and states we are in [employer] No gaze preference, eyes are midline and she is tracking.    O:// Current vital signs: BP (!) 160/69   Pulse (!) 104   Temp 98.1 F (36.7 C) (Oral)   Resp 14   Ht 5' (1.524 m)   Wt 50.8 kg   SpO2 100%   BMI 21.87 kg/m  Vital signs in last 24 hours: Temp:  [97.6 F (36.4 C)-98.3 F (36.8 C)] 98.1 F (36.7 C) (11/17 1103) Pulse Rate:  [85-104] 104 (11/17 1103) Resp:  [14-18] 14 (11/17 1103) BP: (157-194)/(69-103) 160/69 (11/17 1103) SpO2:  [94 %-100 %] 100 % (11/17 1103)  GENERAL: frail elderly female in NAD HEENT: - Normocephalic and atraumatic, moist mm Ext: Warm, well perfused, intact peripheral pulses, no edema  NEURO:  Mental Status:She is awake and  alert, interacting. She is oriented to self and "hospital" and "Thanksgiving".  She is unable to state her correct age "71" and states we are in [place] No gaze preference, eyes are midline and she is tracking.   Language: Follows simple commands without difficulty. Cranial Nerves: PERRL, no gaze preference today.  Able to track both sides.  Blinks to threat on both sides, no facial asymmetry, facial sensation intact, hearing intact. Motor: moves all 4 extremities antigravity without vertical drift or asymmetry Tone: is normal and bulk is normal Sensation- Intact to light touch bilaterally Coordination: Does not perform  Gait- deferred  More verbal than yesterday.  Medications  Current Facility-Administered Medications:     stroke: early stages of recovery book, , Does not apply, Once, Smith, Rondell A, MD   0.9 % NaCl with KCl 40 mEq / L  infusion, , Intravenous, Continuous, Alekh, Kshitiz, MD, Last Rate: 50 mL/hr at 03/11/22 0621, Infusion Verify at 03/11/22 1610   acetaminophen (TYLENOL) tablet 650 mg, 650 mg, Oral, Q4H PRN **OR** acetaminophen (TYLENOL) 160 MG/5ML solution 650 mg, 650 mg, Per Tube, Q4H PRN **OR** acetaminophen (TYLENOL) suppository 650 mg, 650 mg, Rectal, Q4H PRN, Smith, Rondell A, MD   amLODipine (NORVASC) tablet 10 mg, 10 mg, Oral, Daily, Alekh, Kshitiz, MD, 10 mg at 03/11/22 0827   aspirin EC tablet 81 mg, 81 mg, Oral, QPM, Smith, Rondell A, MD, 81 mg at 03/10/22 1815   aspirin suppository 300 mg, 300 mg, Rectal, Once, Smith, Rondell A,  MD   Chlorhexidine Gluconate Cloth 2 % PADS 6 each, 6 each, Topical, Daily, Standley Brooking, MD, 6 each at 03/11/22 0831   enoxaparin (LOVENOX) injection 30 mg, 30 mg, Subcutaneous, Q24H, Pham, Minh Q, RPH-CPP, 30 mg at 03/10/22 1815   folic acid (FOLVITE) tablet 1 mg, 1 mg, Oral, Daily, Alekh, Kshitiz, MD, 1 mg at 03/11/22 1148   haloperidol lactate (HALDOL) injection 1 mg, 1 mg, Intramuscular, Q6H PRN, Mansy, Jan A, MD, 1 mg at  03/11/22 0127   hydrALAZINE (APRESOLINE) injection 10 mg, 10 mg, Intravenous, Q4H PRN, Madelyn Flavors A, MD   levETIRAcetam (KEPPRA) tablet 500 mg, 500 mg, Oral, BID, 500 mg at 03/11/22 0827 **OR** levETIRAcetam (KEPPRA) IVPB 500 mg/100 mL premix, 500 mg, Intravenous, BID, Erick Blinks, MD   LORazepam (ATIVAN) injection 0.5 mg, 0.5 mg, Intravenous, Once, Standley Brooking, MD   sodium chloride flush (NS) 0.9 % injection 3 mL, 3 mL, Intravenous, Once, Rondel Baton, MD   tamsulosin [facility]) capsule 0.4 mg, 0.4 mg, Oral, QPC supper, Alekh, Kshitiz, MD, 0.4 mg at 03/10/22 1815   traZODone (DESYREL) tablet 25 mg, 25 mg, Oral, QHS PRN, Mansy, Jan A, MD, 25 mg at 03/10/22 2333  Labs CBC    Component Value Date/Time   WBC 8.0 03/08/2022 0355   RBC 3.12 (L) 03/08/2022 0355   HGB 9.8 (L) 03/08/2022 0355   HGB 11.0 (L) 11/16/2021 1214   HCT 27.8 (L) 03/08/2022 0355   HCT 32.4 (L) 11/16/2021 1214   PLT 186 03/08/2022 0355   PLT 228 11/16/2021 1214   MCV 89.1 03/08/2022 0355   MCV 88 11/16/2021 1214   MCH 31.4 03/08/2022 0355   MCHC 35.3 03/08/2022 0355   RDW 12.9 03/08/2022 0355   RDW 12.7 11/16/2021 1214   LYMPHSABS 0.8 03/05/2022 1525   MONOABS 0.6 03/05/2022 1525   EOSABS 0.0 03/05/2022 1525   BASOSABS 0.0 03/05/2022 1525   CMP     Component Value Date/Time   NA 140 03/10/2022 0403   NA 140 11/16/2021 1214   K 2.8 (L) 03/10/2022 0403   CL 105 03/10/2022 0403   CO2 22 03/10/2022 0403   GLUCOSE 178 (H) 03/10/2022 0403   BUN 14 03/10/2022 0403   BUN 36 (H) 11/16/2021 1214   CREATININE 0.98 03/10/2022 0403   CREATININE 0.91 03/01/2022 1136   CALCIUM 8.5 (L) 03/10/2022 0403   PROT 6.5 03/05/2022 1525   PROT 8.0 11/16/2021 1214   ALBUMIN 3.4 (L) 03/05/2022 1525   ALBUMIN 4.6 11/16/2021 1214   AST 23 03/05/2022 1525   ALT 11 03/05/2022 1525   ALKPHOS 32 (L) 03/05/2022 1525   BILITOT 0.8 03/05/2022 1525   BILITOT 0.7 11/16/2021 1214   GFRNONAA 56 (L) 03/10/2022 0403    GFRAA 62 12/17/2019 1008   Imaging I have reviewed images in epic and the results pertinent to this consultation are:  CT-scan of the brain 1. No evidence of acute intracranial abnormality.  ASPECTS is 10. 2. Chronic microvascular ischemic disease.  CTA head and neck with perf:  1. No emergent large vessel occlusion or proximal high-grade stenosis. 2. No evidence of core infarct or penumbra on CT perfusion. 3. Small (1-2 mm) outpouching arising from the inter communicating artery, suspicious for aneurysm.  MRI brain 03/05/22: No acute abnormality.  Repeat MRI Brain 03/08/22: No acute abnormality.  LTM 11/13-11/14 This study is suggestive of cortical dysfunction arising from left temporal region likely secondary to underlying structural abnormality, post-ictal state.  No seizures or epileptiform discharges were seen throughout the recording.  LTM 11/14-11/15: One seizure without clinical sign was noted during the study on 03/09/2022 at 0902 arising from left temporal region, lasting for about 4.5 minutes.  Additionally the study was suggestive of cortical dysfunction arising from left temporal region likely secondary to underlying structural abnormality, post-ictal state.     LTM 11/15-11/16 This study was suggestive of cortical dysfunction arising from left temporal region likely secondary to underlying structural abnormality, post-ictal state.  Additionally there is moderate decrease encephalopathy, nonspecific etiology.  No definite seizures were seen during the study.    Labs: B1 96.1 B12 386 RPR negative  TSH normal  Serum Autoimmune encephalitis panel negative.  Results for orders placed or performed in visit on 03/07/22  CSF culture w Gram Stain     Status: None   Collection Time: 03/07/22  3:12 PM   Specimen: PATH Cytology CSF; Cerebrospinal Fluid  Result Value Ref Range Status   Specimen Description CSF  Final   Special Requests NONE  Final   Gram Stain NO WBC  SEEN NO ORGANISMS SEEN   Final   Culture   Final    NO GROWTH 3 DAYS Performed at [facility], [address], [place], [place] [address]    Report Status 03/10/2022 FINAL  Final  Paraneoplastic antibody panel and serum-negative  Assessment:  88 year old patient with history of hypertension, hyperlipidemia and diabetes was admitted yesterday with confusion, aphasia, right hemianopsia and left gaze deviation.  At baseline, she is normally alert and oriented with some memory loss.  Per her daughter she was at baseline normal mental status Friday evening.  She has had improvement in neurologic exam as she is starting to speak with ongoing expressive aphasia but follows simple commands without difficulty.  LP under fluoroscopy resulted in only a small amount of bloody CSF, but meningitis encephalitis panel was negative, and protein was 296. MRI Brain with no acute abnormalities, repeat MRI Brain again non revealing. LTM EEG with no seizures on the first day but notable for left hemispheric slowing, more so in the left temple. LTM EEG captured a focal subclinical left temporal seizure lasting 4.5 mins. Given the seizure 11/15, likely etiology of the earlier noted aphasia and R hemianopsia was prolonged post ictal state.  Etiology of her seizures is unclear. No prior hx of seizures per daughter Dondra Spry. Differential includes seizures in the setting of dementia onset, possible autoimmune encephalitis.  Paraneoplastic panel on serum negative.  Recommendations: -Status post 3 days of Solu-Medrol 1000 mg IV-completed 03/10/2022. - Supplement B12 for > 400  - continue Keppra 500mg  BID. -Suspect improvement to be slow and gradual.  Neurology will be available as needed. Discussed with Dr. DNP, ACNPC-AG    Attending Neurohospitalist Addendum Patient seen and examined with APP/Resident. Agree with the history and physical as documented above. Agree with the plan as  documented, which I helped formulate. I have independently reviewed the chart, obtained history, review of systems and examined the patient.I have personally reviewed pertinent head/neck/spine imaging (CT/MRI). Please feel free to call with any questions.  -- Jenene Slicker, MD Neurologist [facility] Pager: [phone]

## 2022-03-11 NOTE — Progress Notes (Signed)
PROGRESS NOTE    Rebekah Avila  TAV:[id number] DOB: 23-Nov-1931 DOA: 03/05/2022 PCP: Sharon Seller, NP   Brief Narrative:  88 year old female with history of diabetes mellitus type 2, hypertension, hyperlipidemia who apparently lives alone and performs all ADLs independently was found confused and aphasic by EMS with left gaze deviation and right hemianopsia along with word finding difficulties and confusion.  On presentation, CT head was negative for acute intracranial abnormality.  CTA of head and neck showed no LVO, no perfusion deficit.  Seen by neurology.  MRI of brain was negative for acute intracranial abnormality.  Assessment & Plan:   Acute encephalopathy suspected due to possible seizure, focal left hemispheric deficit Dysphagia -Imaging of the head and neck including CT of the head; CTA of head and neck; MRI brain has been unrevealing so far. -Concern for seizure: Initially started on Keppra but subsequently discontinued by neurology.  Neurology following.  EEG negative for seizure.  LTM EEG from 03/08/2022-03/09/2022 showed 1 seizure without clinical sign.  Neurology has restarted Keppra.   -Has completed 3 days of 1 g IV Solu-Medrol daily as per neurology for possible autoimmune encephalitis.  Serum paraneoplastic encephalitis panel and autoimmune encephalitis panel are pending -Unable to obtain much CSF even under fluoroscopy guidance -Mental status has not improved much. -Diet as per SLP recommendations -PT/OT following and recommending SNF placement.  TOC following  Acute urinary retention -Foley catheter placed on 03/07/2022.  Continue Flomax  Essential hypertension -Blood pressure on the higher side.  Start amlodipine 10 mg daily.  Diabetes mellitus type 2, controlled. -A1c 6.5 on 11/16/2021.  Not on any medications at home  Hyperlipidemia -On pravastatin daily Monday through Friday, skipping weekends.  Currently on hold because of n.p.o. status  Normocytic  anemia, possibly anemia of chronic disease -Questionable cause.  Hemoglobin stable.  Monitor intermittently  Hypokalemia -No labs today.  Hypomagnesemia -Improved  Hyponatremia -resolved  Physical deconditioning Goals of care -Overall condition looks guarded.  Palliative care follow.  CODE STATUS has been changed to DNR.  DVT prophylaxis: Lovenox Code Status: DNR Family Communication: None at bedside Disposition Plan: Status is: Inpatient Remains inpatient appropriate because: Of severity of illness.     Consultants: Neurology.  palliative care  Procedures: As above.  Antimicrobials: None   Subjective: Patient seen and examined at bedside.  Poor historian.  Extremely slow to respond.  No agitation, vomiting, fever or seizures reported.   Objective: Vitals:   03/10/22 2311 03/11/22 0501 03/11/22 0627 03/11/22 0728  BP: (!) 177/83 (!) 194/100 (!) 192/97 (!) 180/103  Pulse: 93 95  (!) 102  Resp: 18 16  18   Temp: 97.9 F (36.6 C) 97.6 F (36.4 C)  98.3 F (36.8 C)  TempSrc: Oral Oral  Oral  SpO2: 96% 100%  94%  Weight:      Height:        Intake/Output Summary (Last 24 hours) at 03/11/2022 0752 Last data filed at 03/11/2022 03/13/2022 Gross per 24 hour  Intake 1054.17 ml  Output 2650 ml  Net -1595.83 ml    Filed Weights   03/05/22 1615  Weight: 50.8 kg    Examination:  General: No acute distress currently.  Still on room air.  Looks chronically ill and deconditioned. ENT/neck: No palpable neck masses or thyromegaly noted.   Respiratory: Bilateral decreased breath sounds at bases with scattered crackles CVS: Rate mostly controlled; S1 and S2 are heard  abdominal: Soft, nontender, still distended; no organomegaly, normal bowel sounds are  heard  extremities: No clubbing; mild lower extremity edema present  CNS: Alert; still confused; still very slow to respond.  Poor historian.  No focal neurologic deficit.  Able to move extremities  lymph: No palpable  cervical lymphadenopathy noted Skin: No obvious petechiae/rashes psych: Very flat affect.  Showing no signs of agitation  musculoskeletal: No obvious other joint swelling/deformity     Data Reviewed: I have personally reviewed following labs and imaging studies  CBC: Recent Labs  Lab 03/05/22 1525 03/05/22 1608 03/08/22 0355  WBC 6.6  --  8.0  NEUTROABS 5.2  --   --   HGB 9.1* 8.8* 9.8*  HCT 26.8* 26.0* 27.8*  MCV 92.1  --  89.1  PLT 204  --  186    Basic Metabolic Panel: Recent Labs  Lab 03/06/22 1114 03/07/22 0309 03/08/22 0355 03/09/22 0641 03/10/22 0403  NA 138 134* 140 139 140  K 3.3* 3.5 3.4* 3.4* 2.8*  CL 103 98 107 104 105  CO2 22 22 21* 16* 22  GLUCOSE 103* 96 77 148* 178*  BUN 5* 6* 9 15 14   CREATININE 0.92 1.01* 0.98 1.07* 0.98  CALCIUM 9.1 9.2 8.7* 8.9 8.5*  MG 1.4* 2.0  --  1.4* 1.7  PHOS 2.9 3.1  --  4.2  --     GFR: Estimated Creatinine Clearance: 29.1 mL/min (by C-G formula based on SCr of 0.98 mg/dL). Liver Function Tests: Recent Labs  Lab 03/05/22 1525  AST 23  ALT 11  ALKPHOS 32*  BILITOT 0.8  PROT 6.5  ALBUMIN 3.4*    No results for input(s): "LIPASE", "AMYLASE" in the last 168 hours. No results for input(s): "AMMONIA" in the last 168 hours. Coagulation Profile: Recent Labs  Lab 03/05/22 1525  INR 1.2    Cardiac Enzymes: No results for input(s): "CKTOTAL", "CKMB", "CKMBINDEX", "TROPONINI" in the last 168 hours. BNP (last 3 results) No results for input(s): "PROBNP" in the last 8760 hours. HbA1C: No results for input(s): "HGBA1C" in the last 72 hours. CBG: Recent Labs  Lab 03/05/22 1526 03/06/22 1439 03/07/22 1706  GLUCAP 110* 112* 89    Lipid Profile: No results for input(s): "CHOL", "HDL", "LDLCALC", "TRIG", "CHOLHDL", "LDLDIRECT" in the last 72 hours. Thyroid Function Tests: No results for input(s): "TSH", "T4TOTAL", "FREET4", "T3FREE", "THYROIDAB" in the last 72 hours.  Anemia Panel: No results for  input(s): "VITAMINB12", "FOLATE", "FERRITIN", "TIBC", "IRON", "RETICCTPCT" in the last 72 hours.  Sepsis Labs: No results for input(s): "PROCALCITON", "LATICACIDVEN" in the last 168 hours.  Recent Results (from the past 240 hour(s))  CSF culture w Gram Stain     Status: None   Collection Time: 03/07/22  3:12 PM   Specimen: PATH Cytology CSF; Cerebrospinal Fluid  Result Value Ref Range Status   Specimen Description CSF  Final   Special Requests NONE  Final   Gram Stain NO WBC SEEN NO ORGANISMS SEEN   Final   Culture   Final    NO GROWTH 3 DAYS Performed at [facility], [address], [place], [place]    Report Status 03/10/2022 FINAL  Final         Radiology Studies: No results found.      Scheduled Meds:   stroke: early stages of recovery book   Does not apply Once   aspirin EC  81 mg Oral QPM   aspirin  300 mg Rectal Once   Chlorhexidine Gluconate Cloth  6 each Topical Daily  enoxaparin (LOVENOX) injection  30 mg Subcutaneous Q24H   levETIRAcetam  500 mg Oral BID   LORazepam  0.5 mg Intravenous Once   sodium chloride flush  3 mL Intravenous Once   tamsulosin  0.4 mg Oral QPC supper   Continuous Infusions:  0.9 % NaCl with KCl 40 mEq / L 50 mL/hr at 03/11/22 2761   levETIRAcetam            Glade Lloyd, MD [facility] 03/11/2022, 7:52 AM

## 2022-03-11 NOTE — TOC Progression Note (Signed)
Transition of Care [facility]) - Progression Note    Patient Details  Name: Rebekah Avila MRN: [id number] Date of Birth: [birth year]  Transition of Care [facility] CM/SW Contact  Carley Hammed, [place] Phone Number: 03/11/2022, 11:45 AM  Clinical Narrative:     CSW notified pt is not medically ready to DC to [facility] yet. Pt has been approved for SNF Approved 11/16 - 11/20, review due 11/20. Reference ID: [id number]. Per facility, pt can admit on Monday if medically ready. TOC will continue to follow for DC needs.  Expected Discharge Plan: Skilled Nursing Facility Barriers to Discharge: English as a second language teacher, Continued Medical Work up, SNF Pending bed offer  Expected Discharge Plan and Services Expected Discharge Plan: Skilled Nursing Facility     Post Acute Care Choice: Skilled Nursing Facility Living arrangements for the past 2 months: Single Family Home                                       Social Determinants of Health (SDOH) Interventions    Readmission Risk Interventions     No data to display

## 2022-03-11 NOTE — Progress Notes (Signed)
Patient continues attempting to leave the bed, mitts were placed.  Attempted music, TV and reorientation without success.

## 2022-03-11 NOTE — Progress Notes (Signed)
Daily Progress Note   Patient Name: Rebekah Avila       Date: 03/11/2022 DOB: October 12, 1931  Age: 88 y.o. MRN#: [id number] Attending Physician: Rebekah Lloyd, MD Primary Care Physician: Rebekah Seller, NP Admit Date: 03/05/2022  Reason for Consultation/Follow-up: Establishing goals of care  Subjective: Smiling - no complaints though aphasia continues. Appears comfortable and without concerns.   Length of Stay: 6  Current Medications: Scheduled Meds:    stroke: early stages of recovery book   Does not apply Once   amLODipine  10 mg Oral Daily   aspirin EC  81 mg Oral QPM   aspirin  300 mg Rectal Once   Chlorhexidine Gluconate Cloth  6 each Topical Daily   enoxaparin (LOVENOX) injection  30 mg Subcutaneous Q24H   levETIRAcetam  500 mg Oral BID   LORazepam  0.5 mg Intravenous Once   sodium chloride flush  3 mL Intravenous Once   tamsulosin  0.4 mg Oral QPC supper    Continuous Infusions:  0.9 % NaCl with KCl 40 mEq / L 50 mL/hr at 03/11/22 0300   levETIRAcetam      PRN Meds: acetaminophen **OR** acetaminophen (TYLENOL) oral liquid 160 mg/5 mL **OR** acetaminophen, haloperidol lactate, hydrALAZINE, traZODone  Physical Exam Constitutional:      General: She is not in acute distress.    Appearance: She is ill-appearing.  Pulmonary:     Effort: Pulmonary effort is normal.  Skin:    General: Skin is warm and dry.  Neurological:     Mental Status: She is alert.             Vital Signs: BP (!) 180/103 (BP Location: Right Arm)   Pulse (!) 102   Temp 98.3 F (36.8 C) (Oral)   Resp 18   Ht 5' (1.524 m)   Wt 50.8 kg   SpO2 94%   BMI 21.87 kg/m  SpO2: SpO2: 94 % O2 Device: O2 Device: Room Air O2 Flow Rate: O2 Flow Rate (L/min): 0 L/min  Intake/output summary:  Intake/Output  Summary (Last 24 hours) at 03/11/2022 1012 Last data filed at 03/11/2022 0900 Gross per 24 hour  Intake 1154.17 ml  Output 2650 ml  Net -1495.83 ml   LBM: Last BM Date : 03/10/22 Baseline Weight: Weight: 50.8 kg Most recent weight: Weight: 50.8 kg       Palliative Assessment/Data: PPS 40%      Patient Active Problem List   Diagnosis Date Noted   Acute encephalopathy 03/05/2022   Aphasia 03/05/2022   Stroke-like symptoms 03/05/2022   Hypocalcemia 03/05/2022   Seizure (HCC) 03/05/2022   AKI (acute kidney injury) (HCC) 11/21/2021   Hypokalemia 11/21/2021   Type 2 diabetes mellitus (HCC) 11/21/2021   Hyperlipidemia 11/21/2021   Chronic kidney disease, stage 3a (HCC) 11/21/2021   Normocytic anemia 11/21/2021   Hyperbilirubinemia 11/21/2021   Hyperproteinemia 11/21/2021   Increased anion gap metabolic acidosis 11/21/2021   Polymyalgia rheumatica (HCC) 08/19/2019   Type 2 diabetes mellitus with stage 3 chronic kidney disease, without long-term current use of insulin (HCC) 04/12/2018   Nephropathy 08/03/2017   Hypertensive kidney disease with chronic kidney disease stage III (HCC) 08/03/2017   TIA (  transient ischemic attack) 06/30/2013   HTN (hypertension) 06/30/2013    Palliative Care Assessment & Plan   HPI: 88 y.o. female  with past medical history of  diabetes mellitus type 2, hypertension, and hyperlipidemia admitted on 03/05/2022 with AMS.  Imaging negative.  EEG revealed seizure.  Patient has been started on AEDs.  Mental status not at baseline.  PMT consulted to discuss goals of care.   Assessment: Patient comfortable and without complaint this morning. Per chart review - some agitation overnight. Is in belt restraint this morning.  She is able to tell me her name and that she is in the hospital - better than yesterday. Call to daughter Rebekah Avila - we review agitation overnight and ongoing concerns over mental status not at baseline though there is some improvement. Rebekah Avila  is happy to continue with current care and allow time for outcomes. Rebekah Avila is planning for patient discharge to SNF rehab.  PMT will check in early next week if patient remains hospitalized.   Recommendations/Plan: Code status DNR/DNI - signed DNR on chart Continue other measures and allow time for outcomes Planning for dc to rehab facility PMT to check in early next week if remains hospitalized  Code Status: DNR  Discharge Planning: Skilled Nursing Facility for rehab with Palliative care service follow-up  Care plan was discussed with daughter  Thank you for allowing the Palliative Medicine Team to assist in the care of this patient.  *Please note that this is a verbal dictation therefore any spelling or grammatical errors are due to the "[employer] One" system interpretation.  Rebekah Ren, DNP, [facility] Palliative Medicine Team Team Phone # [phone]  Pager [phone]

## 2022-03-12 DIAGNOSIS — R569 Unspecified convulsions: Secondary | ICD-10-CM | POA: Diagnosis not present

## 2022-03-12 DIAGNOSIS — G9340 Encephalopathy, unspecified: Secondary | ICD-10-CM | POA: Diagnosis not present

## 2022-03-12 LAB — BASIC METABOLIC PANEL
Anion gap: 10 (ref 5–15)
BUN: 10 mg/dL (ref 8–23)
CO2: 28 mmol/L (ref 22–32)
Calcium: 8.6 mg/dL — ABNORMAL LOW (ref 8.9–10.3)
Chloride: 104 mmol/L (ref 98–111)
Creatinine, Ser: 0.77 mg/dL (ref 0.44–1.00)
GFR, Estimated: >60 mL/min (ref >60)
Glucose, Bld: 146 mg/dL — ABNORMAL HIGH (ref 70–99)
Potassium: 2.9 mmol/L — ABNORMAL LOW (ref 3.5–5.1)
Sodium: 142 mmol/L (ref 135–145)

## 2022-03-12 LAB — AMMONIA: Ammonia: 31 umol/L (ref 9–35)

## 2022-03-12 LAB — MAGNESIUM: Magnesium: 1.3 mg/dL — ABNORMAL LOW (ref 1.7–2.4)

## 2022-03-12 MED ORDER — MAGNESIUM SULFATE 2 GM/50ML IV SOLN
2.0000 g | Freq: Once | INTRAVENOUS | Status: AC
  Administered 2022-03-12: 2 g via INTRAVENOUS
  Filled 2022-03-12: qty 50

## 2022-03-12 MED ORDER — POTASSIUM CHLORIDE CRYS ER 20 MEQ PO TBCR
40.0000 meq | EXTENDED_RELEASE_TABLET | ORAL | Status: AC
  Administered 2022-03-12 (×2): 40 meq via ORAL
  Filled 2022-03-12 (×2): qty 2

## 2022-03-12 NOTE — Progress Notes (Signed)
Speech Language Pathology Treatment: Dysphagia;Cognitive-Linquistic  Patient Details Name: Rebekah Avila MRN: [id number] DOB: [birth year] Today's Date: 03/12/2022 Time: 0017-4944 SLP Time Calculation (min) (ACUTE ONLY): 21 min  Assessment / Plan / Recommendation Clinical Impression  Rebekah Avila was assisted with juice and  applesauce.  Soft mitt was removed from right hand and she fed herself with cues needed to persist through task. She demonstrated improved oral attention and initiation to self-feed and drank 4 oz of juice with no s/s of aspiration. 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 Demonstrated much improved ability to follow commands with min cues needed for two step instructions related to self-feeding and washing her face.   Continue SLP for swallowing/cognitive-communication.  HPI HPI: 88 yo female adm to [facility] with AMS= left gaze preference, aphasia- suspected seizure.  MRI showed Pt is s/p LP 11/13 with minimal fluid removed - and pt is currently on EEG - EEG showed "suggestive of cortical dysfunction arising from left temporal region likely secondary to underlying structural abnormality, post-ictal state. No seizures or epileptiform discharges were seen throughout the recording".  MRI 03/05/2022 . Small remote right cerebellar lacunar  infarcts. Moderate patchy T2/FLAIR hyperintensities within the white  matter, compatible with chronic microvascular ischemic disease.  Cerebral atrophy.   Speech and swallow evaluation ordered. Daughter present during session and advised that pt eats regular/thin diet at home and has no communication deficits. She tends to lay to the right side per daughter.  Patient has not been seen by SLP per chart review - due to her AMS.      SLP Plan  Continue with current plan of care      Recommendations for follow up therapy are one component of a multi-disciplinary discharge  planning process, led by the attending physician.  Recommendations may be updated based on patient status, additional functional criteria and insurance authorization.    Recommendations  Diet recommendations: Dysphagia 1 (puree);Thin liquid Liquids provided via: Cup;No straw Medication Administration: Whole meds with puree Supervision: Patient able to self feed;Full supervision/cueing for compensatory strategies;Staff to assist with self feeding Compensations: Slow rate;Small sips/bites;Minimize environmental distractions Postural Changes and/or Swallow Maneuvers: Seated upright 90 degrees                Oral Care Recommendations: Oral care BID;Staff/trained caregiver to provide oral care Follow Up Recommendations: Skilled nursing-short term rehab (<3 hours/day) Assistance recommended at discharge: Frequent or constant Supervision/Assistance SLP Visit Diagnosis: Dysphagia, unspecified (R13.10) Plan: Continue with current plan of care         Lenville Hibberd L. Samson Frederic, MA CCC/SLP Clinical Specialist - Acute Care SLP Acute Rehabilitation Services Office number [phone]   Blenda Mounts Laurice  03/12/2022, 12:19 PM

## 2022-03-12 NOTE — Progress Notes (Signed)
PROGRESS NOTE    Rebekah Avila  ZOX:[id number] DOB: [birth year] DOA: 03/05/2022 PCP: Sharon Seller, NP   Brief Narrative:  88 year old female with history of diabetes mellitus type 2, hypertension, hyperlipidemia who apparently lives alone and performs all ADLs independently was found confused and aphasic by EMS with left gaze deviation and right hemianopsia along with word finding difficulties and confusion.  On presentation, CT head was negative for acute intracranial abnormality.  CTA of head and neck showed no LVO, no perfusion deficit.  Seen by neurology.  MRI of brain was negative for acute intracranial abnormality.  Assessment & Plan:   Acute encephalopathy suspected due to possible seizure, focal left hemispheric deficit Dysphagia -Imaging of the head and neck including CT of the head; CTA of head and neck; MRI brain has been unrevealing so far. -Concern for seizure: Initially started on Keppra but subsequently discontinued by neurology.  Neurology following.  EEG negative for seizure.  LTM EEG from 03/08/2022-03/09/2022 showed 1 seizure without clinical sign.  Neurology has restarted Keppra.  Neurology signed off on 03/11/2022. -Has completed 3 days of 1 g IV Solu-Medrol daily as per neurology for possible autoimmune encephalitis.  Serum paraneoplastic encephalitis panel and autoimmune encephalitis panel are pending -Unable to obtain much CSF even under fluoroscopy guidance -Mental status slightly improving. -Diet as per SLP recommendations -PT/OT following and recommending SNF placement.  TOC following  Acute urinary retention -Foley catheter placed on 03/07/2022.  Continue Flomax  Essential hypertension -Blood pressure improving.  Continue amlodipine 10 mg daily.  Diabetes mellitus type 2, controlled. -A1c 6.5 on 11/16/2021.  Not on any medications at home  Hyperlipidemia -On pravastatin daily Monday through Friday, skipping weekends.  Resume on discharge.  Normocytic  anemia, possibly anemia of chronic disease -Questionable cause.  Hemoglobin stable.  Monitor intermittently  Hypokalemia -Replace.  Repeat a.m. labs.  Hypomagnesemia -Replace.  Repeat a.m. labs.  Hyponatremia -resolved  Physical deconditioning Goals of care -Overall condition looks guarded.  Palliative care following.  CODE STATUS has been changed to DNR. -Will need SNF placement.  DVT prophylaxis: Lovenox Code Status: DNR Family Communication: None at bedside Disposition Plan: Status is: Inpatient Remains inpatient appropriate because: Of severity of illness.     Consultants: Neurology.  palliative care  Procedures: As above.  Antimicrobials: None   Subjective: Patient seen and examined at bedside.  Poor historian.  Still slow to respond.  No seizures, vomiting, agitation or fevers reported.  Objective: Vitals:   03/11/22 1937 03/11/22 2338 03/12/22 0506 03/12/22 0719  BP: (!) 160/108 (!) 163/85 (!) 163/92 (!) 147/60  Pulse: (!) 104 94 88 66  Resp: 16 14 12 18   Temp: 98.2 F (36.8 C) 98.3 F (36.8 C) 97.8 F (36.6 C) 98.2 F (36.8 C)  TempSrc: Oral Oral Oral   SpO2: 99% 99% 99%   Weight:      Height:        Intake/Output Summary (Last 24 hours) at 03/12/2022 0809 Last data filed at 03/12/2022 0100 Gross per 24 hour  Intake 1085.74 ml  Output 1750 ml  Net -664.26 ml    Filed Weights   03/05/22 1615  Weight: 50.8 kg    Examination:  General: On room air in no distress.  Looks chronically ill and deconditioned. ENT/neck: No obvious JVD elevation or palpable neck masses noted Respiratory: Decreased breath sounds at bases bilaterally with some crackles  CVS: S1-S2 heard; mostly rate controlled abdominal: Soft, nontender, distended mildly; no organomegaly, bowel sound is  heard normally  extremities: Trace lower extremity edema present; no cyanosis  CNS: Sleepy; wakes up very slightly; still confused and still very slow to respond.  Poor historian.   No focal neurologic deficit.  Moving extremities  lymph: No obvious lymphadenopathy palpable  skin: No obvious ecchymosis/lesions  psych: Not agitated.  Flat affect. musculoskeletal: No obvious joint erythema/tenderness    Data Reviewed: I have personally reviewed following labs and imaging studies  CBC: Recent Labs  Lab 03/05/22 1525 03/05/22 1608 03/08/22 0355  WBC 6.6  --  8.0  NEUTROABS 5.2  --   --   HGB 9.1* 8.8* 9.8*  HCT 26.8* 26.0* 27.8*  MCV 92.1  --  89.1  PLT 204  --  186    Basic Metabolic Panel: Recent Labs  Lab 03/06/22 1114 03/07/22 0309 03/08/22 0355 03/09/22 0641 03/10/22 0403 03/12/22 0426  NA 138 134* 140 139 140 142  K 3.3* 3.5 3.4* 3.4* 2.8* 2.9*  CL 103 98 107 104 105 104  CO2 22 22 21* 16* 22 28  GLUCOSE 103* 96 77 148* 178* 146*  BUN 5* 6* 9 15 14 10   CREATININE 0.92 1.01* 0.98 1.07* 0.98 0.77  CALCIUM 9.1 9.2 8.7* 8.9 8.5* 8.6*  MG 1.4* 2.0  --  1.4* 1.7 1.3*  PHOS 2.9 3.1  --  4.2  --   --     GFR: Estimated Creatinine Clearance: 35.6 mL/min (by C-G formula based on SCr of 0.77 mg/dL). Liver Function Tests: Recent Labs  Lab 03/05/22 1525  AST 23  ALT 11  ALKPHOS 32*  BILITOT 0.8  PROT 6.5  ALBUMIN 3.4*    No results for input(s): "LIPASE", "AMYLASE" in the last 168 hours. Recent Labs  Lab 03/12/22 0426  AMMONIA 31   Coagulation Profile: Recent Labs  Lab 03/05/22 1525  INR 1.2    Cardiac Enzymes: No results for input(s): "CKTOTAL", "CKMB", "CKMBINDEX", "TROPONINI" in the last 168 hours. BNP (last 3 results) No results for input(s): "PROBNP" in the last 8760 hours. HbA1C: No results for input(s): "HGBA1C" in the last 72 hours. CBG: Recent Labs  Lab 03/05/22 1526 03/06/22 1439 03/07/22 1706  GLUCAP 110* 112* 89    Lipid Profile: No results for input(s): "CHOL", "HDL", "LDLCALC", "TRIG", "CHOLHDL", "LDLDIRECT" in the last 72 hours. Thyroid Function Tests: No results for input(s): "TSH", "T4TOTAL",  "FREET4", "T3FREE", "THYROIDAB" in the last 72 hours.  Anemia Panel: No results for input(s): "VITAMINB12", "FOLATE", "FERRITIN", "TIBC", "IRON", "RETICCTPCT" in the last 72 hours.  Sepsis Labs: No results for input(s): "PROCALCITON", "LATICACIDVEN" in the last 168 hours.  Recent Results (from the past 240 hour(s))  CSF culture w Gram Stain     Status: None   Collection Time: 03/07/22  3:12 PM   Specimen: PATH Cytology CSF; Cerebrospinal Fluid  Result Value Ref Range Status   Specimen Description CSF  Final   Special Requests NONE  Final   Gram Stain NO WBC SEEN NO ORGANISMS SEEN   Final   Culture   Final    NO GROWTH 3 DAYS Performed at [facility], [address], [place], [place]    Report Status 03/10/2022 FINAL  Final         Radiology Studies: No results found.      Scheduled Meds:   stroke: early stages of recovery book   Does not apply Once   amLODipine  10 mg Oral Daily   aspirin  81 mg Oral  QPM   aspirin  300 mg Rectal Once   Chlorhexidine Gluconate Cloth  6 each Topical Daily   enoxaparin (LOVENOX) injection  30 mg Subcutaneous Q24H   folic acid  1 mg Oral Daily   levETIRAcetam  500 mg Oral BID   LORazepam  0.5 mg Intravenous Once   sodium chloride flush  3 mL Intravenous Once   tamsulosin  0.4 mg Oral QPC supper   Continuous Infusions:  0.9 % NaCl with KCl 40 mEq / L 50 mL/hr at 03/11/22 1223   levETIRAcetam            Glade Lloyd, MD [facility] 03/12/2022, 8:09 AM

## 2022-03-13 DIAGNOSIS — G9340 Encephalopathy, unspecified: Secondary | ICD-10-CM | POA: Diagnosis not present

## 2022-03-13 DIAGNOSIS — E119 Type 2 diabetes mellitus without complications: Secondary | ICD-10-CM | POA: Diagnosis not present

## 2022-03-13 DIAGNOSIS — R569 Unspecified convulsions: Secondary | ICD-10-CM | POA: Diagnosis not present

## 2022-03-13 LAB — BASIC METABOLIC PANEL
Anion gap: 8 (ref 5–15)
BUN: 12 mg/dL (ref 8–23)
CO2: 26 mmol/L (ref 22–32)
Calcium: 8.2 mg/dL — ABNORMAL LOW (ref 8.9–10.3)
Chloride: 107 mmol/L (ref 98–111)
Creatinine, Ser: 0.81 mg/dL (ref 0.44–1.00)
GFR, Estimated: >60 mL/min (ref >60)
Glucose, Bld: 126 mg/dL — ABNORMAL HIGH (ref 70–99)
Potassium: 4 mmol/L (ref 3.5–5.1)
Sodium: 141 mmol/L (ref 135–145)

## 2022-03-13 LAB — MAGNESIUM: Magnesium: 1.7 mg/dL (ref 1.7–2.4)

## 2022-03-13 NOTE — Progress Notes (Signed)
PROGRESS NOTE    Rebekah Avila  MPN:[id number] DOB: January 20, 1932 DOA: 03/05/2022 PCP: Sharon Seller, NP   Brief Narrative:  88 year old female with history of diabetes mellitus type 2, hypertension, hyperlipidemia who apparently lives alone and performs all ADLs independently was found confused and aphasic by EMS with left gaze deviation and right hemianopsia along with word finding difficulties and confusion.  On presentation, CT head was negative for acute intracranial abnormality.  CTA of head and neck showed no LVO, no perfusion deficit.  Seen by neurology.  MRI of brain was negative for acute intracranial abnormality.  Assessment & Plan:   Acute encephalopathy suspected due to possible seizure, focal left hemispheric deficit Dysphagia -Imaging of the head and neck including CT of the head; CTA of head and neck; MRI brain has been unrevealing so far. -Concern for seizure: Initially started on Keppra but subsequently discontinued by neurology.  Neurology following.  EEG negative for seizure.  LTM EEG from 03/08/2022-03/09/2022 showed 1 seizure without clinical sign.  Neurology has restarted Keppra.  Neurology signed off on 03/11/2022. -Has completed 3 days of 1 g IV Solu-Medrol daily as per neurology for possible autoimmune encephalitis.  Serum paraneoplastic encephalitis panel and autoimmune encephalitis panel are pending -Unable to obtain much CSF even under fluoroscopy guidance -Mental status slightly improving. -Diet as per SLP recommendations -PT/OT following and recommending SNF placement.  TOC following  Acute urinary retention -Foley catheter placed on 03/07/2022.  Continue Flomax.    Essential hypertension -Blood pressure improving.  Continue amlodipine 10 mg daily.  Diabetes mellitus type 2, controlled. -A1c 6.5 on 11/16/2021.  Not on any medications at home  Hyperlipidemia -On pravastatin daily Monday through Friday, skipping weekends.  Resume on  discharge.  Normocytic anemia, possibly anemia of chronic disease -Questionable cause.  Hemoglobin stable.  Monitor intermittently  Hypokalemia -Improved  Hypomagnesemia -Improved  Hyponatremia -resolved  Physical deconditioning Goals of care -Overall condition looks guarded.  Palliative care following.  CODE STATUS has been changed to DNR. -Will need SNF placement.  DVT prophylaxis: Lovenox Code Status: DNR Family Communication: None at bedside Disposition Plan: Status is: Inpatient Remains inpatient appropriate because: Of severity of illness.  Need for SNF placement.   Consultants: Neurology.  palliative care  Procedures: As above.  Antimicrobials: None   Subjective: Patient seen and examined at bedside.  Poor historian.  Still slow to respond.  No agitation, fever, seizures or vomiting reported.  Objective: Vitals:   03/12/22 1604 03/12/22 1944 03/12/22 2325 03/13/22 0359  BP: 131/63 (!) 140/67 (!) 160/74 (!) 147/71  Pulse: 90 84 91 88  Resp:  16 16 18   Temp:  97.6 F (36.4 C) 98.2 F (36.8 C) 98.4 F (36.9 C)  TempSrc:  Oral Oral Oral  SpO2: 100% 99% 98% 98%  Weight:      Height:        Intake/Output Summary (Last 24 hours) at 03/13/2022 0803 Last data filed at 03/13/2022 0600 Gross per 24 hour  Intake 1476.65 ml  Output 550 ml  Net 926.65 ml    Filed Weights   03/05/22 1615  Weight: 50.8 kg    Examination:  General: On room air.  No distress.  Chronically ill looking and deconditioned.  Extremely slow to respond.  Poor historian.  Sleepy, wakes up slightly, confused to time. respiratory: Decreased breath sounds at bases bilaterally with some crackles CVS: Currently rate controlled; S1-S2 heard  abdominal: Soft, nontender, slightly distended, no organomegaly; normal bowel sounds are heard  extremities: Trace lower extremity edema; no clubbing.       Data Reviewed: I have personally reviewed following labs and imaging  studies  CBC: Recent Labs  Lab 03/08/22 0355  WBC 8.0  HGB 9.8*  HCT 27.8*  MCV 89.1  PLT 186    Basic Metabolic Panel: Recent Labs  Lab 03/06/22 1114 03/07/22 0309 03/08/22 0355 03/09/22 0641 03/10/22 0403 03/12/22 0426 03/13/22 0424  NA 138 134* 140 139 140 142 141  K 3.3* 3.5 3.4* 3.4* 2.8* 2.9* 4.0  CL 103 98 107 104 105 104 107  CO2 22 22 21* 16* 22 28 26   GLUCOSE 103* 96 77 148* 178* 146* 126*  BUN 5* 6* 9 15 14 10 12   CREATININE 0.92 1.01* 0.98 1.07* 0.98 0.77 0.81  CALCIUM 9.1 9.2 8.7* 8.9 8.5* 8.6* 8.2*  MG 1.4* 2.0  --  1.4* 1.7 1.3* 1.7  PHOS 2.9 3.1  --  4.2  --   --   --     GFR: Estimated Creatinine Clearance: 35.1 mL/min (by C-G formula based on SCr of 0.81 mg/dL). Liver Function Tests: No results for input(s): "AST", "ALT", "ALKPHOS", "BILITOT", "PROT", "ALBUMIN" in the last 168 hours.  No results for input(s): "LIPASE", "AMYLASE" in the last 168 hours. Recent Labs  Lab 03/12/22 0426  AMMONIA 31    Coagulation Profile: No results for input(s): "INR", "PROTIME" in the last 168 hours.  Cardiac Enzymes: No results for input(s): "CKTOTAL", "CKMB", "CKMBINDEX", "TROPONINI" in the last 168 hours. BNP (last 3 results) No results for input(s): "PROBNP" in the last 8760 hours. HbA1C: No results for input(s): "HGBA1C" in the last 72 hours. CBG: Recent Labs  Lab 03/06/22 1439 03/07/22 1706  GLUCAP 112* 89    Lipid Profile: No results for input(s): "CHOL", "HDL", "LDLCALC", "TRIG", "CHOLHDL", "LDLDIRECT" in the last 72 hours. Thyroid Function Tests: No results for input(s): "TSH", "T4TOTAL", "FREET4", "T3FREE", "THYROIDAB" in the last 72 hours.  Anemia Panel: No results for input(s): "VITAMINB12", "FOLATE", "FERRITIN", "TIBC", "IRON", "RETICCTPCT" in the last 72 hours.  Sepsis Labs: No results for input(s): "PROCALCITON", "LATICACIDVEN" in the last 168 hours.  Recent Results (from the past 240 hour(s))  CSF culture w Gram Stain      Status: None   Collection Time: 03/07/22  3:12 PM   Specimen: PATH Cytology CSF; Cerebrospinal Fluid  Result Value Ref Range Status   Specimen Description CSF  Final   Special Requests NONE  Final   Gram Stain NO WBC SEEN NO ORGANISMS SEEN   Final   Culture   Final    NO GROWTH 3 DAYS Performed at [facility], [address], [place], [address] [place]    Report Status 03/10/2022 FINAL  Final         Radiology Studies: No results found.      Scheduled Meds:   stroke: early stages of recovery book   Does not apply Once   amLODipine  10 mg Oral Daily   aspirin  81 mg Oral QPM   Chlorhexidine Gluconate Cloth  6 each Topical Daily   enoxaparin (LOVENOX) injection  30 mg Subcutaneous Q24H   folic acid  1 mg Oral Daily   levETIRAcetam  500 mg Oral BID   tamsulosin  0.4 mg Oral QPC supper   Continuous Infusions:  0.9 % NaCl with KCl 40 mEq / L 50 mL/hr at 03/12/22 2305   levETIRAcetam  Glade Lloyd, MD [facility] 03/13/2022, 8:03 AM

## 2022-03-14 ENCOUNTER — Ambulatory Visit: Payer: Medicare Other | Admitting: Nurse Practitioner

## 2022-03-14 DIAGNOSIS — M6259 Muscle wasting and atrophy, not elsewhere classified, multiple sites: Secondary | ICD-10-CM | POA: Diagnosis not present

## 2022-03-14 DIAGNOSIS — R4701 Aphasia: Secondary | ICD-10-CM | POA: Diagnosis not present

## 2022-03-14 DIAGNOSIS — Z741 Need for assistance with personal care: Secondary | ICD-10-CM | POA: Diagnosis not present

## 2022-03-14 DIAGNOSIS — E119 Type 2 diabetes mellitus without complications: Secondary | ICD-10-CM | POA: Diagnosis not present

## 2022-03-14 DIAGNOSIS — R1311 Dysphagia, oral phase: Secondary | ICD-10-CM | POA: Diagnosis not present

## 2022-03-14 DIAGNOSIS — R41841 Cognitive communication deficit: Secondary | ICD-10-CM | POA: Diagnosis not present

## 2022-03-14 DIAGNOSIS — N183 Chronic kidney disease, stage 3 unspecified: Secondary | ICD-10-CM | POA: Diagnosis not present

## 2022-03-14 DIAGNOSIS — B379 Candidiasis, unspecified: Secondary | ICD-10-CM | POA: Diagnosis not present

## 2022-03-14 DIAGNOSIS — R2681 Unsteadiness on feet: Secondary | ICD-10-CM | POA: Diagnosis not present

## 2022-03-14 DIAGNOSIS — M6281 Muscle weakness (generalized): Secondary | ICD-10-CM | POA: Diagnosis not present

## 2022-03-14 DIAGNOSIS — R569 Unspecified convulsions: Secondary | ICD-10-CM | POA: Diagnosis not present

## 2022-03-14 DIAGNOSIS — G9340 Encephalopathy, unspecified: Secondary | ICD-10-CM | POA: Diagnosis not present

## 2022-03-14 DIAGNOSIS — E1122 Type 2 diabetes mellitus with diabetic chronic kidney disease: Secondary | ICD-10-CM | POA: Diagnosis not present

## 2022-03-14 DIAGNOSIS — R414 Neurologic neglect syndrome: Secondary | ICD-10-CM | POA: Diagnosis not present

## 2022-03-14 DIAGNOSIS — E785 Hyperlipidemia, unspecified: Secondary | ICD-10-CM | POA: Diagnosis not present

## 2022-03-14 DIAGNOSIS — I10 Essential (primary) hypertension: Secondary | ICD-10-CM | POA: Diagnosis not present

## 2022-03-14 MED ORDER — LEVETIRACETAM 500 MG PO TABS: 500.0000 mg | ORAL_TABLET | Freq: Two times a day (BID) | ORAL | 0 refills | Status: DC

## 2022-03-14 MED ORDER — TAMSULOSIN HCL 0.4 MG PO CAPS: 0.4000 mg | ORAL_CAPSULE | Freq: Every day | ORAL | 0 refills | Status: DC

## 2022-03-14 MED ORDER — FOLIC ACID 1 MG PO TABS: 1.0000 mg | ORAL_TABLET | Freq: Every day | ORAL | 0 refills | Status: AC

## 2022-03-14 MED ORDER — TRAZODONE HCL 50 MG PO TABS: 25.0000 mg | ORAL_TABLET | Freq: Every evening | ORAL | 0 refills | Status: DC | PRN

## 2022-03-14 NOTE — TOC Transition Note (Signed)
Transition of Care [facility]) - CM/SW Discharge Note   Patient Details  Name: SHERROL VICARS MRN: [id number] Date of Birth: [birth year]  Transition of Care [facility]) CM/SW Contact:  Deatra Robinson, [place] Phone Number: 03/14/2022, 11:25 AM   Clinical Narrative: Pt for dc to [facility] today. Spoke to [place] in admissions who confirmed they are prepared to admit pt to room 224. Pt's dtr Dondra Spry aware of dc and reports agreeable. PTAR arranged for transport and RN provided with number for report. SW signing off at dc.   Dellie Burns, MSW, LCSW [phone] (coverage)        Final next level of care: Skilled Nursing Facility Barriers to Discharge: No Barriers Identified   Patient Goals and CMS Choice Patient states their goals for this hospitalization and ongoing recovery are:: Pt is disoriented and unable to participate in goal setting. CMS Medicare.gov Compare Post Acute Care list provided to:: Patient Represenative (must comment) (Daughter) Choice offered to / list presented to : Adult Children  Discharge Placement              Patient chooses bed at: [facility] Patient to be transferred to facility by: PTAR Name of family member notified: Gail/dtr Patient and family notified of of transfer: 03/14/22  Discharge Plan and Services     Post Acute Care Choice: Skilled Nursing Facility                               Social Determinants of Health (SDOH) Interventions     Readmission Risk Interventions     No data to display

## 2022-03-14 NOTE — Progress Notes (Signed)
Mobility Specialist: Progress Note   03/14/22 1043  Mobility  Activity Ambulated with assistance in hallway  Level of Assistance Moderate assist, patient does 50-74%  Assistive Device Front wheel walker  Distance Ambulated (ft) 150 ft  Activity Response Tolerated well  Mobility Referral Yes  $Mobility charge 1 Mobility   Pre-Mobility: 91 HR Post-Mobility: 101 HR  Pt received in the bed and agreeable to mobility. MinA with bed mobility as well as to stand. To BR upon standing as small BM in the bed. BM successful in BR. ModA to stand from commode and agreeable to hallway ambulation. Drift L/R with RW but favoring L side. Verbal cues and physical assist for RW management, proximity, and upright posture. Pt back to bed after session with RN present in the room.   Krystianna Soth Mobility Specialist Please contact via SecureChat or Rehab office at [phone]

## 2022-03-14 NOTE — Progress Notes (Signed)
PROGRESS NOTE    Rebekah Avila  GDJ:[id number] DOB: 1931/10/24 DOA: 03/05/2022 PCP: Sharon Seller, NP   Brief Narrative:  88 year old female with history of diabetes mellitus type 2, hypertension, hyperlipidemia who apparently lives alone and performs all ADLs independently was found confused and aphasic by EMS with left gaze deviation and right hemianopsia along with word finding difficulties and confusion.  On presentation, CT head was negative for acute intracranial abnormality.  CTA of head and neck showed no LVO, no perfusion deficit.  Seen by neurology.  MRI of brain was negative for acute intracranial abnormality.  Patient completed 3 days of IV Solu-Medrol; patient has been on Keppra as per neurology.  Neurology signed off on 03/11/2022.  PT recommended SNF placement. Assessment & Plan:   Acute encephalopathy suspected due to possible seizure, focal left hemispheric deficit Dysphagia -Imaging of the head and neck including CT of the head; CTA of head and neck; MRI brain has been unrevealing so far. -Concern for seizure: Initially started on Keppra but subsequently discontinued by neurology.  Neurology following.  EEG negative for seizure.  LTM EEG from 03/08/2022-03/09/2022 showed 1 seizure without clinical sign.  Neurology has restarted Keppra.  Neurology signed off on 03/11/2022. -Has completed 3 days of 1 g IV Solu-Medrol daily as per neurology for possible autoimmune encephalitis.  Serum paraneoplastic encephalitis panel and autoimmune encephalitis panel are pending -Unable to obtain much CSF even under fluoroscopy guidance -Mental status slightly improving. -Diet as per SLP recommendations -PT/OT following and recommending SNF placement.  TOC following  Acute urinary retention -Foley catheter placed on 03/07/2022.  Continue Flomax.  We will give voiding trial today.  Essential hypertension -Blood pressure improving.  Continue amlodipine 10 mg daily.  Diabetes mellitus  type 2, controlled. -A1c 6.5 on 11/16/2021.  Not on any medications at home  Hyperlipidemia -On pravastatin daily Monday through Friday, skipping weekends.  Resume on discharge.  Normocytic anemia, possibly anemia of chronic disease -Questionable cause.  Hemoglobin stable.  Monitor intermittently  Hypokalemia -Improved  Hypomagnesemia -Improved  Hyponatremia -resolved  Physical deconditioning Goals of care -Overall condition looks guarded.  Palliative care following.  CODE STATUS has been changed to DNR. -Will need SNF placement.  DVT prophylaxis: Lovenox Code Status: DNR Family Communication: None at bedside Disposition Plan: Status is: Inpatient Remains inpatient appropriate because: Of severity of illness.  Need for SNF placement.   Consultants: Neurology.  palliative care  Procedures: As above.  Antimicrobials: None   Subjective: Patient seen and examined at bedside.  Poor historian.  Still slow to respond.  No agitation, fever, seizures or vomiting reported.  Objective: Vitals:   03/13/22 2013 03/13/22 2325 03/14/22 0408 03/14/22 0749  BP: (!) 149/75 (!) 155/71 (!) 156/80 (!) 179/72  Pulse: 98 80 72 78  Resp: 14 16 16 18   Temp: 98.2 F (36.8 C) 98.6 F (37 C) 98.1 F (36.7 C) 98 F (36.7 C)  TempSrc: Oral Axillary Oral Oral  SpO2: 100% 98% 98% 99%  Weight:      Height:        Intake/Output Summary (Last 24 hours) at 03/14/2022 0948 Last data filed at 03/14/2022 0520 Gross per 24 hour  Intake 1552.24 ml  Output 1600 ml  Net -47.76 ml    Filed Weights   03/05/22 1615  Weight: 50.8 kg    Examination:  General: On room air.  No distress.  Chronically ill looking and deconditioned.  Extremely slow to respond.  Poor historian.  Sleepy,  wakes up slightly, confused to time. respiratory: Decreased breath sounds at bases bilaterally with some crackles CVS: Currently rate controlled; S1-S2 heard  abdominal: Soft, nontender, slightly distended, no  organomegaly; normal bowel sounds are heard  extremities: Trace lower extremity edema; no clubbing.       Data Reviewed: I have personally reviewed following labs and imaging studies  CBC: Recent Labs  Lab 03/08/22 0355  WBC 8.0  HGB 9.8*  HCT 27.8*  MCV 89.1  PLT 186    Basic Metabolic Panel: Recent Labs  Lab 03/08/22 0355 03/09/22 0641 03/10/22 0403 03/12/22 0426 03/13/22 0424  NA 140 139 140 142 141  K 3.4* 3.4* 2.8* 2.9* 4.0  CL 107 104 105 104 107  CO2 21* 16* 22 28 26   GLUCOSE 77 148* 178* 146* 126*  BUN 9 15 14 10 12   CREATININE 0.98 1.07* 0.98 0.77 0.81  CALCIUM 8.7* 8.9 8.5* 8.6* 8.2*  MG  --  1.4* 1.7 1.3* 1.7  PHOS  --  4.2  --   --   --     GFR: Estimated Creatinine Clearance: 35.1 mL/min (by C-G formula based on SCr of 0.81 mg/dL). Liver Function Tests: No results for input(s): "AST", "ALT", "ALKPHOS", "BILITOT", "PROT", "ALBUMIN" in the last 168 hours.  No results for input(s): "LIPASE", "AMYLASE" in the last 168 hours. Recent Labs  Lab 03/12/22 0426  AMMONIA 31    Coagulation Profile: No results for input(s): "INR", "PROTIME" in the last 168 hours.  Cardiac Enzymes: No results for input(s): "CKTOTAL", "CKMB", "CKMBINDEX", "TROPONINI" in the last 168 hours. BNP (last 3 results) No results for input(s): "PROBNP" in the last 8760 hours. HbA1C: No results for input(s): "HGBA1C" in the last 72 hours. CBG: Recent Labs  Lab 03/07/22 1706  GLUCAP 89    Lipid Profile: No results for input(s): "CHOL", "HDL", "LDLCALC", "TRIG", "CHOLHDL", "LDLDIRECT" in the last 72 hours. Thyroid Function Tests: No results for input(s): "TSH", "T4TOTAL", "FREET4", "T3FREE", "THYROIDAB" in the last 72 hours.  Anemia Panel: No results for input(s): "VITAMINB12", "FOLATE", "FERRITIN", "TIBC", "IRON", "RETICCTPCT" in the last 72 hours.  Sepsis Labs: No results for input(s): "PROCALCITON", "LATICACIDVEN" in the last 168 hours.  Recent Results (from the past  240 hour(s))  CSF culture w Gram Stain     Status: None   Collection Time: 03/07/22  3:12 PM   Specimen: PATH Cytology CSF; Cerebrospinal Fluid  Result Value Ref Range Status   Specimen Description CSF  Final   Special Requests NONE  Final   Gram Stain NO WBC SEEN NO ORGANISMS SEEN   Final   Culture   Final    NO GROWTH 3 DAYS Performed at [facility], [address] [place], [address] [place]    Report Status 03/10/2022 FINAL  Final         Radiology Studies: No results found.      Scheduled Meds:  amLODipine  10 mg Oral Daily   aspirin  81 mg Oral QPM   Chlorhexidine Gluconate Cloth  6 each Topical Daily   enoxaparin (LOVENOX) injection  30 mg Subcutaneous Q24H   folic acid  1 mg Oral Daily   levETIRAcetam  500 mg Oral BID   tamsulosin  0.4 mg Oral QPC supper   Continuous Infusions:  0.9 % NaCl with KCl 40 mEq / L 50 mL/hr at 03/14/22 0344   levETIRAcetam            03/12/2022, MD [facility] 03/14/2022, 9:48 AM

## 2022-03-14 NOTE — Progress Notes (Signed)
[facility] [id number] [facility]) Hospital Liaison note:  Notified via Epic workque from Dr. Glade Lloyd of request for [facility] Palliative Care services. Will continue to follow for disposition.  Please call with any outpatient palliative questions or concerns.  Thank you for the opportunity to participate in this patient's care.  Thank you, Abran Cantor, LPN [facility] Liaison [phone]

## 2022-03-14 NOTE — Discharge Summary (Signed)
Physician Discharge Summary  Rebekah Avila XNA:[id number] DOB: 09-Jul-1931 DOA: 03/05/2022  PCP: Sharon SellerEubanks, Jessica K, NP  Admit date: 03/05/2022 Discharge date: 03/14/2022  Admitted From: Home Disposition: Home  Recommendations for Outpatient Follow-up:  Follow up with PCP in 1 week  Outpatient follow-up with neurology Outpatient follow-up with palliative care  follow up in ED if symptoms worsen or new appear   Home Health: No Equipment/Devices: None  Discharge Condition: Stable CODE STATUS: Full Diet recommendation: Heart healthy/diet as per SLP recommendations Diet recommendations: Dysphagia 1 (puree);Thin liquid Liquids provided via: Cup;No straw Medication Administration: Whole meds with puree Supervision: Patient able to self feed;Full supervision/cueing for compensatory strategies;Staff to assist with self feeding Compensations: Slow rate;Small sips/bites;Minimize environmental distractions Postural Changes and/or Swallow Maneuvers: Seated upright 90 degrees  Brief/Interim Summary: 88 year old female with history of diabetes mellitus type 2, hypertension, hyperlipidemia who apparently lives alone and performs all ADLs independently was found confused and aphasic by EMS with left gaze deviation and right hemianopsia along with word finding difficulties and confusion.  On presentation, CT head was negative for acute intracranial abnormality.  CTA of head and neck showed no LVO, no perfusion deficit.  Seen by neurology.  MRI of brain was negative for acute intracranial abnormality.  Patient completed 3 days of IV Solu-Medrol; patient has been on Keppra as per neurology.  Neurology signed off on 03/11/2022.  PT recommended SNF placement.  She will be discharged to SNF once bed is available.  Discharge Diagnoses:   Acute encephalopathy suspected due to possible seizure, focal left hemispheric deficit Dysphagia -Imaging of the head and neck including CT of the head; CTA of head and  neck; MRI brain has been unrevealing so far. -Concern for seizure: Initially started on Keppra but subsequently discontinued by neurology.  Neurology following.  EEG negative for seizure.  LTM EEG from 03/08/2022-03/09/2022 showed 1 seizure without clinical sign.  Neurology has restarted Keppra.  Neurology signed off on 03/11/2022. -Has completed 3 days of 1 g IV Solu-Medrol daily as per neurology for possible autoimmune encephalitis.  Serum paraneoplastic encephalitis panel and autoimmune encephalitis panel are pending -Unable to obtain much CSF even under fluoroscopy guidance -Mental status slightly improving. -Diet as per SLP recommendations -PT/OT following and recommending SNF placement.  TOC following -She will be discharged to SNF once bed is available.   Acute urinary retention -Foley catheter placed on 03/07/2022.  Continue Flomax.  We will give voiding trial today.  If goes back into retention, Foley catheter will be replaced and she will need outpatient follow-up with urology.   Essential hypertension -Blood pressure improving.  Continue home regimen on discharge.   Diabetes mellitus type 2, controlled. -A1c 6.5 on 11/16/2021.  Not on any medications at home   Hyperlipidemia -On pravastatin daily Monday through Friday, skipping weekends.  Resume on discharge.   Normocytic anemia, possibly anemia of chronic disease -Questionable cause.  Hemoglobin stable.  Monitor intermittently as an outpatient   Hypokalemia -Improved   Hypomagnesemia -Improved   Hyponatremia -resolved   Physical deconditioning Goals of care -Overall condition looks guarded.  Palliative care following.  CODE STATUS has been changed to DNR. -Will need SNF placement.  Discharge Instructions  Discharge Instructions     Amb Referral to Palliative Care   Complete by: As directed    Ambulatory referral to Neurology   Complete by: As directed    An appointment is requested in approximately: 2 weeks    Diet - low sodium heart healthy  Complete by: As directed    Diet as per SLP recommendations   Increase activity slowly   Complete by: As directed       Allergies as of 03/14/2022       Reactions   Morphine And Related Rash, Other (See Comments)   Syncope, also        Medication List     STOP taking these medications    GINKOBA PO   potassium chloride SA 20 MEQ tablet Commonly known as: KLOR-CON M       TAKE these medications    acetaminophen 500 MG tablet Commonly known as: TYLENOL Take 1,000 mg by mouth every 6 (six) hours as needed for moderate pain.   aspirin EC 81 MG tablet Take 81 mg by mouth every evening.   CoQ10 100 MG Caps Take 100 mg by mouth daily at 12 noon.   Fish Oil 1000 MG Caps Take 1,000 mg by mouth daily with breakfast.   fluticasone 50 MCG/ACT nasal spray Commonly known as: FLONASE Place 1 spray into both nostrils daily as needed for allergies or rhinitis.   folic acid 1 MG tablet Commonly known as: FOLVITE Take 1 tablet (1 mg total) by mouth daily.   levETIRAcetam 500 MG tablet Commonly known as: KEPPRA Take 1 tablet (500 mg total) by mouth 2 (two) times daily.   losartan 25 MG tablet Commonly known as: COZAAR Take 1 tablet (25 mg total) by mouth daily.   metoprolol tartrate 50 MG tablet Commonly known as: LOPRESSOR Take 1 tablet (50 mg total) by mouth 2 (two) times daily.   pravastatin 80 MG tablet Commonly known as: PRAVACHOL One tab po M-F, skip weekends What changed:  how much to take how to take this when to take this additional instructions   tamsulosin 0.4 MG Caps capsule Commonly known as: FLOMAX Take 1 capsule (0.4 mg total) by mouth daily after supper.   traZODone 50 MG tablet Commonly known as: DESYREL Take 0.5 tablets (25 mg total) by mouth at bedtime as needed for sleep.        Allergies  Allergen Reactions   Morphine And Related Rash and Other (See Comments)    Syncope, also     Consultations: Neurology/palliative care   Procedures/Studies: MR BRAIN W WO CONTRAST  Result Date: 03/08/2022 CLINICAL DATA:  Persistent left gaze deviation. EXAM: MRI HEAD WITHOUT AND WITH CONTRAST TECHNIQUE: Multiplanar, multiecho pulse sequences of the brain and surrounding structures were obtained without and with intravenous contrast. CONTRAST:  5mL GADAVIST GADOBUTROL 1 MMOL/ML IV SOLN COMPARISON:  Noncontrast brain MRI 03/05/2022. FINDINGS: Brain: There is no acute intracranial hemorrhage, extra-axial fluid collection, or acute infarct. There is unchanged parenchymal volume loss with prominence of the ventricular system and extra-axial CSF spaces. There is unchanged confluent FLAIR signal abnormality in the supratentorial brain likely reflecting sequela of chronic small-vessel ischemic change. There are unchanged small remote lacunar infarcts in the right cerebellar hemisphere and right parieto-occipital white matter. The pituitary and suprasellar region are normal. The corpus callosum is normal. There is no abnormal enhancement. There is no mass lesion. There is no mass effect or midline shift. Vascular: Normal flow voids. Skull and upper cervical spine: Normal marrow signal. Sinuses/Orbits: There is mucosal thickening in the left sphenoid sinus Bilateral lens implants are in place. The globes and orbits are otherwise unremarkable. Other: None. IMPRESSION: Stable brain MRI with no acute intracranial pathology or abnormal enhancement. Electronically Signed   By: Selena Lesser.D.  On: 03/08/2022 14:09   Overnight EEG with video  Result Date: 03/08/2022 Charlsie Quest, MD     03/09/2022  9:43 AM Patient Name: Rebekah Avila MRN: [id number] Epilepsy Attending: Charlsie Quest Referring Physician/Provider: Milon Dikes, MD  Duration: 03/07/2022 1837 to 03/08/2022 2000  Patient history: 88yo F with ams. EEG to evaluate for seizure  Level of alertness: Awake, asleep  AEDs during EEG study:  None  Technical aspects: This EEG study was done with scalp electrodes positioned according to the 10-20 International system of electrode placement. Electrical activity was reviewed with band pass filter of 1-70Hz , sensitivity of 7 uV/mm, display speed of 37mm/sec with a  notched filter applied as appropriate. EEG data were recorded continuously and digitally stored.  Video monitoring was available and reviewed as appropriate.  Description: The posterior dominant rhythm consists of  activity of moderate voltage (25-35 uV) seen predominantly in posterior head regions, asymmetric ( left<right) and reactive to eye opening and eye closing. Sleep was characterized by vertex waves, sleep spindles (12 to 14 Hz), asymmetric ( left<right) maximal frontocentral region. EEG showed continuous 3 to 5 Hz 11 delta slowing in left temporal region. Hyperventilation and photic stimulation were not performed.   EEG was disconnected between 03/08/2022 1300 to 1427 for MRI  ABNORMALITY - Continuous slow, left temporal region - Background asymmetry, left<right - Spindles asymmetry, left<right  IMPRESSION: This study is suggestive of cortical dysfunction arising from left temporal region likely secondary to underlying structural abnormality, post-ictal state. No seizures or epileptiform discharges were seen throughout the recording.  Priyanka Annabelle Harman    DG FL GUIDED LUMBAR PUNCTURE  Result Date: 03/07/2022 CLINICAL DATA:  88 year old patient with acute onset of confusion, aphasia, and left gaze deviation. Brain imaging is negative for stroke. Request for lumbar puncture. EXAM: LUMBAR PUNCTURE UNDER FLUOROSCOPY PROCEDURE: An appropriate skin entry site was determined fluoroscopically. Operator donned sterile gloves and mask. Skin site was marked, then prepped with Betadine, draped in usual sterile fashion, and infiltrated locally with 1% lidocaine. A 20 gauge spinal needle advanced at L4-L5 from a left and right interlaminar  approach. Several attempts at advancing the needle into the thecal sac were unsuccessful. A 20 gauge spinal needle advanced at L3-L4 from a left and right interlaminar approach. After several attempts the needle, there was return of blood tinged CSF. Opening pressure measured at 9 cm water. Only 3 ml CSF was able to be collected and divided among 3 sterile vials for the requested laboratory studies. Low volume return was likely due to lower pressure. The needle was then removed. The patient tolerated the procedure well and there were no complications. FLUOROSCOPY: Radiation Exposure Index (as provided by the fluoroscopic device): 5.9 mGy Kerma IMPRESSION: Technically difficult but successful lumbar puncture under fluoroscopy. Only 3 mm CSF was able to be collected. This was sent to the laboratory for analysis. This exam was performed by Corrin Parker, PA-C, and was supervised and interpreted by Caprice Renshaw, MD. Electronically Signed   By: Caprice Renshaw M.D.   On: 03/07/2022 15:29   Overnight EEG with video  Result Date: 03/06/2022 Charlsie Quest, MD     03/07/2022 10:23 AM Patient Name: Rebekah Avila MRN: [id number] Epilepsy Attending: Charlsie Quest Referring Physician/Provider: Marjorie Smolder, NP Duration: 03/05/2022 1652 to 03/06/2022 1356 Patient history: 89yo F with ams. EEG to evaluate for seizure Level of alertness: Awake, asleep AEDs during EEG study: None Technical aspects: This EEG study was  done with scalp electrodes positioned according to the 10-20 International system of electrode placement. Electrical activity was reviewed with band pass filter of 1-70Hz , sensitivity of 7 uV/mm, display speed of 27mm/sec with a 60Hz  notched filter applied as appropriate. EEG data were recorded continuously and digitally stored.  Video monitoring was available and reviewed as appropriate. Description: The posterior dominant rhythm consists of 8Hz  activity of moderate voltage (25-35 uV) seen predominantly in  posterior head regions, asymmetric ( left<right) and reactive to eye opening and eye closing. Sleep was characterized by vertex waves, sleep spindles (12 to 14 Hz), maximal frontocentral region. EEG showed continuous low amplitude 2-3hz  delta slowing in left hemisphere. Hyperventilation and photic stimulation were not performed.   ABNORMALITY - Continuous slow, left hemisphere - Background asymmetry, left<right IMPRESSION: This study is suggestive of cortical dysfunction arising from left hemisphere likely secondary to underlying structural abnormality, post-ictal state. No seizures or epileptiform discharges were seen throughout the recording.   MR BRAIN WO CONTRAST  Result Date: 03/05/2022 CLINICAL DATA:  Neuro deficit, acute, stroke suspected EXAM: MRI HEAD WITHOUT CONTRAST TECHNIQUE: Multiplanar, multiecho pulse sequences of the brain and surrounding structures were obtained without intravenous contrast. COMPARISON:  CT head from the same day. FINDINGS: Motion limited study. Brain: No acute infarction, hemorrhage, hydrocephalus, extra-axial collection or mass lesion. Small remote right cerebellar lacunar infarcts. Moderate patchy T2/FLAIR hyperintensities within the white matter, compatible with chronic microvascular ischemic disease. Cerebral atrophy. Vascular: Major arterial flow voids are maintained skull base. Skull and upper cervical spine: Normal marrow signal. Sinuses/Orbits: Negative. Other: No mastoid effusions. IMPRESSION: No evidence of acute intracranial abnormality. Motion limited study. Electronically Signed   By: Charlsie Quest M.D.   On: 03/05/2022 18:20   CT ANGIO HEAD NECK W WO CM W PERF (CODE STROKE)  Result Date: 03/05/2022 CLINICAL DATA:  Neuro deficit, acute, stroke suspected EXAM: CT ANGIOGRAPHY HEAD AND NECK CT PERFUSION BRAIN TECHNIQUE: Multidetector CT imaging of the head and neck was performed using the standard protocol during bolus administration of  intravenous contrast. Multiplanar CT image reconstructions and MIPs were obtained to evaluate the vascular anatomy. Carotid stenosis measurements (when applicable) are obtained utilizing NASCET criteria, using the distal internal carotid diameter as the denominator. Multiphase CT imaging of the brain was performed following IV bolus contrast injection. Subsequent parametric perfusion maps were calculated using RAPID software. RADIATION DOSE REDUCTION: This exam was performed according to the departmental dose-optimization program which includes automated exposure control, adjustment of the mA and/or kV according to patient size and/or use of iterative reconstruction technique. COMPARISON:  None Available. FINDINGS: Aortic arch: Great vessel origins are patent without significant stenosis. Right carotid system: Atherosclerosis at the carotid bifurcation without greater than 50% stenosis. Left carotid system: Atherosclerosis at the carotid bifurcation without greater than 50% stenosis Vertebral arteries: Codominant. No evidence of dissection, stenosis (50% or greater), or occlusion. Skeleton: Severe multilevel degenerative change. Other neck: No acute findings. Upper chest: Visualized lung apices are clear. Review of the MIP images confirms the above findings CTA HEAD FINDINGS Anterior circulation: Bilateral intracranial ICAs, MCAs, and ACAs are patent without proximal hemodynamically significant stenosis. Small (1-2 mm) outpouching arising from the inter communicating artery, suspicious for aneurysm. Posterior circulation: Bilateral intradural vertebral arteries, basilar artery, and bilateral posterior cerebral arteries are patent without proximal hemodynamically significant stenosis. Venous sinuses: As permitted by contrast timing, patent. CT Brain Perfusion Findings: ASPECTS: 10. CBF (<30%) Volume: 42mL Perfusion (Tmax>6.0s) volume: 31mL Mismatch Volume: 67mL Infarction Location:None  identified. IMPRESSION: 1. No  emergent large vessel occlusion or proximal high-grade stenosis. 2. No evidence of core infarct or penumbra on CT perfusion. 3. Small (1-2 mm) outpouching arising from the inter communicating artery, suspicious for aneurysm. Urgent findings discussed with Dr. Derry Lory via telephone at 3:50 p.m. Electronically Signed   By: Feliberto Harts M.D.   On: 03/05/2022 15:59   CT HEAD CODE STROKE WO CONTRAST  Result Date: 03/05/2022 CLINICAL DATA:  Code stroke.  Neuro deficit, acute, stroke suspected EXAM: CT HEAD WITHOUT CONTRAST TECHNIQUE: Contiguous axial images were obtained from the base of the skull through the vertex without intravenous contrast. RADIATION DOSE REDUCTION: This exam was performed according to the departmental dose-optimization program which includes automated exposure control, adjustment of the mA and/or kV according to patient size and/or use of iterative reconstruction technique. COMPARISON:  CT head October 30, 23. FINDINGS: Brain: No evidence of acute large vascular territory infarction, hemorrhage, hydrocephalus, extra-axial collection or mass lesion/mass effect. Similar patchy white matter hypodensities, nonspecific but compatible with chronic microvascular ischemic disease. Vascular: No hyperdense vessel identified. Skull: No acute fracture. Sinuses/Orbits: Left sphenoid sinus opacification with surrounding osteitis. Other: No mastoid effusions. ASPECTS [facility] Stroke Program Early CT Score) total score (0-10 with 10 being normal): 10. IMPRESSION: 1. No evidence of acute intracranial abnormality.  ASPECTS is 10. 2. Chronic microvascular ischemic disease. Code stroke imaging results were communicated on 03/05/2022 at 3:39 pm to provider [facility] via secure text paging. Electronically Signed   By: Feliberto Harts M.D.   On: 03/05/2022 15:39   CT HEAD WO CONTRAST  Result Date: 02/21/2022 CLINICAL DATA:  Dizziness, altered mental status. EXAM: CT HEAD WITHOUT CONTRAST TECHNIQUE:  Contiguous axial images were obtained from the base of the skull through the vertex without intravenous contrast. RADIATION DOSE REDUCTION: This exam was performed according to the departmental dose-optimization program which includes automated exposure control, adjustment of the mA and/or kV according to patient size and/or use of iterative reconstruction technique. COMPARISON:  November 21, 2021. FINDINGS: Brain: Mild chronic ischemic white matter disease is noted. No mass effect or midline shift is noted. Ventricular size is within normal limits. There is no evidence of mass lesion, hemorrhage or acute infarction. Vascular: No hyperdense vessel or unexpected calcification. Skull: Normal. Negative for fracture or focal lesion. Sinuses/Orbits: No acute finding. Other: None. IMPRESSION: No acute intracranial abnormality seen. Electronically Signed   By: Lupita Raider M.D.   On: 02/21/2022 11:53      Subjective: Patient seen and examined at bedside.  Poor historian.  Still slow to respond.  No agitation, fever, seizures or vomiting reported.   Discharge Exam: Vitals:   03/14/22 0408 03/14/22 0749  BP: (!) 156/80 (!) 179/72  Pulse: 72 78  Resp: 16 18  Temp: 98.1 F (36.7 C) 98 F (36.7 C)  SpO2: 98% 99%    General: On room air.  No distress.  Chronically ill looking and deconditioned.  Extremely slow to respond.  Poor historian.  Sleepy, wakes up slightly, confused to time. respiratory: Decreased breath sounds at bases bilaterally with some crackles CVS: Currently rate controlled; S1-S2 heard  abdominal: Soft, nontender, slightly distended, no organomegaly; normal bowel sounds are heard  extremities: Trace lower extremity edema; no clubbing.      The results of significant diagnostics from this hospitalization (including imaging, microbiology, ancillary and laboratory) are listed below for reference.     Microbiology: Recent Results (from the past 240 hour(s))  CSF culture w Gram  Stain      Status: None   Collection Time: 03/07/22  3:12 PM   Specimen: PATH Cytology CSF; Cerebrospinal Fluid  Result Value Ref Range Status   Specimen Description CSF  Final   Special Requests NONE  Final   Gram Stain NO WBC SEEN NO ORGANISMS SEEN   Final   Culture   Final    NO GROWTH 3 DAYS Performed at [facility], [address] [place], [place] [address]    Report Status 03/10/2022 FINAL  Final     Labs: BNP (last 3 results) No results for input(s): "BNP" in the last 8760 hours. Basic Metabolic Panel: Recent Labs  Lab 03/08/22 0355 03/09/22 0641 03/10/22 0403 03/12/22 0426 03/13/22 0424  NA 140 139 140 142 141  K 3.4* 3.4* 2.8* 2.9* 4.0  CL 107 104 105 104 107  CO2 21* 16* 22 28 26   GLUCOSE 77 148* 178* 146* 126*  BUN 9 15 14 10 12   CREATININE 0.98 1.07* 0.98 0.77 0.81  CALCIUM 8.7* 8.9 8.5* 8.6* 8.2*  MG  --  1.4* 1.7 1.3* 1.7  PHOS  --  4.2  --   --   --    Liver Function Tests: No results for input(s): "AST", "ALT", "ALKPHOS", "BILITOT", "PROT", "ALBUMIN" in the last 168 hours. No results for input(s): "LIPASE", "AMYLASE" in the last 168 hours. Recent Labs  Lab 03/12/22 0426  AMMONIA 31   CBC: Recent Labs  Lab 03/08/22 0355  WBC 8.0  HGB 9.8*  HCT 27.8*  MCV 89.1  PLT 186   Cardiac Enzymes: No results for input(s): "CKTOTAL", "CKMB", "CKMBINDEX", "TROPONINI" in the last 168 hours. BNP: Invalid input(s): "POCBNP" CBG: Recent Labs  Lab 03/07/22 1706  GLUCAP 89   D-Dimer No results for input(s): "DDIMER" in the last 72 hours. Hgb A1c No results for input(s): "HGBA1C" in the last 72 hours. Lipid Profile No results for input(s): "CHOL", "HDL", "LDLCALC", "TRIG", "CHOLHDL", "LDLDIRECT" in the last 72 hours. Thyroid function studies No results for input(s): "TSH", "T4TOTAL", "T3FREE", "THYROIDAB" in the last 72 hours.  Invalid input(s): "FREET3" Anemia work up No results for input(s): "VITAMINB12", "FOLATE", "FERRITIN", "TIBC", "IRON",  "RETICCTPCT" in the last 72 hours. Urinalysis    Component Value Date/Time   COLORURINE STRAW (A) 11/22/2021 0627   APPEARANCEUR CLEAR 11/22/2021 0627   LABSPEC 1.013 11/22/2021 0627   PHURINE 5.0 11/22/2021 0627   GLUCOSEU NEGATIVE 11/22/2021 0627   HGBUR NEGATIVE 11/22/2021 0627   BILIRUBINUR NEGATIVE 11/22/2021 0627   BILIRUBINUR negative 07/20/2020 1250   KETONESUR 20 (A) 11/22/2021 0627   PROTEINUR NEGATIVE 11/22/2021 0627   UROBILINOGEN 0.2 07/20/2020 1250   UROBILINOGEN 0.2 06/30/2013 1255   NITRITE NEGATIVE 11/22/2021 0627   LEUKOCYTESUR MODERATE (A) 11/22/2021 0627   Sepsis Labs Recent Labs  Lab 03/08/22 0355  WBC 8.0   Microbiology Recent Results (from the past 240 hour(s))  CSF culture w Gram Stain     Status: None   Collection Time: 03/07/22  3:12 PM   Specimen: PATH Cytology CSF; Cerebrospinal Fluid  Result Value Ref Range Status   Specimen Description CSF  Final   Special Requests NONE  Final   Gram Stain NO WBC SEEN NO ORGANISMS SEEN   Final   Culture   Final    NO GROWTH 3 DAYS Performed at [facility], [address] [place], [address] [place]    Report Status 03/10/2022 FINAL  Final  Time coordinating discharge: 35 minutes  SIGNED:   Glade Lloyd, MD  [facility] 03/14/2022, 9:55 AM

## 2022-03-15 DIAGNOSIS — G9340 Encephalopathy, unspecified: Secondary | ICD-10-CM | POA: Diagnosis not present

## 2022-03-15 DIAGNOSIS — N183 Chronic kidney disease, stage 3 unspecified: Secondary | ICD-10-CM | POA: Diagnosis not present

## 2022-03-15 DIAGNOSIS — I10 Essential (primary) hypertension: Secondary | ICD-10-CM | POA: Diagnosis not present

## 2022-03-15 DIAGNOSIS — E785 Hyperlipidemia, unspecified: Secondary | ICD-10-CM | POA: Diagnosis not present

## 2022-03-16 DIAGNOSIS — E1122 Type 2 diabetes mellitus with diabetic chronic kidney disease: Secondary | ICD-10-CM | POA: Diagnosis not present

## 2022-03-16 DIAGNOSIS — R414 Neurologic neglect syndrome: Secondary | ICD-10-CM | POA: Diagnosis not present

## 2022-03-16 DIAGNOSIS — G9340 Encephalopathy, unspecified: Secondary | ICD-10-CM | POA: Diagnosis not present

## 2022-03-16 DIAGNOSIS — R569 Unspecified convulsions: Secondary | ICD-10-CM | POA: Diagnosis not present

## 2022-03-18 DIAGNOSIS — B379 Candidiasis, unspecified: Secondary | ICD-10-CM | POA: Diagnosis not present

## 2022-03-22 DIAGNOSIS — B379 Candidiasis, unspecified: Secondary | ICD-10-CM | POA: Diagnosis not present

## 2022-03-22 DIAGNOSIS — R1311 Dysphagia, oral phase: Secondary | ICD-10-CM | POA: Diagnosis not present

## 2022-03-23 DIAGNOSIS — R569 Unspecified convulsions: Secondary | ICD-10-CM | POA: Diagnosis not present

## 2022-03-23 DIAGNOSIS — G9340 Encephalopathy, unspecified: Secondary | ICD-10-CM | POA: Diagnosis not present

## 2022-03-23 DIAGNOSIS — R414 Neurologic neglect syndrome: Secondary | ICD-10-CM | POA: Diagnosis not present

## 2022-03-23 DIAGNOSIS — E1122 Type 2 diabetes mellitus with diabetic chronic kidney disease: Secondary | ICD-10-CM | POA: Diagnosis not present

## 2022-03-24 LAB — MISC LABCORP TEST (SEND OUT): Labcorp test code: 9985

## 2022-03-28 ENCOUNTER — Ambulatory Visit: Payer: Medicare Other | Admitting: Nurse Practitioner

## 2022-03-29 DIAGNOSIS — B379 Candidiasis, unspecified: Secondary | ICD-10-CM | POA: Diagnosis not present

## 2022-03-29 DIAGNOSIS — R1311 Dysphagia, oral phase: Secondary | ICD-10-CM | POA: Diagnosis not present

## 2022-03-30 ENCOUNTER — Ambulatory Visit: Payer: Medicare Other | Admitting: Family

## 2022-04-01 DIAGNOSIS — R569 Unspecified convulsions: Secondary | ICD-10-CM | POA: Diagnosis not present

## 2022-04-01 DIAGNOSIS — R414 Neurologic neglect syndrome: Secondary | ICD-10-CM | POA: Diagnosis not present

## 2022-04-01 DIAGNOSIS — M6259 Muscle wasting and atrophy, not elsewhere classified, multiple sites: Secondary | ICD-10-CM | POA: Diagnosis not present

## 2022-04-01 DIAGNOSIS — I10 Essential (primary) hypertension: Secondary | ICD-10-CM | POA: Diagnosis not present

## 2022-04-01 DIAGNOSIS — E785 Hyperlipidemia, unspecified: Secondary | ICD-10-CM | POA: Diagnosis not present

## 2022-04-01 DIAGNOSIS — M6281 Muscle weakness (generalized): Secondary | ICD-10-CM | POA: Diagnosis not present

## 2022-04-01 DIAGNOSIS — B379 Candidiasis, unspecified: Secondary | ICD-10-CM | POA: Diagnosis not present

## 2022-04-04 ENCOUNTER — Ambulatory Visit (INDEPENDENT_AMBULATORY_CARE_PROVIDER_SITE_OTHER): Payer: Medicare Other | Admitting: Nurse Practitioner

## 2022-04-04 ENCOUNTER — Encounter: Payer: Self-pay | Admitting: Nurse Practitioner

## 2022-04-04 VITALS — BP 118/76 | HR 100 | Temp 97.9°F

## 2022-04-04 DIAGNOSIS — E1122 Type 2 diabetes mellitus with diabetic chronic kidney disease: Secondary | ICD-10-CM | POA: Diagnosis not present

## 2022-04-04 DIAGNOSIS — E440 Moderate protein-calorie malnutrition: Secondary | ICD-10-CM

## 2022-04-04 DIAGNOSIS — R5381 Other malaise: Secondary | ICD-10-CM

## 2022-04-04 DIAGNOSIS — N1832 Chronic kidney disease, stage 3b: Secondary | ICD-10-CM | POA: Diagnosis not present

## 2022-04-04 DIAGNOSIS — I10 Essential (primary) hypertension: Secondary | ICD-10-CM | POA: Diagnosis not present

## 2022-04-04 DIAGNOSIS — D631 Anemia in chronic kidney disease: Secondary | ICD-10-CM

## 2022-04-04 DIAGNOSIS — N183 Chronic kidney disease, stage 3 unspecified: Secondary | ICD-10-CM

## 2022-04-04 DIAGNOSIS — R569 Unspecified convulsions: Secondary | ICD-10-CM | POA: Diagnosis not present

## 2022-04-04 DIAGNOSIS — R339 Retention of urine, unspecified: Secondary | ICD-10-CM

## 2022-04-04 DIAGNOSIS — R319 Hematuria, unspecified: Secondary | ICD-10-CM

## 2022-04-04 MED ORDER — TAMSULOSIN HCL 0.4 MG PO CAPS: 0.4000 mg | ORAL_CAPSULE | Freq: Every day | ORAL | 0 refills | Status: AC

## 2022-04-04 MED ORDER — LEVETIRACETAM 500 MG PO TABS: 500.0000 mg | ORAL_TABLET | Freq: Two times a day (BID) | ORAL | 0 refills | Status: AC

## 2022-04-04 NOTE — Progress Notes (Signed)
Careteam: Patient Care Team: Lauree Chandler, NP as PCP - General (Geriatric Medicine) Mayford Knife, [facility] (Pharmacist) Warden Fillers, MD as Consulting Physician (Ophthalmology)  PLACE OF SERVICE:  [place] Directive information Does Patient Have a Medical Advance Directive?: No, Would patient like information on creating a medical advance directive?: Yes (MAU/Ambulatory/Procedural Areas - Information given)  Allergies  Allergen Reactions   Morphine And Related Rash and Other (See Comments)    Syncope, also    Chief Complaint  Patient presents with   Follow-up    Post rehab follow-up from [place]. Discuss folic acid, keppra, flomax, and trazodone. Patient with dark urine, daughter questions blood in urine. Patient also c/o pain, not sure exactly where. Here with daughter, plan is to take patient back to [place]. It's undecided if patient will transfer care.      HPI: Patient is a 88 y.o. female for follow up rehab after hospitalization.  She was hospitalized due to seizure vs stroke- it was confirmed that she had a seizure.  It was confirmed by neurology that she had a seizure. She went to [facility] for rehab. Previously she was living alone but now the plan is to move to [place] with  her daughter.  She was in rehab for 3 weeks and was discharged 2 days ago.  Daughter reports they are figuring things out at home.  She went home with DME but did not have physical therapy set up.  She is planning to go to [place] with her daughter tomorrow.  They need home health but somewhere in [place] area.   Daughter concerned about blood in urine. She is incontinent of urine, denies pain with urination.   Pt is not walking or standing, daughter having to transfer her into wheelchair. She is total care at this time. Daughter has to help fed her. She will go to sleep between activities.  She was very active prior to hospitalization.   Review of Systems:   Review of Systems  Constitutional:  Negative for chills, fever and weight loss.  HENT:  Negative for tinnitus.   Respiratory:  Negative for cough, sputum production and shortness of breath.   Cardiovascular:  Negative for chest pain, palpitations and leg swelling.  Gastrointestinal:  Negative for abdominal pain, constipation, diarrhea and heartburn.  Genitourinary:  Negative for dysuria, frequency and urgency.  Musculoskeletal:  Negative for back pain, falls, joint pain and myalgias.  Skin: Negative.   Neurological:  Positive for weakness. Negative for dizziness and headaches.  Psychiatric/Behavioral:  Negative for depression and memory loss. The patient does not have insomnia.     Past Medical History:  Diagnosis Date   Diabetes ([place])    High cholesterol    Hypertension    Polymyalgia rheumatica ([place])    Renal disorder    Past Surgical History:  Procedure Laterality Date   ECTOPIC PREGNANCY SURGERY     HERNIA REPAIR     Social History:   reports that she has never smoked. She has never used smokeless tobacco. She reports that she does not drink alcohol and does not use drugs.  Family History  Problem Relation Age of Onset   Healthy Mother    Heart disease Father    Heart attack Father     Medications: Patient's Medications  New Prescriptions   No medications on file  Previous Medications   ACETAMINOPHEN (TYLENOL) 500 MG TABLET    Take 1,000 mg by mouth every 6 (six) hours as  needed for moderate pain.   ASPIRIN EC 81 MG TABLET    Take 81 mg by mouth every evening.   COENZYME Q10 (COQ10) 100 MG CAPS    Take 100 mg by mouth daily at 12 noon.   FLUTICASONE (FLONASE) 50 MCG/ACT NASAL SPRAY    Place 1 spray into both nostrils daily as needed for allergies or rhinitis.   FOLIC ACID (FOLVITE) 1 MG TABLET    Take 1 tablet (1 mg total) by mouth daily.   GINKGO BILOBA PO    Take 1 tablet by mouth daily.   LOSARTAN (COZAAR) 25 MG TABLET    Take 1 tablet (25 mg total) by mouth  daily.   METOPROLOL TARTRATE (LOPRESSOR) 50 MG TABLET    Take 1 tablet (50 mg total) by mouth 2 (two) times daily.   OMEGA-3 FATTY ACIDS (FISH OIL) 1000 MG CAPS    Take 1,000 mg by mouth daily with breakfast.   PRAVASTATIN (PRAVACHOL) 80 MG TABLET    One tab po M-F, skip weekends  Modified Medications   Modified Medication Previous Medication   LEVETIRACETAM (KEPPRA) 500 MG TABLET levETIRAcetam (KEPPRA) 500 MG tablet      Take 1 tablet (500 mg total) by mouth 2 (two) times daily.    Take 1 tablet (500 mg total) by mouth 2 (two) times daily.   TAMSULOSIN (FLOMAX) 0.4 MG CAPS CAPSULE tamsulosin (FLOMAX) 0.4 MG CAPS capsule      Take 1 capsule (0.4 mg total) by mouth daily after supper.    Take 1 capsule (0.4 mg total) by mouth daily after supper.  Discontinued Medications   TRAZODONE (DESYREL) 50 MG TABLET    Take 0.5 tablets (25 mg total) by mouth at bedtime as needed for sleep.    Physical Exam:  Vitals:   04/04/22 0952  BP: 118/76  Pulse: 100  Temp: 97.9 F (36.6 C)  TempSrc: Temporal  SpO2: 98%   There is no height or weight on file to calculate BMI. Wt Readings from Last 3 Encounters:  03/05/22 111 lb 15.9 oz (50.8 kg)  03/01/22 112 lb (50.8 kg)  02/15/22 118 lb 12.8 oz (53.9 kg)    Physical Exam Constitutional:      General: She is not in acute distress.    Appearance: She is well-developed. She is not diaphoretic.  HENT:     Head: Normocephalic and atraumatic.     Mouth/Throat:     Pharynx: No oropharyngeal exudate.  Eyes:     Conjunctiva/sclera: Conjunctivae normal.     Pupils: Pupils are equal, round, and reactive to light.  Cardiovascular:     Rate and Rhythm: Normal rate and regular rhythm.     Heart sounds: Normal heart sounds.  Pulmonary:     Effort: Pulmonary effort is normal.     Breath sounds: Normal breath sounds.  Abdominal:     General: Bowel sounds are normal.     Palpations: Abdomen is soft.  Musculoskeletal:     Cervical back: Normal range of  motion and neck supple.     Right lower leg: No edema.     Left lower leg: No edema.  Skin:    General: Skin is warm and dry.  Neurological:     Mental Status: She is alert and oriented to person, place, and time.     Motor: Weakness present.     Gait: Gait abnormal.  Psychiatric:        Mood and Affect: Mood normal.  Labs reviewed: Basic Metabolic Panel: Recent Labs    03/06/22 1114 03/07/22 0309 03/08/22 0355 03/09/22 0641 03/10/22 0403 03/12/22 0426 03/13/22 0424  NA 138 134*   < > 139 140 142 141  K 3.3* 3.5   < > 3.4* 2.8* 2.9* 4.0  CL 103 98   < > 104 105 104 107  CO2 22 22   < > 16* _0 GLUCOSE 103* 96   < > 148* 178* 146* 126*  BUN 5* 6*   < > _1 CREATININE 0.92 1.01*   < > 1.07* 0.98 0.77 0.81  CALCIUM 9.1 9.2   < > 8.9 8.5* 8.6* 8.2*  MG 1.4* 2.0  --  1.4* 1.7 1.3* 1.7  PHOS 2.9 3.1  --  4.2  --   --   --   TSH  --  1.467  --   --   --   --   --    < > = values in this interval not displayed.   Liver Function Tests: Recent Labs    11/21/21 1453 12/24/21 1112 02/21/22 1157 03/05/22 1525  AST 34 _2 ALT _3 ALKPHOS 37*  --  43 32*  BILITOT 1.3* 1.2 1.3* 0.8  PROT 9.4* 6.8 8.3* 6.5  ALBUMIN 4.8  --  4.3 3.4*   No results for input(s): "LIPASE", "AMYLASE" in the last 8760 hours. Recent Labs    03/12/22 0426  AMMONIA 31   CBC: Recent Labs    12/24/21 1112 02/21/22 1157 03/05/22 1525 03/05/22 1608 03/08/22 0355  WBC 5.1 7.5 6.6  --  8.0  NEUTROABS 3,473 5.7 5.2  --   --   HGB 10.7* 11.2* 9.1* 8.8* 9.8*  HCT 32.0* 33.6* 26.8* 26.0* 27.8*  MCV 90.9 93.3 92.1  --  89.1  PLT 256 225 204  --  186   Lipid Panel: Recent Labs    07/22/21 1121 11/16/21 1214  CHOL 164 149  HDL 45 36*  LDLCALC 100* 96  TRIG 102 89  CHOLHDL 3.6 4.1   TSH: Recent Labs    03/07/22 0309  TSH 1.467   A1C: Lab Results  Component Value Date   HGBA1C 6.5 (H) 11/16/2021     Assessment/Plan 1. Urinary  retention Urinating well at this time, to continue flomax - tamsulosin (FLOMAX) 0.4 MG CAPS capsule; Take 1 capsule (0.4 mg total) by mouth daily after supper.  Dispense: 30 capsule; Refill: 0  2. Seizure ([place]) -no ongoing seizures, she plans to moved to [place] and will need to establish with neurologist for ongoing followup.  - levETIRAcetam (KEPPRA) 500 MG tablet; Take 1 tablet (500 mg total) by mouth 2 (two) times daily.  Dispense: 60 tablet; Refill: 0  3. Primary hypertension -Blood pressure well controlled, goal bp <140/90 Continue current medications and dietary modifications follow metabolic panel - CBC with Differential/Platelet - CMP with eGFR([facility])  4. Type 2 diabetes mellitus with stage 3 chronic kidney disease, without long-term current use of insulin, unspecified whether stage 3a or 3b CKD ([place]) -not currently requiring medications, she has had ongoing weight loss -routine foot care/monitoring and to keep up with diabetic eye exams through ophthalmology  - Hemoglobin A1c  5. Anemia due to stage 3b chronic kidney disease ([place]) Will follow up las - CBC with Differential/Platelet - CMP with eGFR([facility])  6. Debility -significant debility since hospitalization and seizure. Plans to move with daughter to  [place] for ongoing support. They will need home health Pt/OT there. Daugher will let us know if she needs order sent to [place] and where to send it.   7. Hematuria, unspecified type Daughter thought she saw blood in urine, will get UA to evaluate, unable to collect in office so will get at home   [place]. Rachel, [facility] [phone]

## 2022-04-04 NOTE — Patient Instructions (Signed)
Tylenol 325 mg 1-2 by mouth every 6 hours as needed pain

## 2022-04-05 LAB — CBC WITH DIFFERENTIAL/PLATELET
Absolute Monocytes: 1197 cells/uL — ABNORMAL HIGH (ref 200–950)
Basophils Absolute: 16 cells/uL (ref 0–200)
Basophils Relative: 0.1 %
Eosinophils Absolute: 16 cells/uL (ref 15–500)
Eosinophils Relative: 0.1 %
HCT: 35.4 % (ref 35.0–45.0)
Hemoglobin: 11.9 g/dL (ref 11.7–15.5)
Lymphs Abs: 754 cells/uL — ABNORMAL LOW (ref 850–3900)
MCH: 29.9 pg (ref 27.0–33.0)
MCHC: 33.6 g/dL (ref 32.0–36.0)
MCV: 88.9 fL (ref 80.0–100.0)
MPV: 9.2 fL (ref 7.5–12.5)
Monocytes Relative: 7.3 %
Neutro Abs: 14416 cells/uL — ABNORMAL HIGH (ref 1500–7800)
Neutrophils Relative %: 87.9 %
Platelets: 718 10*3/uL — ABNORMAL HIGH (ref 140–400)
RBC: 3.98 10*6/uL (ref 3.80–5.10)
RDW: 11.9 % (ref 11.0–15.0)
Total Lymphocyte: 4.6 %
WBC: 16.4 10*3/uL — ABNORMAL HIGH (ref 3.8–10.8)

## 2022-04-05 LAB — COMPLETE METABOLIC PANEL WITH GFR
AG Ratio: 0.8 (calc) — ABNORMAL LOW (ref 1.0–2.5)
ALT: 30 U/L — ABNORMAL HIGH (ref 6–29)
AST: 30 U/L (ref 10–35)
Albumin: 3.7 g/dL (ref 3.6–5.1)
Alkaline phosphatase (APISO): 87 U/L (ref 37–153)
BUN: 23 mg/dL (ref 7–25)
CO2: 26 mmol/L (ref 20–32)
Calcium: 10.6 mg/dL — ABNORMAL HIGH (ref 8.6–10.4)
Chloride: 102 mmol/L (ref 98–110)
Creat: 0.88 mg/dL (ref 0.60–0.95)
Globulin: 4.7 g/dL (calc) — ABNORMAL HIGH (ref 1.9–3.7)
Glucose, Bld: 132 mg/dL — ABNORMAL HIGH (ref 65–99)
Potassium: 3.7 mmol/L (ref 3.5–5.3)
Sodium: 143 mmol/L (ref 135–146)
Total Bilirubin: 0.6 mg/dL (ref 0.2–1.2)
Total Protein: 8.4 g/dL — ABNORMAL HIGH (ref 6.1–8.1)
eGFR: 64 mL/min/{1.73_m2} (ref >=60)

## 2022-04-05 LAB — HEMOGLOBIN A1C
Hgb A1c MFr Bld: 6.3 % of total Hgb — ABNORMAL HIGH (ref <5.7)
Mean Plasma Glucose: 134 mg/dL
eAG (mmol/L): 7.4 mmol/L

## 2022-04-08 ENCOUNTER — Telehealth: Payer: Self-pay

## 2022-04-08 DIAGNOSIS — R569 Unspecified convulsions: Secondary | ICD-10-CM

## 2022-04-08 DIAGNOSIS — E440 Moderate protein-calorie malnutrition: Secondary | ICD-10-CM

## 2022-04-08 NOTE — Telephone Encounter (Signed)
Message sent to referral coordinator

## 2022-04-08 NOTE — Telephone Encounter (Signed)
Referral placed, please reach out to referral coordinator and let her know this is urgent

## 2022-04-08 NOTE — Telephone Encounter (Signed)
Would recommend taking her to the hospital for further evaluation or we can call hospice in if she wants to avoid the hospital and focus on comfort care

## 2022-04-08 NOTE — Telephone Encounter (Signed)
Patients daughter Rebekah Avila) called to report change in patient status. She states after visit on Monday, 04/04/22 patient stopped eating, not taking her medications, sleeping all day and she have to wake her up to take sips of drinks. She report that they supposed to move back to her place in [place] but have not been able to because she feels patient is declining in health. She would like to know what Wyatt Mage think she should do for the patient. Please advise

## 2022-04-08 NOTE — Telephone Encounter (Signed)
Patient's daughter(Gail) was advised and stated to move forward with hospice to focus on comfort care. She was advised that a order will be placed and someone will reach out to her. Dondra Spry would like her number to be the contact number for hospice to call [phone].

## 2022-04-08 NOTE — Addendum Note (Signed)
Addended by: Sharon Seller on: 04/08/2022 01:04 PM   Modules accepted: Orders

## 2022-04-25 DEATH — deceased

## 2022-04-29 ENCOUNTER — Ambulatory Visit: Payer: Medicare Other | Admitting: Nurse Practitioner

## 2022-05-02 ENCOUNTER — Encounter: Payer: Medicare Other | Admitting: Nurse Practitioner

## 2022-05-04 ENCOUNTER — Ambulatory Visit: Payer: Medicare Other | Admitting: Neurology

## 2022-05-06 ENCOUNTER — Ambulatory Visit: Payer: Medicare Other | Admitting: Nurse Practitioner

## 2022-05-09 ENCOUNTER — Encounter: Payer: Medicare Other | Admitting: Nurse Practitioner

## 2022-05-13 ENCOUNTER — Other Ambulatory Visit: Payer: Self-pay | Admitting: Family

## 2022-05-13 DIAGNOSIS — I10 Essential (primary) hypertension: Secondary | ICD-10-CM

## 2022-05-25 ENCOUNTER — Ambulatory Visit: Payer: Medicare Other

## 2022-07-27 ENCOUNTER — Encounter: Payer: Medicare Other | Admitting: Nurse Practitioner
